# Patient Record
Sex: Male | Born: 1950 | Race: Black or African American | Hispanic: No | Marital: Married | Smoking: Current some day smoker
Health system: Southern US, Community
[De-identification: ages and names within clinical notes are randomized; demographics above are authoritative.]

## PROBLEM LIST (undated history)

## (undated) VITALS — BP 92/68 | HR 99 | Temp 97.0°F | Resp 18 | Ht 67.0 in | Wt 126.0 lb

## (undated) DIAGNOSIS — D649 Anemia, unspecified: Secondary | ICD-10-CM

## (undated) DIAGNOSIS — B1920 Unspecified viral hepatitis C without hepatic coma: Secondary | ICD-10-CM

## (undated) DIAGNOSIS — Z720 Tobacco use: Secondary | ICD-10-CM

## (undated) DIAGNOSIS — F1020 Alcohol dependence, uncomplicated: Secondary | ICD-10-CM

## (undated) DIAGNOSIS — J449 Chronic obstructive pulmonary disease, unspecified: Secondary | ICD-10-CM

## (undated) DIAGNOSIS — I739 Peripheral vascular disease, unspecified: Secondary | ICD-10-CM

## (undated) DIAGNOSIS — I2583 Coronary atherosclerosis due to lipid rich plaque: Secondary | ICD-10-CM

## (undated) DIAGNOSIS — I2510 Atherosclerotic heart disease of native coronary artery without angina pectoris: Secondary | ICD-10-CM

## (undated) DIAGNOSIS — F32A Depression, unspecified: Secondary | ICD-10-CM

## (undated) DIAGNOSIS — K7460 Unspecified cirrhosis of liver: Secondary | ICD-10-CM

## (undated) DIAGNOSIS — M1990 Unspecified osteoarthritis, unspecified site: Secondary | ICD-10-CM

## (undated) DIAGNOSIS — F329 Major depressive disorder, single episode, unspecified: Secondary | ICD-10-CM

## (undated) DIAGNOSIS — R0600 Dyspnea, unspecified: Secondary | ICD-10-CM

## (undated) DIAGNOSIS — K219 Gastro-esophageal reflux disease without esophagitis: Secondary | ICD-10-CM

## (undated) HISTORY — DX: Gastro-esophageal reflux disease without esophagitis: K21.9

## (undated) HISTORY — DX: Major depressive disorder, single episode, unspecified: F32.9

## (undated) HISTORY — PX: CARDIAC CATHETERIZATION: SHX172

## (undated) HISTORY — DX: Anemia, unspecified: D64.9

## (undated) HISTORY — DX: Tobacco use: Z72.0

## (undated) HISTORY — DX: Unspecified osteoarthritis, unspecified site: M19.90

## (undated) HISTORY — DX: Unspecified viral hepatitis C without hepatic coma: B19.20

## (undated) HISTORY — PX: COLONOSCOPY: SHX174

## (undated) HISTORY — DX: Atherosclerotic heart disease of native coronary artery without angina pectoris: I25.10

## (undated) HISTORY — DX: Depression, unspecified: F32.A

## (undated) HISTORY — DX: Chronic obstructive pulmonary disease, unspecified: J44.9

## (undated) HISTORY — DX: Coronary atherosclerosis due to lipid rich plaque: I25.83

---

## 2000-04-01 ENCOUNTER — Encounter: Payer: Self-pay | Admitting: Emergency Medicine

## 2000-04-01 ENCOUNTER — Emergency Department (HOSPITAL_COMMUNITY): Admission: EM | Admit: 2000-04-01 | Discharge: 2000-04-01 | Payer: Self-pay | Admitting: Emergency Medicine

## 2000-06-07 ENCOUNTER — Emergency Department (HOSPITAL_COMMUNITY): Admission: EM | Admit: 2000-06-07 | Discharge: 2000-06-07 | Payer: Self-pay | Admitting: Emergency Medicine

## 2000-06-07 ENCOUNTER — Encounter: Payer: Self-pay | Admitting: Emergency Medicine

## 2000-06-09 ENCOUNTER — Encounter: Admission: RE | Admit: 2000-06-09 | Discharge: 2000-06-09 | Payer: Self-pay | Admitting: Hematology and Oncology

## 2002-11-07 ENCOUNTER — Encounter: Payer: Self-pay | Admitting: Emergency Medicine

## 2002-11-07 ENCOUNTER — Emergency Department (HOSPITAL_COMMUNITY): Admission: EM | Admit: 2002-11-07 | Discharge: 2002-11-07 | Payer: Self-pay | Admitting: Emergency Medicine

## 2003-09-19 ENCOUNTER — Emergency Department (HOSPITAL_COMMUNITY): Admission: EM | Admit: 2003-09-19 | Discharge: 2003-09-19 | Payer: Self-pay | Admitting: Emergency Medicine

## 2004-02-28 ENCOUNTER — Emergency Department (HOSPITAL_COMMUNITY): Admission: EM | Admit: 2004-02-28 | Discharge: 2004-02-28 | Payer: Self-pay | Admitting: Emergency Medicine

## 2004-03-26 ENCOUNTER — Emergency Department (HOSPITAL_COMMUNITY): Admission: EM | Admit: 2004-03-26 | Discharge: 2004-03-27 | Payer: Self-pay | Admitting: Emergency Medicine

## 2006-06-13 ENCOUNTER — Emergency Department (HOSPITAL_COMMUNITY): Admission: EM | Admit: 2006-06-13 | Discharge: 2006-06-13 | Payer: Self-pay | Admitting: Emergency Medicine

## 2006-06-16 ENCOUNTER — Emergency Department (HOSPITAL_COMMUNITY): Admission: EM | Admit: 2006-06-16 | Discharge: 2006-06-16 | Payer: Self-pay | Admitting: Emergency Medicine

## 2006-10-06 ENCOUNTER — Emergency Department (HOSPITAL_COMMUNITY): Admission: EM | Admit: 2006-10-06 | Discharge: 2006-10-06 | Payer: Self-pay | Admitting: Emergency Medicine

## 2008-04-14 HISTORY — PX: CORONARY ARTERY BYPASS GRAFT: SHX141

## 2008-04-19 ENCOUNTER — Emergency Department (HOSPITAL_COMMUNITY): Admission: EM | Admit: 2008-04-19 | Discharge: 2008-04-19 | Payer: Self-pay | Admitting: Emergency Medicine

## 2008-04-28 ENCOUNTER — Ambulatory Visit: Payer: Self-pay | Admitting: Cardiothoracic Surgery

## 2008-04-28 ENCOUNTER — Inpatient Hospital Stay (HOSPITAL_COMMUNITY): Admission: EM | Admit: 2008-04-28 | Discharge: 2008-05-13 | Payer: Self-pay | Admitting: Cardiology

## 2008-05-03 ENCOUNTER — Ambulatory Visit: Payer: Self-pay | Admitting: Internal Medicine

## 2008-05-05 ENCOUNTER — Encounter: Payer: Self-pay | Admitting: Cardiothoracic Surgery

## 2008-06-02 ENCOUNTER — Ambulatory Visit: Payer: Self-pay | Admitting: Cardiothoracic Surgery

## 2008-06-02 ENCOUNTER — Encounter: Admission: RE | Admit: 2008-06-02 | Discharge: 2008-06-02 | Payer: Self-pay | Admitting: Cardiothoracic Surgery

## 2008-07-19 ENCOUNTER — Ambulatory Visit: Payer: Self-pay | Admitting: Cardiothoracic Surgery

## 2008-08-04 ENCOUNTER — Ambulatory Visit: Payer: Self-pay | Admitting: Cardiothoracic Surgery

## 2008-09-07 ENCOUNTER — Ambulatory Visit (HOSPITAL_COMMUNITY): Admission: RE | Admit: 2008-09-07 | Discharge: 2008-09-07 | Payer: Self-pay | Admitting: Cardiology

## 2009-03-28 ENCOUNTER — Emergency Department (HOSPITAL_COMMUNITY): Admission: EM | Admit: 2009-03-28 | Discharge: 2009-03-28 | Payer: Self-pay | Admitting: Emergency Medicine

## 2009-04-14 HISTORY — PX: DOPPLER ECHOCARDIOGRAPHY: SHX263

## 2009-05-07 ENCOUNTER — Inpatient Hospital Stay (HOSPITAL_COMMUNITY): Admission: EM | Admit: 2009-05-07 | Discharge: 2009-05-09 | Payer: Self-pay | Admitting: Emergency Medicine

## 2009-05-08 ENCOUNTER — Encounter (INDEPENDENT_AMBULATORY_CARE_PROVIDER_SITE_OTHER): Payer: Self-pay | Admitting: Internal Medicine

## 2009-07-08 ENCOUNTER — Emergency Department (HOSPITAL_COMMUNITY): Admission: EM | Admit: 2009-07-08 | Discharge: 2009-07-08 | Payer: Self-pay | Admitting: Emergency Medicine

## 2009-10-18 ENCOUNTER — Emergency Department (HOSPITAL_COMMUNITY): Admission: EM | Admit: 2009-10-18 | Discharge: 2009-10-18 | Payer: Self-pay | Admitting: Emergency Medicine

## 2009-11-14 ENCOUNTER — Inpatient Hospital Stay (HOSPITAL_COMMUNITY): Admission: EM | Admit: 2009-11-14 | Discharge: 2009-11-16 | Payer: Self-pay | Admitting: Emergency Medicine

## 2009-11-14 ENCOUNTER — Ambulatory Visit: Payer: Self-pay | Admitting: Internal Medicine

## 2009-11-15 ENCOUNTER — Ambulatory Visit: Payer: Self-pay | Admitting: Surgery

## 2009-11-15 ENCOUNTER — Encounter (INDEPENDENT_AMBULATORY_CARE_PROVIDER_SITE_OTHER): Payer: Self-pay | Admitting: Internal Medicine

## 2009-11-15 ENCOUNTER — Ambulatory Visit: Payer: Self-pay | Admitting: Cardiology

## 2009-11-19 LAB — CBC WITH DIFFERENTIAL/PLATELET
BASO%: 0.7 % (ref 0.0–2.0)
Basophils Absolute: 0 10*3/uL (ref 0.0–0.1)
EOS%: 4 % (ref 0.0–7.0)
Eosinophils Absolute: 0.2 10*3/uL (ref 0.0–0.5)
HCT: 36.6 % — ABNORMAL LOW (ref 38.4–49.9)
HGB: 12.9 g/dL — ABNORMAL LOW (ref 13.0–17.1)
LYMPH%: 36.7 % (ref 14.0–49.0)
MCH: 39.2 pg — ABNORMAL HIGH (ref 27.2–33.4)
MCHC: 35.1 g/dL (ref 32.0–36.0)
MCV: 111.5 fL — ABNORMAL HIGH (ref 79.3–98.0)
MONO#: 0.6 10*3/uL (ref 0.1–0.9)
MONO%: 13.9 % (ref 0.0–14.0)
NEUT#: 1.9 10*3/uL (ref 1.5–6.5)
NEUT%: 44.7 % (ref 39.0–75.0)
Platelets: 52 10*3/uL — ABNORMAL LOW (ref 140–400)
RBC: 3.28 10*6/uL — ABNORMAL LOW (ref 4.20–5.82)
RDW: 13.1 % (ref 11.0–14.6)
WBC: 4.3 10*3/uL (ref 4.0–10.3)
lymph#: 1.6 10*3/uL (ref 0.9–3.3)

## 2009-11-21 LAB — COMPREHENSIVE METABOLIC PANEL
ALT: 54 U/L — ABNORMAL HIGH (ref 0–53)
AST: 84 U/L — ABNORMAL HIGH (ref 0–37)
Albumin: 3.9 g/dL (ref 3.5–5.2)
Alkaline Phosphatase: 139 U/L — ABNORMAL HIGH (ref 39–117)
BUN: 6 mg/dL (ref 6–23)
CO2: 26 mEq/L (ref 19–32)
Calcium: 9.5 mg/dL (ref 8.4–10.5)
Chloride: 103 mEq/L (ref 96–112)
Creatinine, Ser: 0.7 mg/dL (ref 0.40–1.50)
Glucose, Bld: 92 mg/dL (ref 70–99)
Potassium: 4.3 mEq/L (ref 3.5–5.3)
Sodium: 138 mEq/L (ref 135–145)
Total Bilirubin: 1.1 mg/dL (ref 0.3–1.2)
Total Protein: 7.6 g/dL (ref 6.0–8.3)

## 2009-11-21 LAB — FOLATE: Folate: >20 ng/mL

## 2009-11-21 LAB — PROTEIN ELECTROPHORESIS, SERUM
Albumin ELP: 48.4 % — ABNORMAL LOW (ref 55.8–66.1)
Alpha-1-Globulin: 4 % (ref 2.9–4.9)
Alpha-2-Globulin: 11.6 % (ref 7.1–11.8)
Beta 2: 5.7 % (ref 3.2–6.5)
Beta Globulin: 5.1 % (ref 4.7–7.2)
Gamma Globulin: 25.2 % — ABNORMAL HIGH (ref 11.1–18.8)
Total Protein, Serum Electrophoresis: 7.6 g/dL (ref 6.0–8.3)

## 2009-11-21 LAB — IRON AND TIBC
%SAT: 47 % (ref 20–55)
Iron: 140 ug/dL (ref 42–165)
TIBC: 295 ug/dL (ref 215–435)
UIBC: 155 ug/dL

## 2009-11-21 LAB — LACTATE DEHYDROGENASE: LDH: 195 U/L (ref 94–250)

## 2009-11-21 LAB — FERRITIN: Ferritin: 1084 ng/mL — ABNORMAL HIGH (ref 22–322)

## 2009-11-21 LAB — VITAMIN B12: Vitamin B-12: 566 pg/mL (ref 211–911)

## 2010-03-14 ENCOUNTER — Ambulatory Visit: Payer: Self-pay | Admitting: Gastroenterology

## 2010-04-14 HISTORY — PX: NM MYOVIEW LTD: HXRAD82

## 2010-04-18 ENCOUNTER — Ambulatory Visit
Admission: RE | Admit: 2010-04-18 | Discharge: 2010-04-18 | Payer: Self-pay | Source: Home / Self Care | Attending: Gastroenterology | Admitting: Gastroenterology

## 2010-04-18 DIAGNOSIS — B182 Chronic viral hepatitis C: Secondary | ICD-10-CM

## 2010-04-18 DIAGNOSIS — Z23 Encounter for immunization: Secondary | ICD-10-CM

## 2010-04-18 DIAGNOSIS — K7030 Alcoholic cirrhosis of liver without ascites: Secondary | ICD-10-CM

## 2010-05-20 ENCOUNTER — Ambulatory Visit
Admission: RE | Admit: 2010-05-20 | Discharge: 2010-05-20 | Disposition: A | Discharge status: Home / Self Care | Payer: Self-pay | Source: Ambulatory Visit | Attending: Gastroenterology | Admitting: Gastroenterology

## 2010-05-20 ENCOUNTER — Other Ambulatory Visit: Payer: Self-pay | Admitting: Gastroenterology

## 2010-05-20 DIAGNOSIS — K7460 Unspecified cirrhosis of liver: Secondary | ICD-10-CM

## 2010-05-20 DIAGNOSIS — B1920 Unspecified viral hepatitis C without hepatic coma: Secondary | ICD-10-CM

## 2010-05-20 MED ORDER — GADOBENATE DIMEGLUMINE 529 MG/ML IV SOLN
14.0000 mL | Freq: Once | INTRAVENOUS | Status: AC | PRN
  Administered 2010-05-20: 14 mL via INTRAVENOUS

## 2010-06-28 LAB — GAMMA GT: GGT: 277 U/L — ABNORMAL HIGH (ref 7–51)

## 2010-06-28 LAB — DIFFERENTIAL
Basophils Absolute: 0 10*3/uL (ref 0.0–0.1)
Basophils Absolute: 0 10*3/uL (ref 0.0–0.1)
Basophils Relative: 0 % (ref 0–1)
Basophils Relative: 1 % (ref 0–1)
Eosinophils Absolute: 0.1 10*3/uL (ref 0.0–0.7)
Eosinophils Absolute: 0.1 10*3/uL (ref 0.0–0.7)
Eosinophils Relative: 3 % (ref 0–5)
Eosinophils Relative: 4 % (ref 0–5)
Lymphocytes Relative: 40 % (ref 12–46)
Lymphocytes Relative: 44 % (ref 12–46)
Lymphs Abs: 1.4 10*3/uL (ref 0.7–4.0)
Lymphs Abs: 1.6 10*3/uL (ref 0.7–4.0)
Monocytes Absolute: 0.4 10*3/uL (ref 0.1–1.0)
Monocytes Absolute: 0.4 10*3/uL (ref 0.1–1.0)
Monocytes Relative: 12 % (ref 3–12)
Monocytes Relative: 12 % (ref 3–12)
Neutro Abs: 1.4 10*3/uL — ABNORMAL LOW (ref 1.7–7.7)
Neutro Abs: 1.5 10*3/uL — ABNORMAL LOW (ref 1.7–7.7)
Neutrophils Relative %: 40 % — ABNORMAL LOW (ref 43–77)
Neutrophils Relative %: 44 % (ref 43–77)

## 2010-06-28 LAB — COMPREHENSIVE METABOLIC PANEL
ALT: 50 U/L (ref 0–53)
ALT: 53 U/L (ref 0–53)
AST: 89 U/L — ABNORMAL HIGH (ref 0–37)
AST: 99 U/L — ABNORMAL HIGH (ref 0–37)
Albumin: 2.8 g/dL — ABNORMAL LOW (ref 3.5–5.2)
Albumin: 2.9 g/dL — ABNORMAL LOW (ref 3.5–5.2)
Alkaline Phosphatase: 107 U/L (ref 39–117)
Alkaline Phosphatase: 122 U/L — ABNORMAL HIGH (ref 39–117)
BUN: 2 mg/dL — ABNORMAL LOW (ref 6–23)
BUN: 3 mg/dL — ABNORMAL LOW (ref 6–23)
CO2: 27 mEq/L (ref 19–32)
CO2: 28 mEq/L (ref 19–32)
Calcium: 8.4 mg/dL (ref 8.4–10.5)
Calcium: 8.4 mg/dL (ref 8.4–10.5)
Chloride: 100 mEq/L (ref 96–112)
Chloride: 105 mEq/L (ref 96–112)
Creatinine, Ser: 0.63 mg/dL (ref 0.4–1.5)
Creatinine, Ser: 0.63 mg/dL (ref 0.4–1.5)
GFR calc Af Amer: >60 mL/min (ref >60)
GFR calc Af Amer: >60 mL/min (ref >60)
GFR calc non Af Amer: >60 mL/min (ref >60)
GFR calc non Af Amer: >60 mL/min (ref >60)
Glucose, Bld: 109 mg/dL — ABNORMAL HIGH (ref 70–99)
Glucose, Bld: 111 mg/dL — ABNORMAL HIGH (ref 70–99)
Potassium: 4 mEq/L (ref 3.5–5.1)
Potassium: 4.1 mEq/L (ref 3.5–5.1)
Sodium: 134 mEq/L — ABNORMAL LOW (ref 135–145)
Sodium: 138 mEq/L (ref 135–145)
Total Bilirubin: 1.2 mg/dL (ref 0.3–1.2)
Total Bilirubin: 1.3 mg/dL — ABNORMAL HIGH (ref 0.3–1.2)
Total Protein: 6.5 g/dL (ref 6.0–8.3)
Total Protein: 6.7 g/dL (ref 6.0–8.3)

## 2010-06-28 LAB — MAGNESIUM
Magnesium: 1.1 mg/dL — ABNORMAL LOW (ref 1.5–2.5)
Magnesium: 1.6 mg/dL (ref 1.5–2.5)

## 2010-06-28 LAB — CARDIAC PANEL(CRET KIN+CKTOT+MB+TROPI)
CK, MB: 1.2 ng/mL (ref 0.3–4.0)
CK, MB: 1.2 ng/mL (ref 0.3–4.0)
Relative Index: 1 (ref 0.0–2.5)
Relative Index: 1 (ref 0.0–2.5)
Total CK: 116 U/L (ref 7–232)
Total CK: 125 U/L (ref 7–232)
Troponin I: 0.01 ng/mL (ref 0.00–0.06)
Troponin I: 0.01 ng/mL (ref 0.00–0.06)

## 2010-06-28 LAB — BASIC METABOLIC PANEL
BUN: 2 mg/dL — ABNORMAL LOW (ref 6–23)
CO2: 23 mEq/L (ref 19–32)
Calcium: 8.7 mg/dL (ref 8.4–10.5)
Chloride: 103 mEq/L (ref 96–112)
Creatinine, Ser: 0.62 mg/dL (ref 0.4–1.5)
GFR calc Af Amer: >60 mL/min (ref >60)
GFR calc non Af Amer: >60 mL/min (ref >60)
Glucose, Bld: 105 mg/dL — ABNORMAL HIGH (ref 70–99)
Potassium: 3.4 mEq/L — ABNORMAL LOW (ref 3.5–5.1)
Sodium: 136 mEq/L (ref 135–145)

## 2010-06-28 LAB — CBC
HCT: 34.4 % — ABNORMAL LOW (ref 39.0–52.0)
HCT: 36.2 % — ABNORMAL LOW (ref 39.0–52.0)
HCT: 39.2 % (ref 39.0–52.0)
Hemoglobin: 12.4 g/dL — ABNORMAL LOW (ref 13.0–17.0)
Hemoglobin: 12.8 g/dL — ABNORMAL LOW (ref 13.0–17.0)
Hemoglobin: 14 g/dL (ref 13.0–17.0)
MCH: 37.6 pg — ABNORMAL HIGH (ref 26.0–34.0)
MCH: 37.7 pg — ABNORMAL HIGH (ref 26.0–34.0)
MCH: 38 pg — ABNORMAL HIGH (ref 26.0–34.0)
MCHC: 35.4 g/dL (ref 30.0–36.0)
MCHC: 35.7 g/dL (ref 30.0–36.0)
MCHC: 36 g/dL (ref 30.0–36.0)
MCV: 104.6 fL — ABNORMAL HIGH (ref 78.0–100.0)
MCV: 105.4 fL — ABNORMAL HIGH (ref 78.0–100.0)
MCV: 107.4 fL — ABNORMAL HIGH (ref 78.0–100.0)
Platelets: 45 10*3/uL — ABNORMAL LOW (ref 150–400)
Platelets: 47 10*3/uL — ABNORMAL LOW (ref 150–400)
Platelets: 59 10*3/uL — ABNORMAL LOW (ref 150–400)
RBC: 3.29 MIL/uL — ABNORMAL LOW (ref 4.22–5.81)
RBC: 3.37 MIL/uL — ABNORMAL LOW (ref 4.22–5.81)
RBC: 3.72 MIL/uL — ABNORMAL LOW (ref 4.22–5.81)
RDW: 12.2 % (ref 11.5–15.5)
RDW: 12.7 % (ref 11.5–15.5)
RDW: 12.7 % (ref 11.5–15.5)
WBC: 3 10*3/uL — ABNORMAL LOW (ref 4.0–10.5)
WBC: 3.4 10*3/uL — ABNORMAL LOW (ref 4.0–10.5)
WBC: 3.5 10*3/uL — ABNORMAL LOW (ref 4.0–10.5)

## 2010-06-28 LAB — HEPATIC FUNCTION PANEL
ALT: 62 U/L — ABNORMAL HIGH (ref 0–53)
AST: 125 U/L — ABNORMAL HIGH (ref 0–37)
Albumin: 3.1 g/dL — ABNORMAL LOW (ref 3.5–5.2)
Alkaline Phosphatase: 111 U/L (ref 39–117)
Bilirubin, Direct: 0.5 mg/dL — ABNORMAL HIGH (ref 0.0–0.3)
Indirect Bilirubin: 1.1 mg/dL — ABNORMAL HIGH (ref 0.3–0.9)
Total Bilirubin: 1.6 mg/dL — ABNORMAL HIGH (ref 0.3–1.2)
Total Protein: 7.1 g/dL (ref 6.0–8.3)

## 2010-06-28 LAB — CK TOTAL AND CKMB (NOT AT ARMC)
CK, MB: 1.2 ng/mL (ref 0.3–4.0)
Relative Index: 0.8 (ref 0.0–2.5)
Total CK: 155 U/L (ref 7–232)

## 2010-06-28 LAB — VITAMIN B12: Vitamin B-12: 541 pg/mL (ref 211–911)

## 2010-06-28 LAB — TSH: TSH: 1.386 u[IU]/mL (ref 0.350–4.500)

## 2010-06-28 LAB — ETHANOL: Alcohol, Ethyl (B): 175 mg/dL — ABNORMAL HIGH (ref 0–10)

## 2010-06-28 LAB — TROPONIN I: Troponin I: 0.01 ng/mL (ref 0.00–0.06)

## 2010-06-28 LAB — T4, FREE: Free T4: 0.83 ng/dL (ref 0.80–1.80)

## 2010-06-28 LAB — FOLATE RBC: RBC Folate: 636 ng/mL — ABNORMAL HIGH (ref 180–600)

## 2010-06-28 LAB — T3, FREE: T3, Free: 2.8 pg/mL (ref 2.3–4.2)

## 2010-06-30 LAB — CBC
HCT: 37.7 % — ABNORMAL LOW (ref 39.0–52.0)
HCT: 38.1 % — ABNORMAL LOW (ref 39.0–52.0)
HCT: 38.6 % — ABNORMAL LOW (ref 39.0–52.0)
HCT: 40 % (ref 39.0–52.0)
Hemoglobin: 13.2 g/dL (ref 13.0–17.0)
Hemoglobin: 13.4 g/dL (ref 13.0–17.0)
Hemoglobin: 13.6 g/dL (ref 13.0–17.0)
Hemoglobin: 14.4 g/dL (ref 13.0–17.0)
MCH: 38.6 pg — ABNORMAL HIGH (ref 26.0–34.0)
MCHC: 35 g/dL (ref 30.0–36.0)
MCHC: 34.8 g/dL (ref 30.0–36.0)
MCHC: 35.7 g/dL (ref 30.0–36.0)
MCHC: 36 g/dL (ref 30.0–36.0)
MCV: 110.4 fL — ABNORMAL HIGH (ref 78.0–100.0)
MCV: 105.3 fL — ABNORMAL HIGH (ref 78.0–100.0)
MCV: 105.9 fL — ABNORMAL HIGH (ref 78.0–100.0)
MCV: 108.6 fL — ABNORMAL HIGH (ref 78.0–100.0)
Platelets: 42 10*3/uL — ABNORMAL LOW (ref 150–400)
Platelets: 36 10*3/uL — ABNORMAL LOW (ref 150–400)
Platelets: 37 10*3/uL — ABNORMAL LOW (ref 150–400)
Platelets: 39 10*3/uL — ABNORMAL LOW (ref 150–400)
RBC: 3.41 MIL/uL — ABNORMAL LOW (ref 4.22–5.81)
RBC: 3.56 MIL/uL — ABNORMAL LOW (ref 4.22–5.81)
RBC: 3.6 MIL/uL — ABNORMAL LOW (ref 4.22–5.81)
RBC: 3.79 MIL/uL — ABNORMAL LOW (ref 4.22–5.81)
RDW: 13.8 % (ref 11.5–15.5)
RDW: 12.9 % (ref 11.5–15.5)
RDW: 13 % (ref 11.5–15.5)
RDW: 13 % (ref 11.5–15.5)
WBC: 3.4 10*3/uL — ABNORMAL LOW (ref 4.0–10.5)
WBC: 3.6 10*3/uL — ABNORMAL LOW (ref 4.0–10.5)
WBC: 3.6 10*3/uL — ABNORMAL LOW (ref 4.0–10.5)
WBC: 3.7 10*3/uL — ABNORMAL LOW (ref 4.0–10.5)

## 2010-06-30 LAB — BASIC METABOLIC PANEL
BUN: 1 mg/dL — ABNORMAL LOW (ref 6–23)
BUN: 1 mg/dL — ABNORMAL LOW (ref 6–23)
BUN: 3 mg/dL — ABNORMAL LOW (ref 6–23)
CO2: 22 mEq/L (ref 19–32)
CO2: 24 mEq/L (ref 19–32)
CO2: 28 mEq/L (ref 19–32)
Calcium: 8.4 mg/dL (ref 8.4–10.5)
Calcium: 7.9 mg/dL — ABNORMAL LOW (ref 8.4–10.5)
Calcium: 8.1 mg/dL — ABNORMAL LOW (ref 8.4–10.5)
Chloride: 103 mEq/L (ref 96–112)
Chloride: 101 mEq/L (ref 96–112)
Chloride: 103 mEq/L (ref 96–112)
Creatinine, Ser: 0.67 mg/dL (ref 0.4–1.5)
Creatinine, Ser: 0.68 mg/dL (ref 0.4–1.5)
Creatinine, Ser: 0.74 mg/dL (ref 0.4–1.5)
GFR calc Af Amer: >60 mL/min (ref >60)
GFR calc Af Amer: >60 mL/min (ref >60)
GFR calc Af Amer: >60 mL/min (ref >60)
GFR calc non Af Amer: >60 mL/min (ref >60)
GFR calc non Af Amer: >60 mL/min (ref >60)
GFR calc non Af Amer: >60 mL/min (ref >60)
Glucose, Bld: 109 mg/dL — ABNORMAL HIGH (ref 70–99)
Glucose, Bld: 107 mg/dL — ABNORMAL HIGH (ref 70–99)
Glucose, Bld: 119 mg/dL — ABNORMAL HIGH (ref 70–99)
Potassium: 3.4 mEq/L — ABNORMAL LOW (ref 3.5–5.1)
Potassium: 3.1 mEq/L — ABNORMAL LOW (ref 3.5–5.1)
Potassium: 3.7 mEq/L (ref 3.5–5.1)
Sodium: 136 mEq/L (ref 135–145)
Sodium: 134 mEq/L — ABNORMAL LOW (ref 135–145)
Sodium: 137 mEq/L (ref 135–145)

## 2010-06-30 LAB — DIFFERENTIAL
Basophils Absolute: 0 10*3/uL (ref 0.0–0.1)
Basophils Absolute: 0 10*3/uL (ref 0.0–0.1)
Basophils Absolute: 0 10*3/uL (ref 0.0–0.1)
Basophils Relative: 1 % (ref 0–1)
Basophils Relative: 0 % (ref 0–1)
Basophils Relative: 0 % (ref 0–1)
Eosinophils Absolute: 0.1 10*3/uL (ref 0.0–0.7)
Eosinophils Absolute: 0 10*3/uL (ref 0.0–0.7)
Eosinophils Absolute: 0.1 10*3/uL (ref 0.0–0.7)
Eosinophils Relative: 4 % (ref 0–5)
Eosinophils Relative: 1 % (ref 0–5)
Eosinophils Relative: 3 % (ref 0–5)
Lymphocytes Relative: 37 % (ref 12–46)
Lymphocytes Relative: 35 % (ref 12–46)
Lymphocytes Relative: 40 % (ref 12–46)
Lymphs Abs: 1.3 10*3/uL (ref 0.7–4.0)
Lymphs Abs: 1.3 10*3/uL (ref 0.7–4.0)
Lymphs Abs: 1.4 10*3/uL (ref 0.7–4.0)
Monocytes Absolute: 0.3 10*3/uL (ref 0.1–1.0)
Monocytes Absolute: 0.4 10*3/uL (ref 0.1–1.0)
Monocytes Absolute: 0.5 10*3/uL (ref 0.1–1.0)
Monocytes Relative: 10 % (ref 3–12)
Monocytes Relative: 12 % (ref 3–12)
Monocytes Relative: 13 % — ABNORMAL HIGH (ref 3–12)
Neutro Abs: 1.7 10*3/uL (ref 1.7–7.7)
Neutro Abs: 1.6 10*3/uL — ABNORMAL LOW (ref 1.7–7.7)
Neutro Abs: 1.9 10*3/uL (ref 1.7–7.7)
Neutrophils Relative %: 48 % (ref 43–77)
Neutrophils Relative %: 44 % (ref 43–77)
Neutrophils Relative %: 51 % (ref 43–77)

## 2010-06-30 LAB — CARDIAC PANEL(CRET KIN+CKTOT+MB+TROPI)
CK, MB: 1.1 ng/mL (ref 0.3–4.0)
CK, MB: 1.3 ng/mL (ref 0.3–4.0)
CK, MB: 1.4 ng/mL (ref 0.3–4.0)
Relative Index: 0.1 (ref 0.0–2.5)
Relative Index: 0.1 (ref 0.0–2.5)
Relative Index: 0.1 (ref 0.0–2.5)
Total CK: 1530 U/L — ABNORMAL HIGH (ref 7–232)
Total CK: 2016 U/L — ABNORMAL HIGH (ref 7–232)
Total CK: 2182 U/L — ABNORMAL HIGH (ref 7–232)
Troponin I: 0.01 ng/mL (ref 0.00–0.06)
Troponin I: 0.02 ng/mL (ref 0.00–0.06)
Troponin I: 0.02 ng/mL (ref 0.00–0.06)

## 2010-06-30 LAB — LIPID PANEL
Cholesterol: 124 mg/dL (ref 0–200)
HDL: 23 mg/dL — ABNORMAL LOW (ref >39)
LDL Cholesterol: 86 mg/dL (ref 0–99)
Total CHOL/HDL Ratio: 5.4 RATIO
Triglycerides: 73 mg/dL (ref <150)
VLDL: 15 mg/dL (ref 0–40)

## 2010-06-30 LAB — COMPREHENSIVE METABOLIC PANEL
ALT: 73 U/L — ABNORMAL HIGH (ref 0–53)
AST: 143 U/L — ABNORMAL HIGH (ref 0–37)
Albumin: 3 g/dL — ABNORMAL LOW (ref 3.5–5.2)
Alkaline Phosphatase: 84 U/L (ref 39–117)
BUN: 3 mg/dL — ABNORMAL LOW (ref 6–23)
CO2: 26 mEq/L (ref 19–32)
Calcium: 8.5 mg/dL (ref 8.4–10.5)
Chloride: 108 mEq/L (ref 96–112)
Creatinine, Ser: 0.89 mg/dL (ref 0.4–1.5)
GFR calc Af Amer: >60 mL/min (ref >60)
GFR calc non Af Amer: >60 mL/min (ref >60)
Glucose, Bld: 110 mg/dL — ABNORMAL HIGH (ref 70–99)
Potassium: 4.6 mEq/L (ref 3.5–5.1)
Sodium: 139 mEq/L (ref 135–145)
Total Bilirubin: 1.1 mg/dL (ref 0.3–1.2)
Total Protein: 6.7 g/dL (ref 6.0–8.3)

## 2010-06-30 LAB — CK TOTAL AND CKMB (NOT AT ARMC)
CK, MB: 1.3 ng/mL (ref 0.3–4.0)
Relative Index: 0.1 (ref 0.0–2.5)
Total CK: 2242 U/L — ABNORMAL HIGH (ref 7–232)

## 2010-06-30 LAB — URINALYSIS, ROUTINE W REFLEX MICROSCOPIC
Glucose, UA: 100 mg/dL — AB
Ketones, ur: 15 mg/dL — AB
Nitrite: NEGATIVE
Protein, ur: NEGATIVE mg/dL
Specific Gravity, Urine: 1.023 (ref 1.005–1.030)
Urobilinogen, UA: 4 mg/dL — ABNORMAL HIGH (ref 0.0–1.0)
pH: 6 (ref 5.0–8.0)

## 2010-06-30 LAB — POCT CARDIAC MARKERS
CKMB, poc: <1 ng/mL — ABNORMAL LOW (ref 1.0–8.0)
CKMB, poc: 1.2 ng/mL (ref 1.0–8.0)
Myoglobin, poc: 75.5 ng/mL (ref 12–200)
Myoglobin, poc: 269 ng/mL (ref 12–200)
Troponin i, poc: <0.05 ng/mL (ref 0.00–0.09)
Troponin i, poc: <0.05 ng/mL (ref 0.00–0.09)

## 2010-06-30 LAB — TYPE AND SCREEN
ABO/RH(D): AB POS
Antibody Screen: NEGATIVE

## 2010-06-30 LAB — PROTIME-INR
INR: 1.22 (ref 0.00–1.49)
Prothrombin Time: 15.3 seconds — ABNORMAL HIGH (ref 11.6–15.2)

## 2010-06-30 LAB — TROPONIN I: Troponin I: 0.03 ng/mL (ref 0.00–0.06)

## 2010-06-30 LAB — RAPID URINE DRUG SCREEN, HOSP PERFORMED
Amphetamines: NOT DETECTED
Barbiturates: NOT DETECTED
Benzodiazepines: NOT DETECTED
Cocaine: NOT DETECTED
Opiates: NOT DETECTED
Tetrahydrocannabinol: POSITIVE — AB

## 2010-06-30 LAB — URINE MICROSCOPIC-ADD ON

## 2010-06-30 LAB — ANTISTREPTOLYSIN O TITER: ASO: 69 IU/mL (ref 0–116)

## 2010-06-30 LAB — HEPATIC FUNCTION PANEL
ALT: 78 U/L — ABNORMAL HIGH (ref 0–53)
AST: 169 U/L — ABNORMAL HIGH (ref 0–37)
Albumin: 3 g/dL — ABNORMAL LOW (ref 3.5–5.2)
Alkaline Phosphatase: 75 U/L (ref 39–117)
Bilirubin, Direct: 0.4 mg/dL — ABNORMAL HIGH (ref 0.0–0.3)
Indirect Bilirubin: 0.7 mg/dL (ref 0.3–0.9)
Total Bilirubin: 1.1 mg/dL (ref 0.3–1.2)
Total Protein: 6.8 g/dL (ref 6.0–8.3)

## 2010-06-30 LAB — MAGNESIUM: Magnesium: 1.6 mg/dL (ref 1.5–2.5)

## 2010-06-30 LAB — FOLATE RBC: RBC Folate: 578 ng/mL (ref 180–600)

## 2010-06-30 LAB — HCV RNA QUANT
HCV Quantitative Log: 5.05 {Log} — ABNORMAL HIGH (ref <1.63)
HCV Quantitative: 111000 IU/mL — ABNORMAL HIGH (ref <43)

## 2010-06-30 LAB — ETHANOL: Alcohol, Ethyl (B): <5 mg/dL (ref 0–10)

## 2010-06-30 LAB — TSH: TSH: 3.283 u[IU]/mL (ref 0.350–4.500)

## 2010-06-30 LAB — PHOSPHORUS: Phosphorus: 3.3 mg/dL (ref 2.3–4.6)

## 2010-06-30 LAB — VITAMIN B12: Vitamin B-12: 386 pg/mL (ref 211–911)

## 2010-07-05 LAB — URINALYSIS, ROUTINE W REFLEX MICROSCOPIC
Bilirubin Urine: NEGATIVE
Glucose, UA: NEGATIVE mg/dL
Hgb urine dipstick: NEGATIVE
Ketones, ur: NEGATIVE mg/dL
Nitrite: NEGATIVE
Protein, ur: NEGATIVE mg/dL
Specific Gravity, Urine: 1.004 — ABNORMAL LOW (ref 1.005–1.030)
Urobilinogen, UA: 1 mg/dL (ref 0.0–1.0)
pH: 6 (ref 5.0–8.0)

## 2010-07-05 LAB — COMPREHENSIVE METABOLIC PANEL
ALT: 58 U/L — ABNORMAL HIGH (ref 0–53)
AST: 80 U/L — ABNORMAL HIGH (ref 0–37)
Albumin: 2.9 g/dL — ABNORMAL LOW (ref 3.5–5.2)
Alkaline Phosphatase: 72 U/L (ref 39–117)
BUN: 4 mg/dL — ABNORMAL LOW (ref 6–23)
CO2: 24 mEq/L (ref 19–32)
Calcium: 8.1 mg/dL — ABNORMAL LOW (ref 8.4–10.5)
Chloride: 104 mEq/L (ref 96–112)
Creatinine, Ser: 0.69 mg/dL (ref 0.4–1.5)
GFR calc Af Amer: >60 mL/min (ref >60)
GFR calc non Af Amer: >60 mL/min (ref >60)
Glucose, Bld: 109 mg/dL — ABNORMAL HIGH (ref 70–99)
Potassium: 3.9 mEq/L (ref 3.5–5.1)
Sodium: 135 mEq/L (ref 135–145)
Total Bilirubin: 1.4 mg/dL — ABNORMAL HIGH (ref 0.3–1.2)
Total Protein: 7.2 g/dL (ref 6.0–8.3)

## 2010-07-05 LAB — DIFFERENTIAL
Basophils Absolute: 0 10*3/uL (ref 0.0–0.1)
Basophils Relative: 0 % (ref 0–1)
Eosinophils Absolute: 0.1 10*3/uL (ref 0.0–0.7)
Eosinophils Relative: 1 % (ref 0–5)
Lymphocytes Relative: 23 % (ref 12–46)
Lymphs Abs: 1.3 10*3/uL (ref 0.7–4.0)
Monocytes Absolute: 1.1 10*3/uL — ABNORMAL HIGH (ref 0.1–1.0)
Monocytes Relative: 20 % — ABNORMAL HIGH (ref 3–12)
Neutro Abs: 3.1 10*3/uL (ref 1.7–7.7)
Neutrophils Relative %: 56 % (ref 43–77)

## 2010-07-05 LAB — CBC
HCT: 38.7 % — ABNORMAL LOW (ref 39.0–52.0)
Hemoglobin: 13.5 g/dL (ref 13.0–17.0)
MCHC: 34.9 g/dL (ref 30.0–36.0)
MCV: 110.4 fL — ABNORMAL HIGH (ref 78.0–100.0)
Platelets: 50 10*3/uL — ABNORMAL LOW (ref 150–400)
RBC: 3.51 MIL/uL — ABNORMAL LOW (ref 4.22–5.81)
RDW: 13.3 % (ref 11.5–15.5)
WBC: 5.6 10*3/uL (ref 4.0–10.5)

## 2010-07-05 LAB — LIPASE, BLOOD: Lipase: 62 U/L — ABNORMAL HIGH (ref 11–59)

## 2010-07-29 LAB — CBC
HCT: 37.8 % — ABNORMAL LOW (ref 39.0–52.0)
HCT: 39.7 % (ref 39.0–52.0)
HCT: 20 % — ABNORMAL LOW (ref 39.0–52.0)
HCT: 24.1 % — ABNORMAL LOW (ref 39.0–52.0)
HCT: 26.9 % — ABNORMAL LOW (ref 39.0–52.0)
HCT: 27.3 % — ABNORMAL LOW (ref 39.0–52.0)
HCT: 29 % — ABNORMAL LOW (ref 39.0–52.0)
HCT: 29.4 % — ABNORMAL LOW (ref 39.0–52.0)
HCT: 29.5 % — ABNORMAL LOW (ref 39.0–52.0)
HCT: 31.2 % — ABNORMAL LOW (ref 39.0–52.0)
HCT: 39 % (ref 39.0–52.0)
HCT: 39.4 % (ref 39.0–52.0)
HCT: 39.5 % (ref 39.0–52.0)
HCT: 40.1 % (ref 39.0–52.0)
HCT: 40.5 % (ref 39.0–52.0)
HCT: 41.4 % (ref 39.0–52.0)
HCT: 42.9 % (ref 39.0–52.0)
HCT: 43.1 % (ref 39.0–52.0)
HCT: 44.9 % (ref 39.0–52.0)
Hemoglobin: 13 g/dL (ref 13.0–17.0)
Hemoglobin: 14 g/dL (ref 13.0–17.0)
Hemoglobin: 10.1 g/dL — ABNORMAL LOW (ref 13.0–17.0)
Hemoglobin: 10.1 g/dL — ABNORMAL LOW (ref 13.0–17.0)
Hemoglobin: 10.1 g/dL — ABNORMAL LOW (ref 13.0–17.0)
Hemoglobin: 10.6 g/dL — ABNORMAL LOW (ref 13.0–17.0)
Hemoglobin: 13.6 g/dL (ref 13.0–17.0)
Hemoglobin: 13.8 g/dL (ref 13.0–17.0)
Hemoglobin: 13.8 g/dL (ref 13.0–17.0)
Hemoglobin: 13.9 g/dL (ref 13.0–17.0)
Hemoglobin: 13.9 g/dL (ref 13.0–17.0)
Hemoglobin: 14.8 g/dL (ref 13.0–17.0)
Hemoglobin: 14.8 g/dL (ref 13.0–17.0)
Hemoglobin: 14.9 g/dL (ref 13.0–17.0)
Hemoglobin: 15.4 g/dL (ref 13.0–17.0)
Hemoglobin: 7.1 g/dL — CL (ref 13.0–17.0)
Hemoglobin: 8.5 g/dL — ABNORMAL LOW (ref 13.0–17.0)
Hemoglobin: 9.4 g/dL — ABNORMAL LOW (ref 13.0–17.0)
Hemoglobin: 9.5 g/dL — ABNORMAL LOW (ref 13.0–17.0)
MCHC: 34.5 g/dL (ref 30.0–36.0)
MCHC: 35.2 g/dL (ref 30.0–36.0)
MCHC: 33.9 g/dL (ref 30.0–36.0)
MCHC: 34.1 g/dL (ref 30.0–36.0)
MCHC: 34.2 g/dL (ref 30.0–36.0)
MCHC: 34.3 g/dL (ref 30.0–36.0)
MCHC: 34.4 g/dL (ref 30.0–36.0)
MCHC: 34.4 g/dL (ref 30.0–36.0)
MCHC: 34.5 g/dL (ref 30.0–36.0)
MCHC: 34.7 g/dL (ref 30.0–36.0)
MCHC: 34.8 g/dL (ref 30.0–36.0)
MCHC: 34.8 g/dL (ref 30.0–36.0)
MCHC: 34.9 g/dL (ref 30.0–36.0)
MCHC: 34.9 g/dL (ref 30.0–36.0)
MCHC: 35.1 g/dL (ref 30.0–36.0)
MCHC: 35.4 g/dL (ref 30.0–36.0)
MCHC: 35.5 g/dL (ref 30.0–36.0)
MCHC: 35.7 g/dL (ref 30.0–36.0)
MCHC: 35.7 g/dL (ref 30.0–36.0)
MCV: 105.7 fL — ABNORMAL HIGH (ref 78.0–100.0)
MCV: 105.4 fL — ABNORMAL HIGH (ref 78.0–100.0)
MCV: 101.4 fL — ABNORMAL HIGH (ref 78.0–100.0)
MCV: 103.3 fL — ABNORMAL HIGH (ref 78.0–100.0)
MCV: 105.1 fL — ABNORMAL HIGH (ref 78.0–100.0)
MCV: 106.2 fL — ABNORMAL HIGH (ref 78.0–100.0)
MCV: 106.5 fL — ABNORMAL HIGH (ref 78.0–100.0)
MCV: 106.5 fL — ABNORMAL HIGH (ref 78.0–100.0)
MCV: 106.9 fL — ABNORMAL HIGH (ref 78.0–100.0)
MCV: 107 fL — ABNORMAL HIGH (ref 78.0–100.0)
MCV: 107.1 fL — ABNORMAL HIGH (ref 78.0–100.0)
MCV: 107.2 fL — ABNORMAL HIGH (ref 78.0–100.0)
MCV: 107.7 fL — ABNORMAL HIGH (ref 78.0–100.0)
MCV: 109.2 fL — ABNORMAL HIGH (ref 78.0–100.0)
MCV: 98.2 fL (ref 78.0–100.0)
MCV: 98.5 fL (ref 78.0–100.0)
MCV: 99.2 fL (ref 78.0–100.0)
MCV: 99.5 fL (ref 78.0–100.0)
MCV: 99.6 fL (ref 78.0–100.0)
Platelets: 60 10*3/uL — ABNORMAL LOW (ref 150–400)
Platelets: 50 10*3/uL — ABNORMAL LOW (ref 150–400)
Platelets: 104 10*3/uL — ABNORMAL LOW (ref 150–400)
Platelets: 119 10*3/uL — ABNORMAL LOW (ref 150–400)
Platelets: 50 10*3/uL — ABNORMAL LOW (ref 150–400)
Platelets: 50 10*3/uL — ABNORMAL LOW (ref 150–400)
Platelets: 53 10*3/uL — ABNORMAL LOW (ref 150–400)
Platelets: 57 10*3/uL — ABNORMAL LOW (ref 150–400)
Platelets: 57 10*3/uL — ABNORMAL LOW (ref 150–400)
Platelets: 63 10*3/uL — ABNORMAL LOW (ref 150–400)
Platelets: 64 10*3/uL — ABNORMAL LOW (ref 150–400)
Platelets: 73 10*3/uL — ABNORMAL LOW (ref 150–400)
Platelets: 74 10*3/uL — ABNORMAL LOW (ref 150–400)
Platelets: 80 10*3/uL — ABNORMAL LOW (ref 150–400)
Platelets: 82 10*3/uL — ABNORMAL LOW (ref 150–400)
Platelets: 83 10*3/uL — ABNORMAL LOW (ref 150–400)
Platelets: 86 10*3/uL — ABNORMAL LOW (ref 150–400)
Platelets: 95 10*3/uL — ABNORMAL LOW (ref 150–400)
Platelets: 96 10*3/uL — ABNORMAL LOW (ref 150–400)
RBC: 3.58 MIL/uL — ABNORMAL LOW (ref 4.22–5.81)
RBC: 3.76 MIL/uL — ABNORMAL LOW (ref 4.22–5.81)
RBC: 1.94 MIL/uL — ABNORMAL LOW (ref 4.22–5.81)
RBC: 2.38 MIL/uL — ABNORMAL LOW (ref 4.22–5.81)
RBC: 2.55 MIL/uL — ABNORMAL LOW (ref 4.22–5.81)
RBC: 2.73 MIL/uL — ABNORMAL LOW (ref 4.22–5.81)
RBC: 2.91 MIL/uL — ABNORMAL LOW (ref 4.22–5.81)
RBC: 2.96 MIL/uL — ABNORMAL LOW (ref 4.22–5.81)
RBC: 2.97 MIL/uL — ABNORMAL LOW (ref 4.22–5.81)
RBC: 3.18 MIL/uL — ABNORMAL LOW (ref 4.22–5.81)
RBC: 3.57 MIL/uL — ABNORMAL LOW (ref 4.22–5.81)
RBC: 3.67 MIL/uL — ABNORMAL LOW (ref 4.22–5.81)
RBC: 3.68 MIL/uL — ABNORMAL LOW (ref 4.22–5.81)
RBC: 3.77 MIL/uL — ABNORMAL LOW (ref 4.22–5.81)
RBC: 3.81 MIL/uL — ABNORMAL LOW (ref 4.22–5.81)
RBC: 3.89 MIL/uL — ABNORMAL LOW (ref 4.22–5.81)
RBC: 4.03 MIL/uL — ABNORMAL LOW (ref 4.22–5.81)
RBC: 4.04 MIL/uL — ABNORMAL LOW (ref 4.22–5.81)
RBC: 4.22 MIL/uL (ref 4.22–5.81)
RDW: 12.5 % (ref 11.5–15.5)
RDW: 12.7 % (ref 11.5–15.5)
RDW: 12.4 % (ref 11.5–15.5)
RDW: 12.6 % (ref 11.5–15.5)
RDW: 12.6 % (ref 11.5–15.5)
RDW: 12.6 % (ref 11.5–15.5)
RDW: 12.6 % (ref 11.5–15.5)
RDW: 12.6 % (ref 11.5–15.5)
RDW: 12.6 % (ref 11.5–15.5)
RDW: 12.7 % (ref 11.5–15.5)
RDW: 12.9 % (ref 11.5–15.5)
RDW: 12.9 % (ref 11.5–15.5)
RDW: 14.4 % (ref 11.5–15.5)
RDW: 14.6 % (ref 11.5–15.5)
RDW: 16.3 % — ABNORMAL HIGH (ref 11.5–15.5)
RDW: 16.8 % — ABNORMAL HIGH (ref 11.5–15.5)
RDW: 17.5 % — ABNORMAL HIGH (ref 11.5–15.5)
RDW: 17.9 % — ABNORMAL HIGH (ref 11.5–15.5)
RDW: 17.9 % — ABNORMAL HIGH (ref 11.5–15.5)
WBC: 4.1 10*3/uL (ref 4.0–10.5)
WBC: 2.9 10*3/uL — ABNORMAL LOW (ref 4.0–10.5)
WBC: 3.3 10*3/uL — ABNORMAL LOW (ref 4.0–10.5)
WBC: 3.3 10*3/uL — ABNORMAL LOW (ref 4.0–10.5)
WBC: 3.3 10*3/uL — ABNORMAL LOW (ref 4.0–10.5)
WBC: 3.6 10*3/uL — ABNORMAL LOW (ref 4.0–10.5)
WBC: 3.7 10*3/uL — ABNORMAL LOW (ref 4.0–10.5)
WBC: 4.2 10*3/uL (ref 4.0–10.5)
WBC: 4.4 10*3/uL (ref 4.0–10.5)
WBC: 4.4 10*3/uL (ref 4.0–10.5)
WBC: 4.5 10*3/uL (ref 4.0–10.5)
WBC: 4.6 10*3/uL (ref 4.0–10.5)
WBC: 4.7 10*3/uL (ref 4.0–10.5)
WBC: 4.8 10*3/uL (ref 4.0–10.5)
WBC: 4.8 10*3/uL (ref 4.0–10.5)
WBC: 5.6 10*3/uL (ref 4.0–10.5)
WBC: 6 10*3/uL (ref 4.0–10.5)
WBC: 6.5 10*3/uL (ref 4.0–10.5)
WBC: 8.6 10*3/uL (ref 4.0–10.5)

## 2010-07-29 LAB — HEPATIC FUNCTION PANEL
ALT: 110 U/L — ABNORMAL HIGH (ref 0–53)
ALT: 139 U/L — ABNORMAL HIGH (ref 0–53)
AST: 151 U/L — ABNORMAL HIGH (ref 0–37)
AST: 181 U/L — ABNORMAL HIGH (ref 0–37)
Albumin: 2.8 g/dL — ABNORMAL LOW (ref 3.5–5.2)
Albumin: 3.3 g/dL — ABNORMAL LOW (ref 3.5–5.2)
Alkaline Phosphatase: 96 U/L (ref 39–117)
Alkaline Phosphatase: 99 U/L (ref 39–117)
Bilirubin, Direct: 0.3 mg/dL (ref 0.0–0.3)
Bilirubin, Direct: 0.3 mg/dL (ref 0.0–0.3)
Indirect Bilirubin: 0.5 mg/dL (ref 0.3–0.9)
Indirect Bilirubin: 1.1 mg/dL — ABNORMAL HIGH (ref 0.3–0.9)
Total Bilirubin: 0.8 mg/dL (ref 0.3–1.2)
Total Bilirubin: 1.4 mg/dL — ABNORMAL HIGH (ref 0.3–1.2)
Total Protein: 6.9 g/dL (ref 6.0–8.3)
Total Protein: 7.3 g/dL (ref 6.0–8.3)

## 2010-07-29 LAB — BLOOD GAS, ARTERIAL
Acid-Base Excess: 1.5 mmol/L (ref 0.0–2.0)
Bicarbonate: 25.6 mEq/L — ABNORMAL HIGH (ref 20.0–24.0)
Drawn by: 275531
FIO2: 0.21 %
O2 Saturation: 96.6 %
Patient temperature: 98.6
TCO2: 26.9 mmol/L (ref 0–100)
pCO2 arterial: 41 mmHg (ref 35.0–45.0)
pH, Arterial: 7.412 (ref 7.350–7.450)
pO2, Arterial: 83.9 mmHg (ref 80.0–100.0)

## 2010-07-29 LAB — CROSSMATCH
ABO/RH(D): AB POS
Antibody Screen: NEGATIVE

## 2010-07-29 LAB — COMPREHENSIVE METABOLIC PANEL
ALT: 83 U/L — ABNORMAL HIGH (ref 0–53)
ALT: 106 U/L — ABNORMAL HIGH (ref 0–53)
ALT: 122 U/L — ABNORMAL HIGH (ref 0–53)
AST: 97 U/L — ABNORMAL HIGH (ref 0–37)
AST: 80 U/L — ABNORMAL HIGH (ref 0–37)
AST: 95 U/L — ABNORMAL HIGH (ref 0–37)
Albumin: 3.5 g/dL (ref 3.5–5.2)
Albumin: 3.1 g/dL — ABNORMAL LOW (ref 3.5–5.2)
Albumin: 3.5 g/dL (ref 3.5–5.2)
Alkaline Phosphatase: 116 U/L (ref 39–117)
Alkaline Phosphatase: 119 U/L — ABNORMAL HIGH (ref 39–117)
Alkaline Phosphatase: 135 U/L — ABNORMAL HIGH (ref 39–117)
BUN: 7 mg/dL (ref 6–23)
BUN: 6 mg/dL (ref 6–23)
BUN: 6 mg/dL (ref 6–23)
CO2: 28 mEq/L (ref 19–32)
CO2: 26 mEq/L (ref 19–32)
CO2: 29 mEq/L (ref 19–32)
Calcium: 9.4 mg/dL (ref 8.4–10.5)
Calcium: 9.3 mg/dL (ref 8.4–10.5)
Calcium: 9.5 mg/dL (ref 8.4–10.5)
Chloride: 103 mEq/L (ref 96–112)
Chloride: 103 mEq/L (ref 96–112)
Chloride: 104 mEq/L (ref 96–112)
Creatinine, Ser: 0.86 mg/dL (ref 0.4–1.5)
Creatinine, Ser: 0.71 mg/dL (ref 0.4–1.5)
Creatinine, Ser: 0.78 mg/dL (ref 0.4–1.5)
GFR calc Af Amer: >60 mL/min (ref >60)
GFR calc Af Amer: >60 mL/min (ref >60)
GFR calc Af Amer: >60 mL/min (ref >60)
GFR calc non Af Amer: >60 mL/min (ref >60)
GFR calc non Af Amer: >60 mL/min (ref >60)
GFR calc non Af Amer: >60 mL/min (ref >60)
Glucose, Bld: 116 mg/dL — ABNORMAL HIGH (ref 70–99)
Glucose, Bld: 116 mg/dL — ABNORMAL HIGH (ref 70–99)
Glucose, Bld: 116 mg/dL — ABNORMAL HIGH (ref 70–99)
Potassium: 4.2 mEq/L (ref 3.5–5.1)
Potassium: 4.4 mEq/L (ref 3.5–5.1)
Potassium: 4.4 mEq/L (ref 3.5–5.1)
Sodium: 140 mEq/L (ref 135–145)
Sodium: 137 mEq/L (ref 135–145)
Sodium: 140 mEq/L (ref 135–145)
Total Bilirubin: 1 mg/dL (ref 0.3–1.2)
Total Bilirubin: 0.6 mg/dL (ref 0.3–1.2)
Total Bilirubin: 1 mg/dL (ref 0.3–1.2)
Total Protein: 7 g/dL (ref 6.0–8.3)
Total Protein: 6.8 g/dL (ref 6.0–8.3)
Total Protein: 7.7 g/dL (ref 6.0–8.3)

## 2010-07-29 LAB — POCT I-STAT 3, ART BLOOD GAS (G3+)
Acid-base deficit: 1 mmol/L (ref 0.0–2.0)
Acid-base deficit: 1 mmol/L (ref 0.0–2.0)
Acid-base deficit: 3 mmol/L — ABNORMAL HIGH (ref 0.0–2.0)
Acid-base deficit: 3 mmol/L — ABNORMAL HIGH (ref 0.0–2.0)
Bicarbonate: 20.4 mEq/L (ref 20.0–24.0)
Bicarbonate: 21.6 mEq/L (ref 20.0–24.0)
Bicarbonate: 23.3 mEq/L (ref 20.0–24.0)
Bicarbonate: 23.4 mEq/L (ref 20.0–24.0)
Bicarbonate: 23.6 mEq/L (ref 20.0–24.0)
O2 Saturation: 100 %
O2 Saturation: 100 %
O2 Saturation: 100 %
O2 Saturation: 96 %
O2 Saturation: 98 %
Patient temperature: 101.5
Patient temperature: 30
Patient temperature: 34.1
Patient temperature: 37
Patient temperature: 38.7
TCO2: 21 mmol/L (ref 0–100)
TCO2: 23 mmol/L (ref 0–100)
TCO2: 24 mmol/L (ref 0–100)
TCO2: 24 mmol/L (ref 0–100)
TCO2: 25 mmol/L (ref 0–100)
pCO2 arterial: 27.6 mmHg — ABNORMAL LOW (ref 35.0–45.0)
pCO2 arterial: 29.7 mmHg — ABNORMAL LOW (ref 35.0–45.0)
pCO2 arterial: 32.4 mmHg — ABNORMAL LOW (ref 35.0–45.0)
pCO2 arterial: 37.6 mmHg (ref 35.0–45.0)
pCO2 arterial: 39 mmHg (ref 35.0–45.0)
pH, Arterial: 7.373 (ref 7.350–7.450)
pH, Arterial: 7.389 (ref 7.350–7.450)
pH, Arterial: 7.414 (ref 7.350–7.450)
pH, Arterial: 7.492 — ABNORMAL HIGH (ref 7.350–7.450)
pH, Arterial: 7.505 — ABNORMAL HIGH (ref 7.350–7.450)
pO2, Arterial: 263 mmHg — ABNORMAL HIGH (ref 80.0–100.0)
pO2, Arterial: 282 mmHg — ABNORMAL HIGH (ref 80.0–100.0)
pO2, Arterial: 407 mmHg — ABNORMAL HIGH (ref 80.0–100.0)
pO2, Arterial: 88 mmHg (ref 80.0–100.0)
pO2, Arterial: 94 mmHg (ref 80.0–100.0)

## 2010-07-29 LAB — GLUCOSE, CAPILLARY
Glucose-Capillary: 100 mg/dL — ABNORMAL HIGH (ref 70–99)
Glucose-Capillary: 108 mg/dL — ABNORMAL HIGH (ref 70–99)
Glucose-Capillary: 112 mg/dL — ABNORMAL HIGH (ref 70–99)
Glucose-Capillary: 115 mg/dL — ABNORMAL HIGH (ref 70–99)
Glucose-Capillary: 118 mg/dL — ABNORMAL HIGH (ref 70–99)
Glucose-Capillary: 118 mg/dL — ABNORMAL HIGH (ref 70–99)
Glucose-Capillary: 119 mg/dL — ABNORMAL HIGH (ref 70–99)
Glucose-Capillary: 126 mg/dL — ABNORMAL HIGH (ref 70–99)
Glucose-Capillary: 131 mg/dL — ABNORMAL HIGH (ref 70–99)
Glucose-Capillary: 133 mg/dL — ABNORMAL HIGH (ref 70–99)
Glucose-Capillary: 134 mg/dL — ABNORMAL HIGH (ref 70–99)
Glucose-Capillary: 136 mg/dL — ABNORMAL HIGH (ref 70–99)
Glucose-Capillary: 139 mg/dL — ABNORMAL HIGH (ref 70–99)
Glucose-Capillary: 143 mg/dL — ABNORMAL HIGH (ref 70–99)
Glucose-Capillary: 153 mg/dL — ABNORMAL HIGH (ref 70–99)
Glucose-Capillary: 163 mg/dL — ABNORMAL HIGH (ref 70–99)
Glucose-Capillary: 191 mg/dL — ABNORMAL HIGH (ref 70–99)
Glucose-Capillary: 211 mg/dL — ABNORMAL HIGH (ref 70–99)
Glucose-Capillary: 221 mg/dL — ABNORMAL HIGH (ref 70–99)
Glucose-Capillary: 39 mg/dL — CL (ref 70–99)

## 2010-07-29 LAB — BASIC METABOLIC PANEL
BUN: 6 mg/dL (ref 6–23)
BUN: 4 mg/dL — ABNORMAL LOW (ref 6–23)
BUN: 4 mg/dL — ABNORMAL LOW (ref 6–23)
BUN: 4 mg/dL — ABNORMAL LOW (ref 6–23)
BUN: 6 mg/dL (ref 6–23)
BUN: 8 mg/dL (ref 6–23)
BUN: 9 mg/dL (ref 6–23)
CO2: 23 mEq/L (ref 19–32)
CO2: 22 mEq/L (ref 19–32)
CO2: 24 mEq/L (ref 19–32)
CO2: 24 mEq/L (ref 19–32)
CO2: 26 mEq/L (ref 19–32)
CO2: 27 mEq/L (ref 19–32)
CO2: 27 mEq/L (ref 19–32)
Calcium: 9.1 mg/dL (ref 8.4–10.5)
Calcium: 7.5 mg/dL — ABNORMAL LOW (ref 8.4–10.5)
Calcium: 8.1 mg/dL — ABNORMAL LOW (ref 8.4–10.5)
Calcium: 8.1 mg/dL — ABNORMAL LOW (ref 8.4–10.5)
Calcium: 8.8 mg/dL (ref 8.4–10.5)
Calcium: 8.9 mg/dL (ref 8.4–10.5)
Calcium: 9.5 mg/dL (ref 8.4–10.5)
Chloride: 108 mEq/L (ref 96–112)
Chloride: 100 mEq/L (ref 96–112)
Chloride: 101 mEq/L (ref 96–112)
Chloride: 102 mEq/L (ref 96–112)
Chloride: 103 mEq/L (ref 96–112)
Chloride: 106 mEq/L (ref 96–112)
Chloride: 107 mEq/L (ref 96–112)
Creatinine, Ser: 0.65 mg/dL (ref 0.4–1.5)
Creatinine, Ser: 0.65 mg/dL (ref 0.4–1.5)
Creatinine, Ser: 0.68 mg/dL (ref 0.4–1.5)
Creatinine, Ser: 0.68 mg/dL (ref 0.4–1.5)
Creatinine, Ser: 0.76 mg/dL (ref 0.4–1.5)
Creatinine, Ser: 0.9 mg/dL (ref 0.4–1.5)
Creatinine, Ser: 0.92 mg/dL (ref 0.4–1.5)
GFR calc Af Amer: >60 mL/min (ref >60)
GFR calc Af Amer: >60 mL/min (ref >60)
GFR calc Af Amer: >60 mL/min (ref >60)
GFR calc Af Amer: >60 mL/min (ref >60)
GFR calc Af Amer: >60 mL/min (ref >60)
GFR calc Af Amer: >60 mL/min (ref >60)
GFR calc Af Amer: >60 mL/min (ref >60)
GFR calc non Af Amer: >60 mL/min (ref >60)
GFR calc non Af Amer: >60 mL/min (ref >60)
GFR calc non Af Amer: >60 mL/min (ref >60)
GFR calc non Af Amer: >60 mL/min (ref >60)
GFR calc non Af Amer: >60 mL/min (ref >60)
GFR calc non Af Amer: >60 mL/min (ref >60)
GFR calc non Af Amer: >60 mL/min (ref >60)
Glucose, Bld: 142 mg/dL — ABNORMAL HIGH (ref 70–99)
Glucose, Bld: 116 mg/dL — ABNORMAL HIGH (ref 70–99)
Glucose, Bld: 126 mg/dL — ABNORMAL HIGH (ref 70–99)
Glucose, Bld: 143 mg/dL — ABNORMAL HIGH (ref 70–99)
Glucose, Bld: 145 mg/dL — ABNORMAL HIGH (ref 70–99)
Glucose, Bld: 155 mg/dL — ABNORMAL HIGH (ref 70–99)
Glucose, Bld: 83 mg/dL (ref 70–99)
Potassium: 3.6 mEq/L (ref 3.5–5.1)
Potassium: 3.8 mEq/L (ref 3.5–5.1)
Potassium: 3.9 mEq/L (ref 3.5–5.1)
Potassium: 3.9 mEq/L (ref 3.5–5.1)
Potassium: 4 mEq/L (ref 3.5–5.1)
Potassium: 4.2 mEq/L (ref 3.5–5.1)
Potassium: 4.5 mEq/L (ref 3.5–5.1)
Sodium: 141 mEq/L (ref 135–145)
Sodium: 133 mEq/L — ABNORMAL LOW (ref 135–145)
Sodium: 133 mEq/L — ABNORMAL LOW (ref 135–145)
Sodium: 134 mEq/L — ABNORMAL LOW (ref 135–145)
Sodium: 136 mEq/L (ref 135–145)
Sodium: 139 mEq/L (ref 135–145)
Sodium: 140 mEq/L (ref 135–145)

## 2010-07-29 LAB — CREATININE, SERUM
Creatinine, Ser: 0.79 mg/dL (ref 0.4–1.5)
Creatinine, Ser: 0.93 mg/dL (ref 0.4–1.5)
GFR calc Af Amer: >60 mL/min (ref >60)
GFR calc Af Amer: >60 mL/min (ref >60)
GFR calc non Af Amer: >60 mL/min (ref >60)
GFR calc non Af Amer: >60 mL/min (ref >60)

## 2010-07-29 LAB — POCT I-STAT 4, (NA,K, GLUC, HGB,HCT)
Glucose, Bld: 100 mg/dL — ABNORMAL HIGH (ref 70–99)
Glucose, Bld: 124 mg/dL — ABNORMAL HIGH (ref 70–99)
Glucose, Bld: 133 mg/dL — ABNORMAL HIGH (ref 70–99)
Glucose, Bld: 88 mg/dL (ref 70–99)
Glucose, Bld: 92 mg/dL (ref 70–99)
Glucose, Bld: 92 mg/dL (ref 70–99)
HCT: 25 % — ABNORMAL LOW (ref 39.0–52.0)
HCT: 26 % — ABNORMAL LOW (ref 39.0–52.0)
HCT: 29 % — ABNORMAL LOW (ref 39.0–52.0)
HCT: 30 % — ABNORMAL LOW (ref 39.0–52.0)
HCT: 36 % — ABNORMAL LOW (ref 39.0–52.0)
HCT: 38 % — ABNORMAL LOW (ref 39.0–52.0)
Hemoglobin: 10.2 g/dL — ABNORMAL LOW (ref 13.0–17.0)
Hemoglobin: 12.2 g/dL — ABNORMAL LOW (ref 13.0–17.0)
Hemoglobin: 12.9 g/dL — ABNORMAL LOW (ref 13.0–17.0)
Hemoglobin: 8.5 g/dL — ABNORMAL LOW (ref 13.0–17.0)
Hemoglobin: 8.8 g/dL — ABNORMAL LOW (ref 13.0–17.0)
Hemoglobin: 9.9 g/dL — ABNORMAL LOW (ref 13.0–17.0)
Potassium: 3.8 mEq/L (ref 3.5–5.1)
Potassium: 4.1 mEq/L (ref 3.5–5.1)
Potassium: 5 mEq/L (ref 3.5–5.1)
Potassium: 6 mEq/L — ABNORMAL HIGH (ref 3.5–5.1)
Potassium: 7.3 mEq/L (ref 3.5–5.1)
Potassium: 7.3 mEq/L (ref 3.5–5.1)
Sodium: 134 mEq/L — ABNORMAL LOW (ref 135–145)
Sodium: 135 mEq/L (ref 135–145)
Sodium: 141 mEq/L (ref 135–145)
Sodium: 141 mEq/L (ref 135–145)
Sodium: 141 mEq/L (ref 135–145)
Sodium: 144 mEq/L (ref 135–145)

## 2010-07-29 LAB — PROTIME-INR
INR: 1.2 (ref 0.00–1.49)
INR: 1.1 (ref 0.00–1.49)
INR: 1.1 (ref 0.00–1.49)
INR: 1.6 — ABNORMAL HIGH (ref 0.00–1.49)
Prothrombin Time: 15.7 seconds — ABNORMAL HIGH (ref 11.6–15.2)
Prothrombin Time: 14.5 seconds (ref 11.6–15.2)
Prothrombin Time: 15 seconds (ref 11.6–15.2)
Prothrombin Time: 19.4 seconds — ABNORMAL HIGH (ref 11.6–15.2)

## 2010-07-29 LAB — POCT I-STAT, CHEM 8
BUN: 4 mg/dL — ABNORMAL LOW (ref 6–23)
BUN: 5 mg/dL — ABNORMAL LOW (ref 6–23)
BUN: 5 mg/dL — ABNORMAL LOW (ref 6–23)
Calcium, Ion: 1 mmol/L — ABNORMAL LOW (ref 1.12–1.32)
Calcium, Ion: 1.04 mmol/L — ABNORMAL LOW (ref 1.12–1.32)
Calcium, Ion: 1.13 mmol/L (ref 1.12–1.32)
Chloride: 101 mEq/L (ref 96–112)
Chloride: 107 mEq/L (ref 96–112)
Chloride: 108 mEq/L (ref 96–112)
Creatinine, Ser: 0.6 mg/dL (ref 0.4–1.5)
Creatinine, Ser: 0.6 mg/dL (ref 0.4–1.5)
Creatinine, Ser: 0.8 mg/dL (ref 0.4–1.5)
Glucose, Bld: 125 mg/dL — ABNORMAL HIGH (ref 70–99)
Glucose, Bld: 154 mg/dL — ABNORMAL HIGH (ref 70–99)
Glucose, Bld: 189 mg/dL — ABNORMAL HIGH (ref 70–99)
HCT: 24 % — ABNORMAL LOW (ref 39.0–52.0)
HCT: 25 % — ABNORMAL LOW (ref 39.0–52.0)
HCT: 32 % — ABNORMAL LOW (ref 39.0–52.0)
Hemoglobin: 10.9 g/dL — ABNORMAL LOW (ref 13.0–17.0)
Hemoglobin: 8.2 g/dL — ABNORMAL LOW (ref 13.0–17.0)
Hemoglobin: 8.5 g/dL — ABNORMAL LOW (ref 13.0–17.0)
Potassium: 3.7 mEq/L (ref 3.5–5.1)
Potassium: 3.8 mEq/L (ref 3.5–5.1)
Potassium: 4 mEq/L (ref 3.5–5.1)
Sodium: 140 mEq/L (ref 135–145)
Sodium: 144 mEq/L (ref 135–145)
Sodium: 146 mEq/L — ABNORMAL HIGH (ref 135–145)
TCO2: 23 mmol/L (ref 0–100)
TCO2: 23 mmol/L (ref 0–100)
TCO2: 26 mmol/L (ref 0–100)

## 2010-07-29 LAB — PREPARE RBC (CROSSMATCH)

## 2010-07-29 LAB — DIFFERENTIAL
Basophils Absolute: 0 10*3/uL (ref 0.0–0.1)
Basophils Absolute: 0 10*3/uL (ref 0.0–0.1)
Basophils Absolute: 0 10*3/uL (ref 0.0–0.1)
Basophils Absolute: 0 10*3/uL (ref 0.0–0.1)
Basophils Relative: 1 % (ref 0–1)
Basophils Relative: 1 % (ref 0–1)
Basophils Relative: 1 % (ref 0–1)
Basophils Relative: 1 % (ref 0–1)
Eosinophils Absolute: 0.2 10*3/uL (ref 0.0–0.7)
Eosinophils Absolute: 0.1 10*3/uL (ref 0.0–0.7)
Eosinophils Absolute: 0.2 10*3/uL (ref 0.0–0.7)
Eosinophils Absolute: 0.2 10*3/uL (ref 0.0–0.7)
Eosinophils Relative: 5 % (ref 0–5)
Eosinophils Relative: 3 % (ref 0–5)
Eosinophils Relative: 5 % (ref 0–5)
Eosinophils Relative: 6 % — ABNORMAL HIGH (ref 0–5)
Lymphocytes Relative: 39 % (ref 12–46)
Lymphocytes Relative: 47 % — ABNORMAL HIGH (ref 12–46)
Lymphocytes Relative: 36 % (ref 12–46)
Lymphocytes Relative: 40 % (ref 12–46)
Lymphs Abs: 1.6 10*3/uL (ref 0.7–4.0)
Lymphs Abs: 1.4 10*3/uL (ref 0.7–4.0)
Lymphs Abs: 1.2 10*3/uL (ref 0.7–4.0)
Lymphs Abs: 1.5 10*3/uL (ref 0.7–4.0)
Monocytes Absolute: 0.5 10*3/uL (ref 0.1–1.0)
Monocytes Absolute: 0.3 10*3/uL (ref 0.1–1.0)
Monocytes Absolute: 0.4 10*3/uL (ref 0.1–1.0)
Monocytes Absolute: 0.5 10*3/uL (ref 0.1–1.0)
Monocytes Relative: 13 % — ABNORMAL HIGH (ref 3–12)
Monocytes Relative: 10 % (ref 3–12)
Monocytes Relative: 13 % — ABNORMAL HIGH (ref 3–12)
Monocytes Relative: 13 % — ABNORMAL HIGH (ref 3–12)
Neutro Abs: 1.7 10*3/uL (ref 1.7–7.7)
Neutro Abs: 1.1 10*3/uL — ABNORMAL LOW (ref 1.7–7.7)
Neutro Abs: 1.4 10*3/uL — ABNORMAL LOW (ref 1.7–7.7)
Neutro Abs: 1.5 10*3/uL — ABNORMAL LOW (ref 1.7–7.7)
Neutrophils Relative %: 42 % — ABNORMAL LOW (ref 43–77)
Neutrophils Relative %: 39 % — ABNORMAL LOW (ref 43–77)
Neutrophils Relative %: 41 % — ABNORMAL LOW (ref 43–77)
Neutrophils Relative %: 44 % (ref 43–77)

## 2010-07-29 LAB — HEPATITIS PANEL, ACUTE
HCV Ab: REACTIVE — AB
Hep A IgM: NEGATIVE
Hep B C IgM: NEGATIVE
Hepatitis B Surface Ag: NEGATIVE

## 2010-07-29 LAB — TSH
TSH: 3.232 u[IU]/mL (ref 0.350–4.500)
TSH: 3.016 u[IU]/mL (ref 0.350–4.500)

## 2010-07-29 LAB — POCT I-STAT 3, VENOUS BLOOD GAS (G3P V)
Acid-base deficit: 4 mmol/L — ABNORMAL HIGH (ref 0.0–2.0)
Bicarbonate: 21.5 mEq/L (ref 20.0–24.0)
O2 Saturation: 80 %
Patient temperature: 30
TCO2: 23 mmol/L (ref 0–100)
pCO2, Ven: 29.2 mmHg — ABNORMAL LOW (ref 45.0–50.0)
pH, Ven: 7.443 — ABNORMAL HIGH (ref 7.250–7.300)
pO2, Ven: 29 mmHg — CL (ref 30.0–45.0)

## 2010-07-29 LAB — LIPID PANEL
Cholesterol: 136 mg/dL (ref 0–200)
HDL: 26 mg/dL — ABNORMAL LOW (ref >39)
LDL Cholesterol: 94 mg/dL (ref 0–99)
Total CHOL/HDL Ratio: 5.2 RATIO
Triglycerides: 80 mg/dL (ref <150)
VLDL: 16 mg/dL (ref 0–40)

## 2010-07-29 LAB — PREPARE FRESH FROZEN PLASMA

## 2010-07-29 LAB — LACTATE DEHYDROGENASE: LDH: 212 U/L (ref 94–250)

## 2010-07-29 LAB — DIC (DISSEMINATED INTRAVASCULAR COAGULATION) PANEL
D-Dimer, Quant: 0.29 ug/mL-FEU (ref 0.00–0.48)
Fibrinogen: 225 mg/dL (ref 204–475)
Prothrombin Time: 15.6 seconds — ABNORMAL HIGH (ref 11.6–15.2)

## 2010-07-29 LAB — CARDIAC PANEL(CRET KIN+CKTOT+MB+TROPI)
CK, MB: 1.2 ng/mL (ref 0.3–4.0)
Relative Index: INVALID (ref 0.0–2.5)
Total CK: 50 U/L (ref 7–232)
Troponin I: 0.01 ng/mL (ref 0.00–0.06)

## 2010-07-29 LAB — CK TOTAL AND CKMB (NOT AT ARMC)
CK, MB: 1.3 ng/mL (ref 0.3–4.0)
CK, MB: 1.3 ng/mL (ref 0.3–4.0)
Relative Index: 1.1 (ref 0.0–2.5)
Relative Index: 1.3 (ref 0.0–2.5)
Total CK: 120 U/L (ref 7–232)
Total CK: 103 U/L (ref 7–232)

## 2010-07-29 LAB — HEPARIN LEVEL (UNFRACTIONATED)
Heparin Unfractionated: <0.1 IU/mL — ABNORMAL LOW (ref 0.30–0.70)
Heparin Unfractionated: <0.1 IU/mL — ABNORMAL LOW (ref 0.30–0.70)
Heparin Unfractionated: <0.1 IU/mL — ABNORMAL LOW (ref 0.30–0.70)

## 2010-07-29 LAB — RAPID URINE DRUG SCREEN, HOSP PERFORMED
Amphetamines: NOT DETECTED
Barbiturates: NOT DETECTED
Benzodiazepines: NOT DETECTED
Cocaine: NOT DETECTED
Opiates: NOT DETECTED
Tetrahydrocannabinol: POSITIVE — AB

## 2010-07-29 LAB — ETHANOL: Alcohol, Ethyl (B): 166 mg/dL — ABNORMAL HIGH (ref 0–10)

## 2010-07-29 LAB — DIC (DISSEMINATED INTRAVASCULAR COAGULATION)PANEL
INR: 1.2 (ref 0.00–1.49)
Platelets: 56 10*3/uL — ABNORMAL LOW (ref 150–400)
Smear Review: NONE SEEN
aPTT: 30 seconds (ref 24–37)

## 2010-07-29 LAB — PREPARE PLATELETS

## 2010-07-29 LAB — URINALYSIS, ROUTINE W REFLEX MICROSCOPIC
Bilirubin Urine: NEGATIVE
Glucose, UA: NEGATIVE mg/dL
Hgb urine dipstick: NEGATIVE
Ketones, ur: NEGATIVE mg/dL
Nitrite: NEGATIVE
Protein, ur: NEGATIVE mg/dL
Specific Gravity, Urine: 1.006 (ref 1.005–1.030)
Urobilinogen, UA: 1 mg/dL (ref 0.0–1.0)
pH: 6.5 (ref 5.0–8.0)

## 2010-07-29 LAB — APTT
aPTT: 30 seconds (ref 24–37)
aPTT: 30 seconds (ref 24–37)
aPTT: 32 seconds (ref 24–37)
aPTT: 40 seconds — ABNORMAL HIGH (ref 24–37)

## 2010-07-29 LAB — POCT CARDIAC MARKERS
CKMB, poc: 2.3 ng/mL (ref 1.0–8.0)
Myoglobin, poc: 91.2 ng/mL (ref 12–200)
Troponin i, poc: <0.05 ng/mL (ref 0.00–0.09)

## 2010-07-29 LAB — BRAIN NATRIURETIC PEPTIDE: Pro B Natriuretic peptide (BNP): 78 pg/mL (ref 0.0–100.0)

## 2010-07-29 LAB — MAGNESIUM
Magnesium: 1.3 mg/dL — ABNORMAL LOW (ref 1.5–2.5)
Magnesium: 2 mg/dL (ref 1.5–2.5)
Magnesium: 2.1 mg/dL (ref 1.5–2.5)
Magnesium: 2.4 mg/dL (ref 1.5–2.5)

## 2010-07-29 LAB — TYPE AND SCREEN
ABO/RH(D): AB POS
Antibody Screen: NEGATIVE

## 2010-07-29 LAB — SAVE SMEAR

## 2010-07-29 LAB — HEMOGLOBIN AND HEMATOCRIT, BLOOD
HCT: 24 % — ABNORMAL LOW (ref 39.0–52.0)
Hemoglobin: 8.3 g/dL — ABNORMAL LOW (ref 13.0–17.0)

## 2010-07-29 LAB — ABO/RH: ABO/RH(D): AB POS

## 2010-07-29 LAB — FOLATE: Folate: 17.5 ng/mL

## 2010-07-29 LAB — PLATELET COUNT: Platelets: 43 10*3/uL — CL (ref 150–400)

## 2010-07-29 LAB — POCT I-STAT GLUCOSE
Glucose, Bld: 89 mg/dL (ref 70–99)
Operator id: 153981

## 2010-07-29 LAB — CALCIUM, IONIZED: Calcium, Ion: 0.97 mmol/L — ABNORMAL LOW (ref 1.12–1.32)

## 2010-07-29 LAB — VITAMIN B12: Vitamin B-12: 385 pg/mL (ref 211–911)

## 2010-07-29 LAB — TROPONIN I: Troponin I: <0.01 ng/mL (ref 0.00–0.06)

## 2010-08-27 NOTE — Assessment & Plan Note (Signed)
OFFICE VISIT   Zachary Steele, Zachary Steele  DOB:  1950-09-04                                        July 19, 2008  CHART #:  [id number]   HISTORY OF PRESENT ILLNESS:  The patient is status post coronary artery  bypass grafting x3 done by Dr. Donata Clay on May 09, 2008.  The  patient was seen in the office June 02, 2008, pursue followup visit  and was discharged from the practice and told to follow up as needed.  Dr. Mayford Knife contacted our office yesterday stating that the patient  needed followup for his endovein harvest sites.  The patient presents  today with complaints of pain in his sternum with lifting his  granddaughter who is 6 months old.  He also noticed pain when lifting a  dresser that he was trying to take out to the street.  He states he is  ambulating, he gets tired easily.  He also states that recently, he was  raking the leaves and complains of pain bilateral legs radiating up to  his groin, noted with palpitation.  Again, he did see Dr. Mayford Knife  yesterday.  The patient is also concerned about returning to work.  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  He is currently filing for  disability.  The patient denies any opening or drainage from any of his  incision sites.  He is tolerating diet.  He is sleeping well at night.   PHYSICAL EXAMINATION:  VITALS:  Blood pressure 147/84, pulses 84, O2  saturations 97% on room air.  RESPIRATORY:  Clear to auscultation bilaterally.  CARDIAC:  Regular rate and rhythm.  No murmurs, gallops, or rubs noted.  Sternum stable.  ABDOMEN:  Soft.  EXTREMITIES:  Warm.  No edema noted.  INCISIONS:  All incisions are healed well.   IMPRESSION AND PLAN:  The patient is doing extremely well status post  coronary artery bypass graft.  It was discussed with him that is only  about a little over 2 months out from surgery and he still needs to be  cautious on lifting of anything over 10 pounds.  He is told to slowly  increase over 10 pounds gradually.  The patient instructed to continue  ambulating 3 to 4 times per day and increasing his length of  ambulation  slowly.  He was told that we will take to completely recover from  surgery and to gain off his muscle strength back.  The patient wants to  return and discuss with Dr. Donata Clay possibility of delaying, returned  to work, which she is supposed to return around April 20.  Appointment  has been made with Dr. Donata Clay on August 04, 2008.  The patient was  told in the interim, if he has any surgical issues, he is to contact us.  I did give him prescription for Darvocet-N 100 forty tablets.   Kerin Perna, M.D.  Electronically Signed   KMD/MEDQ  D:  07/19/2008  T:  07/20/2008  Job:  [id number]   cc:   Dr. Adele Dan Donata Clay, M.D.

## 2010-08-27 NOTE — Assessment & Plan Note (Signed)
OFFICE VISIT   JAMAR, WEATHERALL  DOB:  [birth year]                                        August 08, 2008  CHART #:  [id number]   CURRENT PROBLEMS:  1. Status post coronary artery bypass graft x3 with endoscopic vein      harvest on May 09, 2008.  2. History of smoking and postoperative deconditioning.  3. History of hepatitis C.   PRESENT ILLNESS:  The patient returns for a second postop office visit  after going multivessel bypass grafting in late January for class IV  angina with severe three-vessel coronary artery disease.  At that time,  the left IMA was grafted to his LAD and vein grafts were placed to the  right coronary and vein graft to the distal circumflex marginal.  Cardiac status has been very stable.  Since surgery remaining in sinus  rhythm and without recurrent angina.  The surgical incisions are healing  well.  He still has significant difficulties with some dyspnea on  exertion.  Unfortunately he has not entered the cardiac rehab program.  He states he is not smoking.   PHYSICAL EXAMINATION:  VITAL SIGNS:  Blood pressure 120/80, pulse 60,  respirations 18, and saturation 97% on room air.  LUNGS:  Breath sounds are clear.  CARDIAC:  Rhythm is regular.  The surgical incision is well healed.  EXTREMITIES:  There is no peripheral edema.   CURRENT MEDICATIONS:  Aspirin, metoprolol 12.5 b.i.d., and Darvocet-N  100 p.r.n. pain.   IMPRESSION AND PLAN:  The patient is now 2-1/2 months postop multivessel  coronary artery bypass graft.  At that time of surgery, his ejection  fraction was noted to be 50-60%.  He did have significant disease of the  anterior descending (circumflex 80% and right coronary artery 99%).  He  has a very physical and demanding job, and I feel that another month of  recovery and rehab would be in his best interest before he returns to  work and I gave him a return to work for September 12, 2008.  I encouraged him  to take  it 20-minute walk on his own and we will have the rehab program  contact the patient for a formal phase II cardiac rehab.  He will return  here as needed.  If his exertional symptoms do not improve, then a  further assessment with a potential stress test or stress echo by Dr.  Mayford Knife maybe indicated.  He is not experiencing the chest pain that he  had preoperatively.   Kerin Perna, M.D.  Electronically Signed   PV/MEDQ  D:  08/08/2008  T:  08/09/2008  Job:  [id number]   cc:   Armanda Magic, M.D.  Elsworth Soho, M.D.
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
## 2010-08-27 NOTE — Cardiovascular Report (Signed)
NAME:  Zachary Steele, Zachary Steele NO.:  [id number]   MEDICAL RECORD NO.:  [id number]          PATIENT TYPE:  INP   LOCATION:  2036                         FACILITY:  [facility]   PHYSICIAN:  Corky Crafts, MDDATE OF BIRTH:  October 15, 1950   DATE OF PROCEDURE:  05/04/2008  DATE OF DISCHARGE:  05/13/2008                            CARDIAC CATHETERIZATION   PROCEDURES PERFORMED:  Left heart catheterization, left ventriculogram,  coronary angiogram, abdominal aortogram, attempted PCI of the right  coronary artery.   PRIMARY CARE PHYSICIAN:  L. Lupe Carney, MD   PRIMARY CARDIOLOGIST:  Armanda Magic, MD   OPERATOR:  Corky Crafts, MD   INDICATIONS:  Unstable angina.   PROCEDURE NARRATIVE:  The risks and benefits of cardiac catheterization  were explained to the patient and informed consent was obtained.  He was  brought to the Cath Lab.  He was prepped and draped in the usual sterile  fashion.  His right groin was infiltrated with 1% lidocaine.  A 6-French  sheath was placed into the right femoral artery using the modified  Seldinger technique.  Left coronary artery angiography was performed  using a JL-4.0 catheter.  The catheter was advanced to the vessel ostium  under fluoroscopic guidance.  Digital angiography was performed in  multiple projections using hand injection of contrast.  Right coronary  artery angiography was performed using a JR-4.0 catheter.  The catheter  was advanced to the vessel ostium under fluoroscopic guidance.  Digital  angiography was performed in multiple projections using hand injection  of contrast.  A pigtail catheter was advanced to the ascending aorta and  across the aortic valve under fluoroscopic guidance.  Power injection of  contrast was performed in the RAO projection to image the left  ventricle.  Catheter was pulled back under continuous hemodynamic  pressure monitoring.  The catheter was then withdrawn to the abdominal  aorta.   Power injection was performed in the AP projection.  A JR-4  guiding catheter was then used to engage the right coronary artery.  A  Prowater wire was attempted to cross the lesion but was unsuccessful.  Subsequently, this was changed to a Whisper wire.  This was also  unsuccessful.  The guide was changed out to a hockey-stick guide.  The  Whisper was tried again with no success in terms of crossing the lesion.  A [employer] 3 gram wire was also tried to cross the lesion but  without success.  After another wire was tried, we stopped our attempts  at revascularizing the right coronary artery.  The patient did not have  any significant chest pain.  At the end of the procedure, he felt fine.  This is likely because of the left-to-right collaterals that were  feeding the distal right coronary artery territory.  The sheath was  removed using manual compression.   FINDINGS:  The left main was widely patent.  The left circumflex was  occluded proximally.  The LAD provided collaterals to the distal  circumflex.   The left anterior descending was heavily calcified large vessel which  was diffusely diseased.  There was a 50% lesion in the proximal portion  of the mid vessel.  There was an 80% lesion in the more distal part of  the mid vessel.   Ramus vessel was a large vessel with mild luminal irregularities.   The right coronary artery was a medium-to-large sized vessel with mild-  to-moderate atherosclerosis proximally.  There was a 99%  subtotal  occlusion of the mid vessel with left-to-right collaterals supplying the  PDA and distal right coronary artery.   The left ventriculogram showed normal left ventricular function with  ejection fraction of 60%.   HEMODYNAMICS:  Left ventricular pressure 106/1 with an LVEDP of 4 mmHg.  Aortic pressure 103/68 with a mean aortic pressure of 84 mmHg.   Abdominal aortogram; no abdominal aortic aneurysm.  The iliacs appeared  calcified.  There was  mild right renal artery stenosis of about 25%.  The left renal artery is widely patent.  PCI as outlined above with  multiple guides including JR-4 hockey-stick in the shepherd's crook were  tried and multiple wires without success.  There was a dissection noted  at the occluded area but the patient did not experience EKG changes or  any symptoms.   IMPRESSION:  1. Severe three-vessel coronary artery disease unsuccessful attempt at      percutaneous coronary intervention of the right coronary artery.  2. Normal left ventricular function.  3. No abdominal aortic aneurysm.   RECOMMENDATIONS:  We attempted a PCI in this case because the patient  was high risk for surgery and he preferred to avoid surgery.  However,  his other vessels are not very amendable to PCI.  We will obtain CVTS  consult to see if despite his other issues they would perform bypass  surgery.      Corky Crafts, MD  Electronically Signed     JSV/MEDQ  D:  07/13/2008  T:  07/14/2008  Job:  [id number]

## 2010-08-27 NOTE — Assessment & Plan Note (Signed)
OFFICE VISIT   Zachary Steele, Zachary Steele  DOB:  28-May-1950                                        June 02, 2008  CHART #:  [id number]   CURRENT PROBLEMS:  1. Status post coronary artery bypass graft x3 for class IV unstable      angina and three-vessel disease on May 09, 2008.  2. Chronic liver disease with hepatitis C and alcoholic cirrhosis.  3. Good left ventricular function.   PRESENT ILLNESS:  The patient is a 60 year old black male, who returns  for his first office visit after urgent multivessel coronary artery  bypass grafting for unstable angina.  He presented with minimally  elevated cardiac enzymes.  He was found to have a 99% stenosis of the  right coronary, 80% stenosis of the circumflex, and 75% stenosis of the  LAD.  He underwent left IMA grafting to his LAD and vein grafts to the  right coronary artery and distal circumflex.  He had a coagulopathy  secondary to his thrombocytopenia, which improved with transfusion  therapy.  He was discharged home in sinus rhythm on Lopressor 12.5  b.i.d., Lasix for 1 week, aspirin 81 mg, and pain medication.  He is not  on a statin due to his liver disease.  He has had no recurrent angina,  surgical incisions are healing and he has no symptoms of CHF or fluid  retention.   PHYSICAL EXAMINATION:  VITAL SIGNS:  Blood pressure 102/60, pulse 60 and  regular, respirations 18, and saturation 98%.  GENERAL:  He is alert and pleasant.  LUNGS:  Breath sounds are clear and equal.  CHEST:  The sternum is healing.  CARDIAC:  Rhythm is regular without rub or gallop.  EXTREMITIES:  The leg incisions are healing well.  There is no  peripheral edema.   PA and lateral chest x-ray reveals clear lung fields and no pleural  effusion.  The sternal wires are intact.   PLAN:  The patient will start increasing activity level.  He does not  drive.  He lift up to 10-15 pounds.  I have asked the outpatient rehab  program to  contact the patient for phase II cardiac rehab.  Because of  his bradycardia and mild borderline hypotension, he will reduce the  Lopressor to once a day 12.5 mg.  I gave him a refill for his  hydrocodone 7.5 mg (40 tablets).  He will return here as needed.   Kerin Perna, M.D.  Electronically Signed   PV/MEDQ  D:  06/02/2008  T:  06/02/2008  Job:  [id number]   cc:   [facility]

## 2010-08-27 NOTE — Discharge Summary (Signed)
NAME:  Zachary Steele, Zachary Steele NO.:  [id number]   MEDICAL RECORD NO.:  [id number]          PATIENT TYPE:  INP   LOCATION:  2036                         FACILITY:  [facility]   PHYSICIAN:  Kerin Perna, M.D.  DATE OF BIRTH:  12-05-1950   DATE OF ADMISSION:  04/28/2008  DATE OF DISCHARGE:  05/13/2008                               DISCHARGE SUMMARY   ADMITTING DIAGNOSES:  1. Multivessel coronary artery disease (an ejection fraction of 60%).  2. History of thrombocytopenia.  3. Tobacco abuse.  4. Cirrhosis secondary to alcohol abuse.  5. Recently diagnosed hepatitis C.   DISCHARGE DIAGNOSES:  1. Multivessel coronary artery disease (an ejection fraction of 60%).  2. History of thrombocytopenia.  3. Tobacco abuse.  4. Cirrhosis secondary to alcohol abuse.  5. Recently diagnosed hepatitis C.   PROCEDURES:  1. Cardiac catheterization, performed on May 04, 2008, by Dr.      Eldridge Dace.  The patient was found to have an ejection fraction of      60%, 80% stenosis of the left anterior descending, 80% stenosis of      the circumflex marginal, and 99% stenosis of the right coronary      artery.  2. Echocardiogram, done on May 05, 2008, which again showed a      preserved ejection fraction of 60%, no significant valvular      disease.  3. Coronary artery bypass graft x3 (left internal mammary artery to      left anterior descending, saphenous vein graft to distal circumflex      and marginal, and saphenous vein graft to right coronary artery      with endoscopic vein harvesting of the left leg, done by Dr. Donata Clay on May 09, 2008.   HISTORY OF PRESENT ILLNESS:  This is a 60 year old African American male  with a history of alcohol and tobacco abuse (since the age of 14), who  was admitted with chest pain and shortness of breath upon exertion.  Initial EKG showed normal sinus rhythm and nonspecific T-wave  abnormality.  Cardiac enzymes revealed the CK-MB to be  2.3, troponin  0.05.  In addition, CBC that was done upon admission showed a platelet  count to be 50,000 and white blood cell count to be 2900.  According to  medical records, he has had previous thrombocytopenia since 2001, as  well as leukocytopenia since 2002.  It was felt that the  thrombocytopenia was likely secondary to chronic alcoholic liver  disease.  The patient was also found to have elevated transaminases.  Ultrasounds of the gallbladder did reveal small gallstones but negative  for acute cholecystitis or biliary dilatation.  Hepatitis panel was also  obtained, and the patient was found to be hep C positive.  DIC panel was  found to be negative.   The patient underwent a cardiac catheterization with Dr. Eldridge Dace on  May 04, 2008, and was found to have multivessel coronary artery  disease with preserved EF.  In addition, PTCA was attempted on the RCA;  however, this was unsuccessful and resulted  in a slight dissection of  the vessel.  The patient remained hemodynamically stable and had no EKG  changes or chest pain.  The patient then underwent an echocardiogram on  May 05, 2008.  Again, the patient was found to have preserved EF, no  significant valvular disease.  A cardiothoracic consultation was  obtained with Dr. Donata Clay.  The patient had carotid duplex ultrasound  performed as well, which showed no significant stenosis and then  underwent the aforementioned CABG x3 on May 09, 2008.   BRIEF HOSPITAL COURSE STAY:  The patient was extubated late in the  evening of surgery.  He remained afebrile and hemodynamically stable.  The patient was initially AAI paced.  He continued to have  thrombocytopenia.  This was closely monitored.  He was also found to  have acute blood loss anemia.  Hematocrit remained relatively stable  (29).  He was volume overloaded and diuresed accordingly.  All chest  tubes removed by May 11, 2008.  Followup chest x-ray revealed   bilateral pleural effusions with increasing bibasilar atelectasis, as  well as what appeared to be a left loculated hydropneumothorax.  The  patient was transferred from the intensive care unit to [facility] for further  convalescence.  The patient continued to progress well with cardiac  rehab and continued to improve such that currently, he is afebrile.  Heart rate in the 70s, BP 109/69, O2 sat 92% on 2 liters.   PHYSICAL EXAMINATION:  CARDIOVASCULAR:  Regular rate and rhythm.  PULMONARY:  Clear to auscultation bilaterally.  ABDOMEN:  Benign.  EXTREMITIES:  Mild edema.  Wounds are clean, dry, and continuing to  heal.   Provided the patient is weaned off oxygen and remains afebrile and  hemodynamically stable, he will be discharged on May 13, 2008.   LATEST LABORATORY STUDIES:  BMET done on May 12, 2008, showed sodium  133, potassium 3.9, BUN and creatinine were 9 and 0.92 respectively.  CBC done also on May 12, 2008, revealed H&H to be 10.1 and 29.5,  white count 6500, platelet count of 57,000.  Last chest x-ray done on  May 12, 2008, showed bilateral pleural effusion with increasing  basilar atelectasis what appeared to be a left loculated anterior  hydropneumothorax.   DISCHARGE INSTRUCTIONS:  The patient is not to drive or lift more than  10 pounds.  He is to continue his breathing exercise daily.  He is to  walk every day and increase frequency and duration as tolerated.  He is  to remain on a low-fat, low-salt diet.  He is instructed he may shower.  He is to cleanse his wounds with mild soap and water.  He is to call the  office if any wound problems arise.   FOLLOWUP APPOINTMENTS:  1. The patient has appointment to see Dr. Mayford Knife on May 26, 2008,      at 12:45 p.m.  2. The patient also has a followup appointment to see Dr. Donata Clay on      June 02, 2008, at 12:15 p.m.  Prior to this office appointment,      a chest x-ray will be obtained.    MEDICATIONS:  1. Lopressor 12.5 mg 1 p.o. 2 times daily.  2. Lasix 40 mg p.o. daily.  3. KCl 20 mEq p.o. daily.  Both Lasix and potassium for 1 week.  1. Oxycodone 5 mg 1-2 tablets p.o. q.4-6 h. as needed for pain.  2. Enteric-coated aspirin 325 p.o. daily.  Doree Fudge, [place]      Kerin Perna, M.D.  Electronically Signed    DZ/MEDQ  D:  05/12/2008  T:  05/13/2008  Job:  [id number]

## 2010-08-27 NOTE — Consult Note (Signed)
NAME:  Zachary Steele, Zachary Steele NO.:  [id number]   MEDICAL RECORD NO.:  [id number]          PATIENT TYPE:  INP   LOCATION:  3729                         FACILITY:  [facility]   PHYSICIAN:  Graylin Shiver, M.D.   DATE OF BIRTH:  1950-09-19   DATE OF CONSULTATION:  04/29/2008  DATE OF DISCHARGE:                                 CONSULTATION   REASON FOR CONSULTATION:  The patient is a 60 year old black male, who  was admitted to the hospital with complaints of chest pain which he has  had for quite some time and also exertional dyspnea.  He recently saw  Dr. Lupe Carney in the office, who subsequently referred him to Dr.  Armanda Magic.  Dr. Mayford Knife admitted him to the hospital for observation  and to proceed with a cardiac cath in a couple of days.   Today Dr. Viann Fish was rounding on the patient and asked me to see  the patient in consultation because of elevated liver enzymes and  decreased platelet count and to determine if the patient might have  significant liver disease.   The patient has no known history of liver disease.  He does, however,  have a history of heavy alcohol abuse over the years.  He used to drink  a lot of wine, but now states he only drinks about 1 pint at night.  He  has used IV drugs in the past, but denies ever sharing needles.   His hemoglobin and hematocrit are 13 and 37.8, MCV elevated at 105.7,  and platelet count is 60,000.  Prothrombin time slightly elevated at  15.7, AST 97, and ALT 83, both are slightly elevated.   MEDICATIONS PRIOR TO ADMISSION:  Ibuprofen and cyclobenzaprine.   SOCIAL HISTORY:  He smokes and he drinks alcohol.   ALLERGIES:  None known.   REVIEW OF SYSTEMS:  Negative except for above.   PHYSICAL EXAMINATION:  GENERAL:  He is in no distress.  EYES:  Nonicteric.  HEART:  Regular rhythm.  No murmurs.  LUNGS:  Clear.  ABDOMEN:  Soft, nontender, no hepatosplenomegaly.   IMPRESSION:  1. Probable alcoholic liver  disease.  2. Rule out hepatitis C.   PLAN:  We will obtain a hepatitis profile.  We will obtain an abdominal  ultrasound.  He is also going to get a B12 and a folic acid level.   It will be determined by Dr. Mayford Knife whether to proceed with his cardiac  cath given the low platelet count at this time.           ______________________________  Graylin Shiver, M.D.     SFG/MEDQ  D:  04/29/2008  T:  04/29/2008  Job:  [id number]   cc:   Armanda Magic, M.D.  Elsworth Soho, M.D.
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
## 2010-08-27 NOTE — Op Note (Signed)
NAME:  Zachary Steele, Zachary Steele NO.:  [id number]   MEDICAL RECORD NO.:  [id number]           PATIENT TYPE:   LOCATION:                                 FACILITY:   PHYSICIAN:  Kerin Perna, M.D.  DATE OF BIRTH:  1950-07-31   DATE OF PROCEDURE:  DATE OF DISCHARGE:                               OPERATIVE REPORT   OPERATION:  1. Coronary artery bypass grafting x3 (left internal mammary artery to      LAD, saphenous vein graft to right coronary artery, saphenous vein      graft to distal circumflex marginal).  2. Endoscopic harvest of the left leg greater saphenous vein.   SURGEON:  Kerin Perna, MD   ASSISTANT:  Doree Fudge, PA   PREOPERATIVE DIAGNOSIS:  Class IV unstable angina with severe 3-vessel  coronary artery disease.   POSTOPERATIVE DIAGNOSIS:  Class IV unstable angina with severe 3-vessel  coronary artery disease.   ANESTHESIA:  General.   INDICATIONS:  The patient is a 60 year old black male who presented with  symptoms of unstable angina.  Cardiac enzymes were minimally elevated.  He was placed on heparin and nitroglycerin.  On admission, he was found  to be thrombocytopenic with an elevated INR and LFT's and so his heparin  was stopped.  A 2-D echo showed good global LV function and he was  prepared for a cardiac catheterization which was performed by Dr.  Eldridge Dace.  This demonstrated a 99% stenosis of the right coronary, 75%  stenosis of the LAD, and 80% stenosis of the distal circumflex.  An  attempted percutaneous intervention to the right coronary was  unsuccessful and he was felt to be a candidate for surgical  revascularization.   Prior to surgery, I examined the patient and his hospital room and  reviewed results of the cardiac catheterization with him and his family.  I discussed the indications and expected benefits of coronary bypass  surgery for treatment of his coronary artery disease.  I reviewed the  alternatives to surgical  therapy.  I discussed the major issues  including the location of the surgical incisions, the use of general  anesthesia and cardiopulmonary bypass, and expected postoperative  recovery.  I discussed the risks of coronary artery bypass surgery to  him including risks of MI, stroke, bleeding, infection, and death.  He  understood that with his history of hepatic disease from heavy alcohol  abuse and hepatitis C that he would be at increased risk for bleeding  and blood transfusion requirements.  After reviewing these issues, he  demonstrated his understanding and agreed to proceed with surgery under  what I felt was an informed consent.   OPERATIVE FINDINGS:  1. Severe calcified proximal coronary artery disease, successfully      treated with multivessel bypass grafting.  2. Intraoperative coagulopathy requiring platelets and FFP after      reversal of heparin with protamine.  3. Good LV function without evidence of scarring or myocardial      fibrosis.  4. 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   PROCEDURE:  The patient was brought to the operative room and placed  supine on the operating table, where general anesthesia was induced.  The chest, abdomen, and legs were prepped with Betadine and draped as a  sterile field.  A sternal incision was made as the saphenous vein was  harvested endoscopically from the left leg.  The left internal mammary  artery was harvested as a pedicle graft from its origin at the  subclavian vessels.  The left pleural space was obliterated with  adhesions and these were taken down in order to mobilize the mammary  artery.  The mammary artery was good vessel with good flow, although it  measured only 1.2 mm in diameter.   The sternal retractor was placed and the pericardium was opened and  suspended.  There were no pericardial adhesions.  The aorta was  inspected, palpated, examined and found to be free of  significant  plaque.  Pursestrings were placed in the ascending aorta and right  atrium.  After the vein had been harvested and inspected and found to be  adequate, the patient was heparinized and cannulated.  The patient was  placed on cardiopulmonary bypass and the coronaries were identified for  grafting.  Cardioplegia catheters were placed for both antegrade and  retrograde cold blood cardioplegia.  The patient was cooled to 32  degrees and aortic cross-clamp was applied.  An 800 mL of cold blood  cardioplegia was delivered in split doses between the antegrade aortic  and retrograde coronary sinus catheters.  There is good cardioplegic  arrest and septal temperature dropped less than 14 degrees.  Cardioplegia was delivered every 20 minutes or less while the cross-  clamp was applied.   The distal coronary anastomoses were then performed.  The first distal  anastomosis was to the distal RCA.  There is a proximal 99% stenosis.  It was heavily calcified.  A reverse saphenous vein was sewn end-to-side  with running 7-0 Prolene with good flow through the graft.  The second  distal anastomosis was to the distal circumflex.  This was a small at  1.0-mm vessel with proximal 99% stenosis.  A reverse saphenous vein was  sewn end-to-side with running 7-0 Prolene with good flow through the  graft.  Cardioplegia was redosed.   The third distal anastomosis was to the distal third of the LAD.  It was  diffusely calcified and diseased.  The anastomosis was placed in a  minimally involved segment which was 1.5-mm in diameter.  The mammary  artery pedicle was brought through an opening created in the left  lateral pericardium and pericardium was brought down onto the LAD and  sewn end-to-side with running 8-0 Prolene.  There was good flow through  the anastomosis after briefly releasing the pedicle bulldog and the  mammary artery.  The mammary bulldog was reapplied and a pedicle was  secured in the  epicardium.  Cardioplegia was redosed.   While the cross-clamp still in place, 2 proximal vein anastomoses were  performed on the ascending aorta using a 4.0-mm punch running 7-0  Prolene.  Air was vented from the coronaries with a dose of retrograde  warm blood cardioplegia prior to tying down the final proximal  anastomosis.  The cross-clamp was then removed.   The heart resumed a spontaneous rhythm.  Air was aspirated from the vein  grafts with 27-gauge needle.  The cardioplegia catheters were removed.  The proximal and distal anastomoses were  checked and found to be  hemostatic.  The patient was rewarmed to 37 degrees and temporary pacing  wires were applied.  The lungs were expanded.  The patient was then  weaned from bypass without difficulty with stable hemodynamics and good  cardiac output.  Protamine was administered without adverse reaction.  However, there was still persistent coagulopathy and the patient was  given platelets and FFP with improved coagulation function.  The leg was  irrigated and closed in a standard fashion.  The superior pericardial  fat was closed over the aorta.  Two mediastinal and a left pleural chest  tube were placed and brought out through separate incisions.  The  sternum was closed with interrupted steel wire.  The pectoralis fascia  was closed with a running #1 Vicryl.  Subcutaneous and skin layers were  closed in a running Vicryl and sterile dressings were applied.  Total  bypass time was 100 minutes.      Kerin Perna, M.D.  Electronically Signed     PV/MEDQ  D:  05/09/2008  T:  05/10/2008  Job:  [id number]   cc:   Armanda Magic, M.D.
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
## 2010-09-17 ENCOUNTER — Ambulatory Visit (AMBULATORY_SURGERY_CENTER): Payer: PRIVATE HEALTH INSURANCE | Admitting: *Deleted

## 2010-09-17 VITALS — Ht 67.0 in | Wt 154.0 lb

## 2010-09-17 DIAGNOSIS — Z1211 Encounter for screening for malignant neoplasm of colon: Secondary | ICD-10-CM

## 2010-09-17 MED ORDER — PEG-KCL-NACL-NASULF-NA ASC-C 100 G PO SOLR: ORAL | Status: DC

## 2010-09-24 ENCOUNTER — Encounter: Payer: Self-pay | Admitting: Gastroenterology

## 2010-09-30 ENCOUNTER — Encounter: Payer: Self-pay | Admitting: Gastroenterology

## 2010-09-30 ENCOUNTER — Ambulatory Visit (AMBULATORY_SURGERY_CENTER): Payer: PRIVATE HEALTH INSURANCE | Admitting: Gastroenterology

## 2010-09-30 VITALS — BP 110/64 | HR 57 | Temp 97.3°F | Resp 16 | Ht 67.0 in | Wt 150.0 lb

## 2010-09-30 DIAGNOSIS — D126 Benign neoplasm of colon, unspecified: Secondary | ICD-10-CM

## 2010-09-30 DIAGNOSIS — Z1211 Encounter for screening for malignant neoplasm of colon: Secondary | ICD-10-CM

## 2010-09-30 DIAGNOSIS — K5730 Diverticulosis of large intestine without perforation or abscess without bleeding: Secondary | ICD-10-CM

## 2010-09-30 MED ORDER — SODIUM CHLORIDE 0.9 % IV SOLN: 500.0000 mL | INTRAVENOUS | Status: DC

## 2010-09-30 NOTE — Patient Instructions (Addendum)
One of your biggest health concerns is your smoking.  This increases your risk for most cancers and serious cardiovascular diseases such as strokes, heart attacks.  You should try your best to stop.  If you need assistence, please contact your PCP or Smoking Cessation Class at [facility] [phone]) or [facility] Quit-Line (1-800-QUIT-NOW).  Green and blue discharge instructions reviewed with patient and care partner.  Impressions:  Polyp and diverticulosis. Handouts given.  Please continue medications as you were taking them prior to your procedure.

## 2010-10-01 ENCOUNTER — Telehealth: Payer: Self-pay | Admitting: Gastroenterology

## 2010-10-01 ENCOUNTER — Ambulatory Visit (INDEPENDENT_AMBULATORY_CARE_PROVIDER_SITE_OTHER)
Admission: RE | Admit: 2010-10-01 | Discharge: 2010-10-01 | Disposition: A | Discharge status: Home / Self Care | Payer: PRIVATE HEALTH INSURANCE | Source: Ambulatory Visit | Attending: Gastroenterology | Admitting: Gastroenterology

## 2010-10-01 ENCOUNTER — Telehealth: Payer: Self-pay | Admitting: *Deleted

## 2010-10-01 ENCOUNTER — Other Ambulatory Visit (INDEPENDENT_AMBULATORY_CARE_PROVIDER_SITE_OTHER): Payer: PRIVATE HEALTH INSURANCE

## 2010-10-01 DIAGNOSIS — R143 Flatulence: Secondary | ICD-10-CM

## 2010-10-01 DIAGNOSIS — R140 Abdominal distension (gaseous): Secondary | ICD-10-CM

## 2010-10-01 DIAGNOSIS — R109 Unspecified abdominal pain: Secondary | ICD-10-CM

## 2010-10-01 DIAGNOSIS — R110 Nausea: Secondary | ICD-10-CM

## 2010-10-01 DIAGNOSIS — R141 Gas pain: Secondary | ICD-10-CM

## 2010-10-01 LAB — CBC WITH DIFFERENTIAL/PLATELET
Basophils Absolute: 0 10*3/uL (ref 0.0–0.1)
Basophils Relative: 0.8 % (ref 0.0–3.0)
Eosinophils Absolute: 0.1 10*3/uL (ref 0.0–0.7)
Eosinophils Relative: 2.7 % (ref 0.0–5.0)
HCT: 41.7 % (ref 39.0–52.0)
Hemoglobin: 14.6 g/dL (ref 13.0–17.0)
Lymphocytes Relative: 29 % (ref 12.0–46.0)
Lymphs Abs: 0.9 10*3/uL (ref 0.7–4.0)
MCHC: 34.9 g/dL (ref 30.0–36.0)
MCV: 108.1 fl — ABNORMAL HIGH (ref 78.0–100.0)
Monocytes Absolute: 0.3 10*3/uL (ref 0.1–1.0)
Monocytes Relative: 10.7 % (ref 3.0–12.0)
Neutro Abs: 1.8 10*3/uL (ref 1.4–7.7)
Neutrophils Relative %: 56.8 % (ref 43.0–77.0)
Platelets: 39 10*3/uL — CL (ref 150.0–400.0)
RBC: 3.85 Mil/uL — ABNORMAL LOW (ref 4.22–5.81)
RDW: 13.8 % (ref 11.5–14.6)
WBC: 3.2 10*3/uL — ABNORMAL LOW (ref 4.5–10.5)

## 2010-10-01 NOTE — Telephone Encounter (Signed)
Needs cbc, flat and upright abd plain films today (check for free air).  thanks

## 2010-10-01 NOTE — Telephone Encounter (Signed)
Routed note to Chales Abrahams  To order  Needed CBC and xray and follow up call to patient.

## 2010-10-01 NOTE — Telephone Encounter (Signed)
On call note at 0620. Pt called complaining of mild lower abd pain, gas, bloating and one episode of N/V this morning. Had colonoscopy yesterday with polypectomy. Has not passed much flatus. Advised warm clear liquids and walking this morning. If symptoms do not resolve over the next few hours call the office for further advice.

## 2010-10-01 NOTE — Telephone Encounter (Signed)
Follow up Call- Patient questions:  Do you have a fever, pain , or abdominal swelling? yes Pain Score  4 *  Have you tolerated food without any problems? yes  Have you been able to return to your normal activities? no  Do you have any questions about your discharge instructions: Diet   no Medications  no Follow up visit  no  Do you have questions or concerns about your Care? yes  Actions:Notified MD per Routing of note. * If pain score is 4 or above: Pain Complaint Variance Form Initiated.  Follow up call to Patient this am. Patient stating this  Morning around 0200 he awoke with nausea,vomiting and abdominal distention.  Stating vomitus was salty in taste. Stated he felt that he was bloated with abd. Pain of 10/10 at that time. Patient stating he was unsure if he had a fever but he  was hot and cold. This morning he had a bowel movement which was "mushy brown with blood." Patient stating the blood was less than 1/2 cup, but was bright red. Patient states he called the 24 hour telephone number and was told to drink hot coffee or tea, which he is presently doing. Patient states his pain is a 4/10, still with some distention, no further vomiting. Note forwarded to Dr. Christella Hartigan for instruction. Informed the patient if he worsens prior to my return call to please go to the ED.

## 2010-10-01 NOTE — Telephone Encounter (Signed)
Pt is aware and the orders are in EPIC he will be in today

## 2010-10-03 ENCOUNTER — Telehealth: Payer: Self-pay | Admitting: Gastroenterology

## 2010-10-03 ENCOUNTER — Emergency Department (HOSPITAL_COMMUNITY): Payer: PRIVATE HEALTH INSURANCE

## 2010-10-03 ENCOUNTER — Emergency Department (HOSPITAL_COMMUNITY)
Admission: EM | Admit: 2010-10-03 | Discharge: 2010-10-03 | Disposition: A | Discharge status: Home / Self Care | Payer: PRIVATE HEALTH INSURANCE | Attending: Emergency Medicine | Admitting: Emergency Medicine

## 2010-10-03 ENCOUNTER — Telehealth: Payer: Self-pay

## 2010-10-03 ENCOUNTER — Telehealth: Payer: Self-pay | Admitting: Internal Medicine

## 2010-10-03 DIAGNOSIS — R112 Nausea with vomiting, unspecified: Secondary | ICD-10-CM | POA: Insufficient documentation

## 2010-10-03 DIAGNOSIS — I2510 Atherosclerotic heart disease of native coronary artery without angina pectoris: Secondary | ICD-10-CM | POA: Insufficient documentation

## 2010-10-03 DIAGNOSIS — R195 Other fecal abnormalities: Secondary | ICD-10-CM | POA: Insufficient documentation

## 2010-10-03 DIAGNOSIS — Z7982 Long term (current) use of aspirin: Secondary | ICD-10-CM | POA: Insufficient documentation

## 2010-10-03 DIAGNOSIS — R197 Diarrhea, unspecified: Secondary | ICD-10-CM | POA: Insufficient documentation

## 2010-10-03 DIAGNOSIS — Z8619 Personal history of other infectious and parasitic diseases: Secondary | ICD-10-CM | POA: Insufficient documentation

## 2010-10-03 LAB — COMPREHENSIVE METABOLIC PANEL
ALT: 66 U/L — ABNORMAL HIGH (ref 0–53)
AST: 118 U/L — ABNORMAL HIGH (ref 0–37)
Albumin: 3.5 g/dL (ref 3.5–5.2)
Alkaline Phosphatase: 112 U/L (ref 39–117)
BUN: 9 mg/dL (ref 6–23)
CO2: 21 mEq/L (ref 19–32)
Calcium: 8.7 mg/dL (ref 8.4–10.5)
Chloride: 99 mEq/L (ref 96–112)
Creatinine, Ser: 0.76 mg/dL (ref 0.50–1.35)
GFR calc Af Amer: >60 mL/min (ref >60)
GFR calc non Af Amer: >60 mL/min (ref >60)
Glucose, Bld: 110 mg/dL — ABNORMAL HIGH (ref 70–99)
Potassium: 3.6 mEq/L (ref 3.5–5.1)
Sodium: 134 mEq/L — ABNORMAL LOW (ref 135–145)
Total Bilirubin: 2.6 mg/dL — ABNORMAL HIGH (ref 0.3–1.2)
Total Protein: 8 g/dL (ref 6.0–8.3)

## 2010-10-03 LAB — PROTIME-INR
INR: 1.35 (ref 0.00–1.49)
Prothrombin Time: 16.9 s — ABNORMAL HIGH (ref 11.6–15.2)

## 2010-10-03 LAB — DIFFERENTIAL
Basophils Absolute: 0 10*3/uL (ref 0.0–0.1)
Basophils Relative: 0 % (ref 0–1)
Eosinophils Absolute: 0 10*3/uL (ref 0.0–0.7)
Eosinophils Relative: 1 % (ref 0–5)
Lymphocytes Relative: 21 % (ref 12–46)
Lymphs Abs: 0.7 10*3/uL (ref 0.7–4.0)
Monocytes Absolute: 0.2 10*3/uL (ref 0.1–1.0)
Monocytes Relative: 7 % (ref 3–12)
Neutro Abs: 2.4 10*3/uL (ref 1.7–7.7)
Neutrophils Relative %: 71 % (ref 43–77)

## 2010-10-03 LAB — APTT: aPTT: 30 seconds (ref 24–37)

## 2010-10-03 LAB — CBC
HCT: 42.2 % (ref 39.0–52.0)
Hemoglobin: 15.4 g/dL (ref 13.0–17.0)
MCH: 36.9 pg — ABNORMAL HIGH (ref 26.0–34.0)
MCHC: 36.5 g/dL — ABNORMAL HIGH (ref 30.0–36.0)
MCV: 101.2 fL — ABNORMAL HIGH (ref 78.0–100.0)
Platelets: 39 10*3/uL — ABNORMAL LOW (ref 150–400)
RBC: 4.17 MIL/uL — ABNORMAL LOW (ref 4.22–5.81)
RDW: 12.3 % (ref 11.5–15.5)
WBC: 3.3 10*3/uL — ABNORMAL LOW (ref 4.0–10.5)

## 2010-10-03 LAB — OCCULT BLOOD, POC DEVICE: Fecal Occult Bld: NEGATIVE

## 2010-10-03 NOTE — Telephone Encounter (Signed)
The patient called at 6:54 AM today. His chief complaint was vomiting. States that he had colonoscopy Monday. He was concerned about dehydration and the hot weather. He was taking himself to the emergency room for evaluation. I told him that I would pass this along to Dr. Christella Hartigan.

## 2010-10-03 NOTE — Telephone Encounter (Signed)
Pt was advised to call his PCP per Dr Christella Hartigan instructions from his phone note on 10/02/10.  I advised pt that if his PCP thought he needed to see his GI they would contact us directly.  He will call back if he has any further concerns or questions.

## 2010-10-03 NOTE — Telephone Encounter (Signed)
FYI:  Dr Gwenette Greet office called for records regarding the pt, he was seen today by her and she was requesting records because she is suspecting an esophageal bleed because of his vomiting today. I did advise her the the pt called our office and was brought in for labs and xray and was told to f/u with his PCP regarding his labs.

## 2010-10-10 DIAGNOSIS — Z0271 Encounter for disability determination: Secondary | ICD-10-CM

## 2011-01-29 LAB — DIFFERENTIAL
Basophils Absolute: 0
Basophils Relative: 0
Eosinophils Absolute: 0
Eosinophils Relative: 0
Lymphocytes Relative: 39
Lymphs Abs: 1.2
Monocytes Absolute: 0.2
Monocytes Relative: 6
Neutro Abs: 1.7
Neutrophils Relative %: 55

## 2011-01-29 LAB — PROTIME-INR
INR: 1.3
Prothrombin Time: 17 — ABNORMAL HIGH

## 2011-01-29 LAB — POCT CARDIAC MARKERS
CKMB, poc: 1.7
CKMB, poc: 1.8
Myoglobin, poc: 100
Myoglobin, poc: 92.2
Operator id: 146091
Operator id: 257131
Troponin i, poc: 0.08 — ABNORMAL HIGH
Troponin i, poc: <0.05

## 2011-01-29 LAB — I-STAT 8, (EC8 V) (CONVERTED LAB)
Acid-base deficit: 3 — ABNORMAL HIGH
BUN: 6
Bicarbonate: 21
Chloride: 111
Glucose, Bld: 108 — ABNORMAL HIGH
HCT: 42
Hemoglobin: 14.3
Operator id: 146091
Potassium: 3.5
Sodium: 143
TCO2: 22
pCO2, Ven: 32.3 — ABNORMAL LOW
pH, Ven: 7.42 — ABNORMAL HIGH

## 2011-01-29 LAB — HEPATIC FUNCTION PANEL
ALT: 102 — ABNORMAL HIGH
AST: 163 — ABNORMAL HIGH
Albumin: 3.6
Alkaline Phosphatase: 103
Bilirubin, Direct: 0.2
Indirect Bilirubin: 0.7
Total Bilirubin: 0.9
Total Protein: 7.5

## 2011-01-29 LAB — CBC
HCT: 38.3 — ABNORMAL LOW
Hemoglobin: 13.3
MCHC: 34.8
MCV: 102.9 — ABNORMAL HIGH
Platelets: 67 — ABNORMAL LOW
RBC: 3.73 — ABNORMAL LOW
RDW: 12.2
WBC: 3.1 — ABNORMAL LOW

## 2011-01-29 LAB — RAPID URINE DRUG SCREEN, HOSP PERFORMED
Amphetamines: NOT DETECTED
Barbiturates: NOT DETECTED
Benzodiazepines: NOT DETECTED
Cocaine: POSITIVE — AB
Opiates: NOT DETECTED
Tetrahydrocannabinol: POSITIVE — AB

## 2011-01-29 LAB — LIPASE, BLOOD: Lipase: 40

## 2011-01-29 LAB — ETHANOL: Alcohol, Ethyl (B): 293 — ABNORMAL HIGH

## 2011-01-29 LAB — POCT I-STAT CREATININE
Creatinine, Ser: 0.9
Operator id: 146091

## 2011-01-29 LAB — AMYLASE: Amylase: 125

## 2011-01-29 LAB — APTT: aPTT: 30

## 2011-05-07 DIAGNOSIS — Z0279 Encounter for issue of other medical certificate: Secondary | ICD-10-CM

## 2011-09-17 ENCOUNTER — Emergency Department (HOSPITAL_COMMUNITY)
Admission: EM | Admit: 2011-09-17 | Discharge: 2011-09-18 | Disposition: A | Discharge status: Home / Self Care | Payer: 59 | Attending: Emergency Medicine | Admitting: Emergency Medicine

## 2011-09-17 ENCOUNTER — Emergency Department (HOSPITAL_COMMUNITY): Payer: 59

## 2011-09-17 DIAGNOSIS — R0602 Shortness of breath: Secondary | ICD-10-CM | POA: Insufficient documentation

## 2011-09-17 DIAGNOSIS — F329 Major depressive disorder, single episode, unspecified: Secondary | ICD-10-CM | POA: Insufficient documentation

## 2011-09-17 DIAGNOSIS — Z951 Presence of aortocoronary bypass graft: Secondary | ICD-10-CM | POA: Insufficient documentation

## 2011-09-17 DIAGNOSIS — F3289 Other specified depressive episodes: Secondary | ICD-10-CM | POA: Insufficient documentation

## 2011-09-17 DIAGNOSIS — K920 Hematemesis: Secondary | ICD-10-CM | POA: Insufficient documentation

## 2011-09-17 DIAGNOSIS — R10819 Abdominal tenderness, unspecified site: Secondary | ICD-10-CM | POA: Insufficient documentation

## 2011-09-17 DIAGNOSIS — Z8619 Personal history of other infectious and parasitic diseases: Secondary | ICD-10-CM | POA: Insufficient documentation

## 2011-09-17 DIAGNOSIS — Z7982 Long term (current) use of aspirin: Secondary | ICD-10-CM | POA: Insufficient documentation

## 2011-09-17 DIAGNOSIS — F1010 Alcohol abuse, uncomplicated: Secondary | ICD-10-CM

## 2011-09-17 HISTORY — DX: Peripheral vascular disease, unspecified: I73.9

## 2011-09-17 LAB — CBC
HCT: 39.1 % (ref 39.0–52.0)
Hemoglobin: 14.3 g/dL (ref 13.0–17.0)
MCH: 36.4 pg — ABNORMAL HIGH (ref 26.0–34.0)
MCHC: 36.6 g/dL — ABNORMAL HIGH (ref 30.0–36.0)
MCV: 99.5 fL (ref 78.0–100.0)
Platelets: 51 10*3/uL — ABNORMAL LOW (ref 150–400)
RBC: 3.93 MIL/uL — ABNORMAL LOW (ref 4.22–5.81)
RDW: 13.7 % (ref 11.5–15.5)
WBC: 3.4 10*3/uL — ABNORMAL LOW (ref 4.0–10.5)

## 2011-09-17 LAB — URINE MICROSCOPIC-ADD ON

## 2011-09-17 LAB — RAPID URINE DRUG SCREEN, HOSP PERFORMED
Amphetamines: NOT DETECTED
Barbiturates: NOT DETECTED
Benzodiazepines: NOT DETECTED
Cocaine: NOT DETECTED
Opiates: NOT DETECTED
Tetrahydrocannabinol: POSITIVE — AB

## 2011-09-17 LAB — PROTIME-INR
INR: 1.24 (ref 0.00–1.49)
Prothrombin Time: 15.9 seconds — ABNORMAL HIGH (ref 11.6–15.2)

## 2011-09-17 LAB — COMPREHENSIVE METABOLIC PANEL
ALT: 91 U/L — ABNORMAL HIGH (ref 0–53)
AST: 192 U/L — ABNORMAL HIGH (ref 0–37)
Albumin: 3.2 g/dL — ABNORMAL LOW (ref 3.5–5.2)
Alkaline Phosphatase: 137 U/L — ABNORMAL HIGH (ref 39–117)
BUN: 3 mg/dL — ABNORMAL LOW (ref 6–23)
CO2: 25 mEq/L (ref 19–32)
Calcium: 9 mg/dL (ref 8.4–10.5)
Chloride: 100 mEq/L (ref 96–112)
Creatinine, Ser: 0.56 mg/dL (ref 0.50–1.35)
GFR calc Af Amer: >90 mL/min (ref >90)
GFR calc non Af Amer: >90 mL/min (ref >90)
Glucose, Bld: 103 mg/dL — ABNORMAL HIGH (ref 70–99)
Potassium: 3.9 mEq/L (ref 3.5–5.1)
Sodium: 137 mEq/L (ref 135–145)
Total Bilirubin: 1.7 mg/dL — ABNORMAL HIGH (ref 0.3–1.2)
Total Protein: 8.3 g/dL (ref 6.0–8.3)

## 2011-09-17 LAB — ETHANOL: Alcohol, Ethyl (B): 17 mg/dL — ABNORMAL HIGH (ref 0–11)

## 2011-09-17 LAB — URINALYSIS, ROUTINE W REFLEX MICROSCOPIC
Glucose, UA: NEGATIVE mg/dL
Hgb urine dipstick: NEGATIVE
Nitrite: POSITIVE — AB
Protein, ur: NEGATIVE mg/dL
Specific Gravity, Urine: 1.029 (ref 1.005–1.030)
Urobilinogen, UA: 4 mg/dL — ABNORMAL HIGH (ref 0.0–1.0)
pH: 5.5 (ref 5.0–8.0)

## 2011-09-17 LAB — APTT: aPTT: 34 seconds (ref 24–37)

## 2011-09-17 LAB — LIPASE, BLOOD: Lipase: 67 U/L — ABNORMAL HIGH (ref 11–59)

## 2011-09-17 MED ORDER — LORAZEPAM 2 MG/ML IJ SOLN: 1.0000 mg | Freq: Four times a day (QID) | INTRAMUSCULAR | Status: DC | PRN

## 2011-09-17 MED ORDER — ADULT MULTIVITAMIN W/MINERALS CH
1.0000 | ORAL_TABLET | Freq: Every day | ORAL | Status: DC
  Administered 2011-09-17 – 2011-09-18 (×2): 1 via ORAL

## 2011-09-17 MED ORDER — FOLIC ACID 1 MG PO TABS
1.0000 mg | ORAL_TABLET | Freq: Every day | ORAL | Status: DC
  Administered 2011-09-17 – 2011-09-18 (×2): 1 mg via ORAL
  Filled 2011-09-17 (×2): qty 1

## 2011-09-17 MED ORDER — LORAZEPAM 1 MG PO TABS
1.0000 mg | ORAL_TABLET | Freq: Four times a day (QID) | ORAL | Status: DC | PRN
  Administered 2011-09-17: 1 mg via ORAL
  Filled 2011-09-17: qty 1

## 2011-09-17 MED ORDER — THIAMINE HCL 100 MG/ML IJ SOLN: 100.0000 mg | Freq: Every day | INTRAMUSCULAR | Status: DC

## 2011-09-17 MED ORDER — VITAMIN B-1 100 MG PO TABS
100.0000 mg | ORAL_TABLET | Freq: Every day | ORAL | Status: DC
  Administered 2011-09-17 – 2011-09-18 (×2): 100 mg via ORAL
  Filled 2011-09-17 (×2): qty 1

## 2011-09-17 MED ORDER — ONDANSETRON 4 MG PO TBDP
4.0000 mg | ORAL_TABLET | Freq: Three times a day (TID) | ORAL | Status: DC | PRN
  Administered 2011-09-17: 4 mg via ORAL
  Filled 2011-09-17 (×2): qty 1

## 2011-09-17 MED ORDER — PANTOPRAZOLE SODIUM 40 MG IV SOLR
40.0000 mg | Freq: Once | INTRAVENOUS | Status: AC
  Administered 2011-09-17: 40 mg via INTRAVENOUS
  Filled 2011-09-17: qty 40

## 2011-09-17 MED ORDER — ONDANSETRON HCL 4 MG/2ML IJ SOLN
4.0000 mg | INTRAMUSCULAR | Status: DC | PRN
  Filled 2011-09-17 (×2): qty 2

## 2011-09-17 MED ORDER — NITROFURANTOIN MONOHYD MACRO 100 MG PO CAPS
100.0000 mg | ORAL_CAPSULE | Freq: Two times a day (BID) | ORAL | Status: DC
  Administered 2011-09-17 – 2011-09-18 (×2): 100 mg via ORAL
  Filled 2011-09-17 (×3): qty 1

## 2011-09-17 NOTE — ED Notes (Signed)
Pt reports coughing up blood in mucus this am then vomiting with streaks of blood in emesis. Pt also reports he drinks a fifth of wine a day and would like rehab for alcohol. Pt last drink was 7 am this morning.

## 2011-09-17 NOTE — ED Notes (Signed)
Daughter [phone] call when back in room.

## 2011-09-17 NOTE — ED Notes (Signed)
Pt reports he has been vomiting excessively to the point he has noticed blood, pt reports he drinks excessive amounts of ETOH daily, and he has had enough, now ready to go through detox tx. Pt pleasant, cooperative, denies prior detox tx, but states he has attended some AA meetings. Pt reports he gets really sick when he is not drinking. Pt states he is unable to eat/drink and gets dehydrated. Pt denies SI/HI/AH/VH, denies prior inpt/outpt mental health tx.

## 2011-09-17 NOTE — BH Assessment (Signed)
Assessment Note   Zachary Steele is an 61 y.o. male. Pt reported to the [facility] with a chief complaint of detox from alcohol. Pt denies a previous mental health hx or previous detox/rehab. Pt states that he has been drinking since he was 13 and was in prison for 1 1/2 years for a DWI 20+ years ago. Pt states that his mother died 2 years which triggered his increase in alcohol use. Pt states that he drinks 1/5 or more of wine daily and he has began drinking first thing in the morning. Pt added "I feel like I'm addicted to it, like it calls to me each day." Pt states that he has depressive symptoms such as fatigue, loss of interest in usual pleasures and irritability. Pt states he has the current withdrawal symptoms: nausea, chills, shaking, tingling, etc. Pt appears motivated for treatment stating " I have a bad liver, I know I need help, I can't stop by myself."  Pt's information is being sent to [facility] for disposition.   Axis I: Alcohol Abuse; Cannabis Abuse; Mood Disorder NOS Axis II: Deferred Axis III:  Past Medical History  Diagnosis Date  . Anemia   . Cataract   . Hepatitis C     Has A and B also   Axis IV: occupational problems and other psychosocial or environmental problems Axis V: 41-50 serious symptoms  Past Medical History:  Past Medical History  Diagnosis Date  . Anemia   . Cataract   . Hepatitis C     Has A and B also    Past Surgical History  Procedure Date  . Coronary artery bypass graft 2010    Triple bypass at [facility]    Family History:  Family History  Problem Relation Age of Onset  . Stroke Mother     Social History:  reports that he has been smoking.  He has never used smokeless tobacco. He reports that he drinks about 2 ounces of alcohol per week. He reports that he does not use illicit drugs.  Additional Social History:  Alcohol / Drug Use History of alcohol / drug use?: Yes Longest period of sobriety (when/how long): 1 1/2 yrs sober 20+ years  ago Withdrawal Symptoms: Nausea / Vomiting;Patient aware of relationship between substance abuse and physical/medical complications;Tingling;Fever / Chills Substance #1 Name of Substance 1: Alcohol  1 - Age of First Use: 13 1 - Amount (size/oz): 1/5 wine or more 1 - Frequency: daily 1 - Duration: 2 years 1 - Last Use / Amount: 09/17/11 1 glass of wine at 7am Substance #2 Name of Substance 2: THC 2 - Age of First Use: unknown 2 - Amount (size/oz): varies 2 - Frequency: 1-2x weekly 2 - Duration: "years" 2 - Last Use / Amount: 3 days ago- 1/2 joint  CIWA: CIWA-Ar BP: 124/79 mmHg Pulse Rate: 67  Nausea and Vomiting: mild nausea with no vomiting Tactile Disturbances: none Tremor: three Auditory Disturbances: not present Paroxysmal Sweats: no sweat visible Visual Disturbances: not present Anxiety: mildly anxious Headache, Fullness in Head: very mild Agitation: somewhat more than normal activity Orientation and Clouding of Sensorium: oriented and can do serial additions CIWA-Ar Total: 7  COWS:    Allergies: No Known Allergies  Home Medications:  (Not in a hospital admission)  OB/GYN Status:  No LMP for male patient.  General Assessment Data Location of Assessment: [facility] ED Living Arrangements: Spouse/significant other;Children (3grandchildren in the home) Can pt return to current living arrangement?: Yes Admission Status: Voluntary  Is patient capable of signing voluntary admission?: Yes Transfer from: Acute Hospital Referral Source: Self/Family/Friend  Education Status Is patient currently in school?: No  Risk to self Suicidal Ideation: No Suicidal Intent: No Is patient at risk for suicide?: No Suicidal Plan?: No Access to Means: No What has been your use of drugs/alcohol within the last 12 months?: ETOH, 1/5 wine daily for 2 yrs; THC occasionally Previous Attempts/Gestures: No How many times?: 0  Other Self Harm Risks: no Triggers for Past Attempts: None  known Intentional Self Injurious Behavior: None Family Suicide History: No Recent stressful life event(s): Other (Comment) (loss of mother 2 years ago, stress at work) Persecutory voices/beliefs?: No Depression: Yes Depression Symptoms: Fatigue;Loss of interest in usual pleasures Substance abuse history and/or treatment for substance abuse?: Yes ((alcohol/THC)) Suicide prevention information given to non-admitted patients: Not applicable  Risk to Others Homicidal Ideation: No Thoughts of Harm to Others: No Current Homicidal Intent: No Current Homicidal Plan: No Access to Homicidal Means: No Identified Victim: none History of harm to others?: No Assessment of Violence: None Noted Violent Behavior Description: none- pt is calm and cooperative Does patient have access to weapons?: No Criminal Charges Pending?: No Does patient have a court date: No  Psychosis Hallucinations: None noted Delusions: None noted  Mental Status Report Appear/Hygiene: Other (Comment) (appropriate to circumstances) Eye Contact: Good Motor Activity: Freedom of movement Speech: Logical/coherent Level of Consciousness: Quiet/awake Mood: Depressed Affect: Appropriate to circumstance;Depressed Anxiety Level: None Thought Processes: Coherent;Relevant Judgement: Unimpaired Orientation: Person;Place;Time;Situation Obsessive Compulsive Thoughts/Behaviors: None  Cognitive Functioning Concentration: Normal Memory: Recent Intact;Remote Intact IQ: Average Insight: Good Impulse Control: Fair Appetite: Poor Weight Loss: 20  (1 one year) Weight Gain: 0  Sleep: No Change Total Hours of Sleep: 4  ("can only sleep if drinking") Vegetative Symptoms: None  ADLScreening [facility] Assessment Services) Patient's cognitive ability adequate to safely complete daily activities?: Yes Patient able to express need for assistance with ADLs?: Yes Independently performs ADLs?: Yes  Abuse/Neglect [facility]) Physical Abuse:  Denies Verbal Abuse: Denies Sexual Abuse: Denies  Prior Inpatient Therapy Prior Inpatient Therapy: No Prior Therapy Dates: none Prior Therapy Facilty/Provider(s): none Reason for Treatment: n/a  Prior Outpatient Therapy Prior Outpatient Therapy: No Prior Therapy Dates: none Prior Therapy Facilty/Provider(s): none Reason for Treatment: n/a  ADL Screening (condition at time of admission) Patient's cognitive ability adequate to safely complete daily activities?: Yes Patient able to express need for assistance with ADLs?: Yes Independently performs ADLs?: Yes       Abuse/Neglect Assessment (Assessment to be complete while patient is alone) Physical Abuse: Denies Verbal Abuse: Denies Sexual Abuse: Denies Values / Beliefs Cultural Requests During Hospitalization: None Spiritual Requests During Hospitalization: None        Additional Information 1:1 In Past 12 Months?: No CIRT Risk: No Elopement Risk: No Does patient have medical clearance?: Yes     Disposition:  Disposition Disposition of Patient: Inpatient treatment program;Referred to [facility]) Type of inpatient treatment program: Adult Patient referred to: Other (Comment) [facility])  On Site Evaluation by:   Reviewed with Physician:     N[place] Crane F 09/17/2011 11:02 PM

## 2011-09-17 NOTE — ED Provider Notes (Signed)
History     CSN: [id number]  Arrival date & time 09/17/11  1235   First MD Initiated Contact with Patient 09/17/11 1413      Chief Complaint  Patient presents with  . Hemoptysis  . Medical Clearance    (Consider location/radiation/quality/duration/timing/severity/associated sxs/prior treatment) HPI Comments: Patient is a 61 year old male with a history of coronary artery bypass graft, hepatitis C, anemia, and substance abuse of alcohol presents emergency department with a chief complaint of hematemesis and requesting rehabilitation for alcohol.  Patient states that he had 3 episodes of hematemesis this morning associated with some mild nausea, but denies any abdominal pain, diarrhea, lightheadedness, syncope, chest pain, cough, hemoptysis, shortness of breath, claudication, urinary symptoms, melena, hematochezia.  Patient has no other complaints at this time.  Pt denies being on current blood thinners, a hx of HIV or CA.   The history is provided by the patient.    Past Medical History  Diagnosis Date  . Anemia   . Cataract   . Hepatitis C     Has A and B also    Past Surgical History  Procedure Date  . Coronary artery bypass graft 2010    Triple bypass at [facility]    Family History  Problem Relation Age of Onset  . Stroke Mother     History  Substance Use Topics  . Smoking status: Current Some Day Smoker  . Smokeless tobacco: Never Used  . Alcohol Use: 2.0 oz/week    4 drink(s) per week      Review of Systems  Allergies  Review of patient's allergies indicates no known allergies.  Home Medications   Current Outpatient Rx  Name Route Sig Dispense Refill  . ASPIRIN 81 MG PO TABS Oral Take 81 mg by mouth daily.      Marland Kitchen MILK THISTLE 175 MG PO TABS Oral Take 175 mg by mouth 2 (two) times daily.      . ADULT MULTIVITAMIN W/MINERALS CH Oral Take 1 tablet by mouth daily.      BP 125/86  Pulse 69  Temp 98.4 F (36.9 C)  Resp 16  SpO2 98%  Physical Exam    Nursing note and vitals reviewed. Constitutional: He appears well-developed and well-nourished. No distress.  HENT:  Head: Normocephalic and atraumatic.  Eyes: Conjunctivae and EOM are normal. Pupils are equal, round, and reactive to light.  Neck: Normal range of motion. Neck supple. Normal carotid pulses and no JVD present. Carotid bruit is not present. No rigidity. Normal range of motion present.  Cardiovascular: Normal rate, regular rhythm, S1 normal, S2 normal, normal heart sounds, intact distal pulses and normal pulses.  Exam reveals no gallop and no friction rub.   No murmur heard.      No pitting edema bilaterally, RRR, no aberrant sounds on auscultations, distal pulses intact, no carotid bruit or JVD.   Pulmonary/Chest: Effort normal and breath sounds normal. No accessory muscle usage or stridor. No respiratory distress. He exhibits no tenderness and no bony tenderness.  Abdominal: Bowel sounds are normal.       Diffuse abd ttp, no localization. Non pulsatile aorta.   Skin: Skin is warm, dry and intact. No rash noted. He is not diaphoretic. No cyanosis. Nails show no clubbing.    ED Course  Procedures (including critical care time)  Labs Reviewed  CBC - Abnormal; Notable for the following:    WBC 3.4 (*)    RBC 3.93 (*)    MCH 36.4 (*)  MCHC 36.6 (*)    Platelets 51 (*)    All other components within normal limits  COMPREHENSIVE METABOLIC PANEL - Abnormal; Notable for the following:    Glucose, Bld 103 (*)    BUN 3 (*)    Albumin 3.2 (*)    AST 192 (*)    ALT 91 (*)    Alkaline Phosphatase 137 (*)    Total Bilirubin 1.7 (*)    All other components within normal limits  ETHANOL - Abnormal; Notable for the following:    Alcohol, Ethyl (B) 17 (*)    All other components within normal limits  URINE RAPID DRUG SCREEN (HOSP PERFORMED) - Abnormal; Notable for the following:    Tetrahydrocannabinol POSITIVE (*)    All other components within normal limits  URINALYSIS,  ROUTINE W REFLEX MICROSCOPIC - Abnormal; Notable for the following:    Color, Urine ORANGE (*) BIOCHEMICALS MAY BE AFFECTED BY COLOR   APPearance CLOUDY (*)    Bilirubin Urine MODERATE (*)    Ketones, ur TRACE (*)    Urobilinogen, UA 4.0 (*)    Nitrite POSITIVE (*)    Leukocytes, UA SMALL (*)    All other components within normal limits  PROTIME-INR - Abnormal; Notable for the following:    Prothrombin Time 15.9 (*)    All other components within normal limits  URINE MICROSCOPIC-ADD ON - Abnormal; Notable for the following:    Bacteria, UA MANY (*)    Casts HYALINE CASTS (*)    All other components within normal limits  LIPASE, BLOOD - Abnormal; Notable for the following:    Lipase 67 (*)    All other components within normal limits  APTT   Dg Chest 2 View  09/17/2011  *RADIOLOGY REPORT*  Clinical Data: Smoker, intermittent cough for 1 year, shortness of breath, prior open heart surgery  CHEST - 2 VIEW  Comparison: 11/14/2009  Findings: Normal heart size post CABG. Mediastinal contours and pulmonary vascularity normal. Atherosclerotic calcification aorta. Calcified granuloma lingula. Emphysematous and chronic bronchitic changes. No acute infiltrate, pleural effusion, pneumothorax. Old bilateral rib fractures. Old left scapular fracture. Chronic nodular opacity identified in mid left chest, 13 mm diameter, unchanged since 2005, likely associated with the posterior left eighth rib on an old CT exam.  IMPRESSION: Emphysematous, bronchitic, and old granulomatous disease changes. No acute abnormalities. Multiple old bilateral rib and left scapular fractures.  Original Report Authenticated By: Lollie Marrow, M.D.     No diagnosis found.    MDM  UTI, Medical Clearance  Pt to be moved to [facility] for alcohol detox and possible placement. Pt with VSS in NAD. ACT to consult. Pt ordered medications for treatment of UTI, Fluids given in ED        [facility], PA-C 09/17/11 1655

## 2011-09-17 NOTE — ED Notes (Signed)
Unable to obtain blood from patient at this time. RN aware.   

## 2011-09-17 NOTE — ED Notes (Signed)
Bed:[facility]A<BR> Expected date:09/17/11<BR> Expected time:<BR> Means of arrival:<BR> Comments:<BR> EMS 40 GC - coughing up blood

## 2011-09-17 NOTE — ED Notes (Signed)
EMS brings pt from PMD, who called EMS bc pt reported spitting up blood bright red blood with mucus at home. Pt also reports he is an alcoholic and wants rehab.

## 2011-09-18 ENCOUNTER — Encounter (HOSPITAL_COMMUNITY): Payer: Self-pay | Admitting: *Deleted

## 2011-09-18 ENCOUNTER — Inpatient Hospital Stay (HOSPITAL_COMMUNITY)
Admission: AD | Admit: 2011-09-18 | Discharge: 2011-09-24 | DRG: 897 | Disposition: A | Discharge status: Home / Self Care | Payer: 59 | Source: Ambulatory Visit | Attending: Psychiatry | Admitting: Psychiatry

## 2011-09-18 DIAGNOSIS — F1020 Alcohol dependence, uncomplicated: Secondary | ICD-10-CM | POA: Diagnosis present

## 2011-09-18 DIAGNOSIS — F1210 Cannabis abuse, uncomplicated: Secondary | ICD-10-CM | POA: Diagnosis present

## 2011-09-18 DIAGNOSIS — Z79899 Other long term (current) drug therapy: Secondary | ICD-10-CM

## 2011-09-18 DIAGNOSIS — K700 Alcoholic fatty liver: Secondary | ICD-10-CM | POA: Diagnosis present

## 2011-09-18 DIAGNOSIS — R42 Dizziness and giddiness: Secondary | ICD-10-CM | POA: Diagnosis present

## 2011-09-18 DIAGNOSIS — Z7982 Long term (current) use of aspirin: Secondary | ICD-10-CM

## 2011-09-18 DIAGNOSIS — N390 Urinary tract infection, site not specified: Secondary | ICD-10-CM | POA: Diagnosis present

## 2011-09-18 DIAGNOSIS — F10988 Alcohol use, unspecified with other alcohol-induced disorder: Principal | ICD-10-CM | POA: Diagnosis present

## 2011-09-18 DIAGNOSIS — D61818 Other pancytopenia: Secondary | ICD-10-CM | POA: Diagnosis present

## 2011-09-18 DIAGNOSIS — R748 Abnormal levels of other serum enzymes: Secondary | ICD-10-CM | POA: Diagnosis present

## 2011-09-18 DIAGNOSIS — B182 Chronic viral hepatitis C: Secondary | ICD-10-CM

## 2011-09-18 DIAGNOSIS — M311 Thrombotic microangiopathy: Secondary | ICD-10-CM

## 2011-09-18 DIAGNOSIS — F17200 Nicotine dependence, unspecified, uncomplicated: Secondary | ICD-10-CM | POA: Diagnosis present

## 2011-09-18 DIAGNOSIS — R042 Hemoptysis: Secondary | ICD-10-CM

## 2011-09-18 DIAGNOSIS — D691 Qualitative platelet defects: Secondary | ICD-10-CM | POA: Diagnosis present

## 2011-09-18 DIAGNOSIS — Z23 Encounter for immunization: Secondary | ICD-10-CM

## 2011-09-18 DIAGNOSIS — Z951 Presence of aortocoronary bypass graft: Secondary | ICD-10-CM

## 2011-09-18 DIAGNOSIS — I739 Peripheral vascular disease, unspecified: Secondary | ICD-10-CM | POA: Diagnosis present

## 2011-09-18 DIAGNOSIS — B1920 Unspecified viral hepatitis C without hepatic coma: Secondary | ICD-10-CM | POA: Diagnosis present

## 2011-09-18 DIAGNOSIS — F1994 Other psychoactive substance use, unspecified with psychoactive substance-induced mood disorder: Secondary | ICD-10-CM

## 2011-09-18 HISTORY — DX: Alcohol dependence, uncomplicated: F10.20

## 2011-09-18 LAB — COMPREHENSIVE METABOLIC PANEL
ALT: 78 U/L — ABNORMAL HIGH (ref 0–53)
AST: 154 U/L — ABNORMAL HIGH (ref 0–37)
Albumin: 2.8 g/dL — ABNORMAL LOW (ref 3.5–5.2)
Alkaline Phosphatase: 131 U/L — ABNORMAL HIGH (ref 39–117)
BUN: 5 mg/dL — ABNORMAL LOW (ref 6–23)
CO2: 29 mEq/L (ref 19–32)
Calcium: 9 mg/dL (ref 8.4–10.5)
Chloride: 98 mEq/L (ref 96–112)
Creatinine, Ser: 0.66 mg/dL (ref 0.50–1.35)
GFR calc Af Amer: >90 mL/min (ref >90)
GFR calc non Af Amer: >90 mL/min (ref >90)
Glucose, Bld: 136 mg/dL — ABNORMAL HIGH (ref 70–99)
Potassium: 4.4 mEq/L (ref 3.5–5.1)
Sodium: 135 mEq/L (ref 135–145)
Total Bilirubin: 1.7 mg/dL — ABNORMAL HIGH (ref 0.3–1.2)
Total Protein: 7.3 g/dL (ref 6.0–8.3)

## 2011-09-18 LAB — CBC
HCT: 34.9 % — ABNORMAL LOW (ref 39.0–52.0)
Hemoglobin: 12.6 g/dL — ABNORMAL LOW (ref 13.0–17.0)
MCH: 37 pg — ABNORMAL HIGH (ref 26.0–34.0)
MCHC: 36.1 g/dL — ABNORMAL HIGH (ref 30.0–36.0)
MCV: 102.3 fL — ABNORMAL HIGH (ref 78.0–100.0)
Platelets: 37 10*3/uL — ABNORMAL LOW (ref 150–400)
RBC: 3.41 MIL/uL — ABNORMAL LOW (ref 4.22–5.81)
RDW: 13.5 % (ref 11.5–15.5)
WBC: 3.3 10*3/uL — ABNORMAL LOW (ref 4.0–10.5)

## 2011-09-18 LAB — DIFFERENTIAL
Basophils Absolute: 0 10*3/uL (ref 0.0–0.1)
Basophils Relative: 1 % (ref 0–1)
Eosinophils Absolute: 0.1 10*3/uL (ref 0.0–0.7)
Eosinophils Relative: 3 % (ref 0–5)
Lymphocytes Relative: 33 % (ref 12–46)
Lymphs Abs: 1.1 10*3/uL (ref 0.7–4.0)
Monocytes Absolute: 0.3 10*3/uL (ref 0.1–1.0)
Monocytes Relative: 9 % (ref 3–12)
Neutro Abs: 1.8 10*3/uL (ref 1.7–7.7)
Neutrophils Relative %: 54 % (ref 43–77)

## 2011-09-18 MED ORDER — LORAZEPAM 1 MG PO TABS
1.0000 mg | ORAL_TABLET | ORAL | Status: DC | PRN
Start: 2011-09-18 — End: 2011-09-18
  Administered 2011-09-18: 1 mg via ORAL
  Filled 2011-09-18: qty 1

## 2011-09-18 MED ORDER — CHLORDIAZEPOXIDE HCL 25 MG PO CAPS
25.0000 mg | ORAL_CAPSULE | ORAL | Status: AC
  Administered 2011-09-21: 25 mg via ORAL
  Filled 2011-09-18 (×2): qty 1

## 2011-09-18 MED ORDER — ALUM & MAG HYDROXIDE-SIMETH 200-200-20 MG/5ML PO SUSP: 30.0000 mL | ORAL | Status: DC | PRN

## 2011-09-18 MED ORDER — ACETAMINOPHEN 325 MG PO TABS: 650.0000 mg | ORAL_TABLET | Freq: Four times a day (QID) | ORAL | Status: DC | PRN

## 2011-09-18 MED ORDER — ADULT MULTIVITAMIN W/MINERALS CH
1.0000 | ORAL_TABLET | Freq: Every day | ORAL | Status: DC
  Administered 2011-09-18 – 2011-09-23 (×6): 1 via ORAL
  Filled 2011-09-18 (×6): qty 1
  Filled 2011-09-18: qty 14
  Filled 2011-09-18: qty 1

## 2011-09-18 MED ORDER — CIPROFLOXACIN HCL 500 MG PO TABS
500.0000 mg | ORAL_TABLET | Freq: Two times a day (BID) | ORAL | Status: AC
  Administered 2011-09-18 – 2011-09-21 (×6): 500 mg via ORAL
  Filled 2011-09-18 (×6): qty 1

## 2011-09-18 MED ORDER — MAGNESIUM HYDROXIDE 400 MG/5ML PO SUSP: 30.0000 mL | Freq: Every day | ORAL | Status: DC | PRN

## 2011-09-18 MED ORDER — CHLORDIAZEPOXIDE HCL 25 MG PO CAPS: 25.0000 mg | ORAL_CAPSULE | Freq: Four times a day (QID) | ORAL | Status: DC | PRN

## 2011-09-18 MED ORDER — LOPERAMIDE HCL 2 MG PO CAPS: 2.0000 mg | ORAL_CAPSULE | ORAL | Status: AC | PRN

## 2011-09-18 MED ORDER — THIAMINE HCL 100 MG/ML IJ SOLN
100.0000 mg | Freq: Once | INTRAMUSCULAR | Status: AC
  Administered 2011-09-18: 100 mg via INTRAMUSCULAR

## 2011-09-18 MED ORDER — HYDROXYZINE HCL 25 MG PO TABS: 25.0000 mg | ORAL_TABLET | Freq: Four times a day (QID) | ORAL | Status: DC | PRN

## 2011-09-18 MED ORDER — ONDANSETRON 4 MG PO TBDP: 4.0000 mg | ORAL_TABLET | Freq: Four times a day (QID) | ORAL | Status: AC | PRN

## 2011-09-18 MED ORDER — VITAMIN B-1 100 MG PO TABS
100.0000 mg | ORAL_TABLET | Freq: Every day | ORAL | Status: DC
  Administered 2011-09-19 – 2011-09-23 (×5): 100 mg via ORAL
  Filled 2011-09-18 (×7): qty 1

## 2011-09-18 MED ORDER — CHLORDIAZEPOXIDE HCL 25 MG PO CAPS
25.0000 mg | ORAL_CAPSULE | Freq: Four times a day (QID) | ORAL | Status: AC
  Administered 2011-09-18 – 2011-09-19 (×6): 25 mg via ORAL
  Filled 2011-09-18 (×7): qty 1

## 2011-09-18 MED ORDER — PNEUMOCOCCAL VAC POLYVALENT 25 MCG/0.5ML IJ INJ
0.5000 mL | INJECTION | INTRAMUSCULAR | Status: AC
  Administered 2011-09-19: 0.5 mL via INTRAMUSCULAR
  Filled 2011-09-18: qty 0.5

## 2011-09-18 MED ORDER — IBUPROFEN 600 MG PO TABS: 600.0000 mg | ORAL_TABLET | Freq: Four times a day (QID) | ORAL | Status: DC | PRN

## 2011-09-18 MED ORDER — CHLORDIAZEPOXIDE HCL 25 MG PO CAPS
25.0000 mg | ORAL_CAPSULE | Freq: Every day | ORAL | Status: AC
  Administered 2011-09-22: 25 mg via ORAL

## 2011-09-18 MED ORDER — ACETAMINOPHEN 500 MG PO TABS
1000.0000 mg | ORAL_TABLET | Freq: Four times a day (QID) | ORAL | Status: DC | PRN
  Administered 2011-09-18: 1000 mg via ORAL
  Filled 2011-09-18: qty 2

## 2011-09-18 MED ORDER — CHLORDIAZEPOXIDE HCL 25 MG PO CAPS: 25.0000 mg | ORAL_CAPSULE | Freq: Three times a day (TID) | ORAL | Status: AC

## 2011-09-18 NOTE — [facility] Suicide Risk Assessment (Signed)
Suicide Risk Assessment  Admission Assessment     Demographic factors:  Assessment Details Time of Assessment: Admission Information Obtained From: Patient Current Mental Status:    Loss Factors:    Historical Factors:    Risk Reduction Factors:  Risk Reduction Factors: Sense of responsibility to family;Religious beliefs about death;Employed;Living with another person, especially a relative;Positive therapeutic relationship;Positive coping skills or problem solving skills  CLINICAL FACTORS:   Severe Anxiety and/or Agitation Depression:   Anhedonia Comorbid alcohol abuse/dependence Hopelessness Insomnia Alcohol/Substance Abuse/Dependencies Previous Psychiatric Diagnoses and Treatments  COGNITIVE FEATURES THAT CONTRIBUTE TO RISK:  Closed-mindedness Thought constriction (tunnel vision)    SUICIDE RISK:   Minimal: No identifiable suicidal ideation.  Patients presenting with no risk factors but with morbid ruminations; may be classified as minimal risk based on the severity of the depressive symptoms  Reason for hospitalization: .alcohol detox Diagnosis:  Axis I: Alcohol Abuse and Substance Induced Mood Disorder  ADL's:  Intact  Sleep: Poor  Appetite:  Poor  Suicidal Ideation:  Denies adamantly any suicidal thoughts. Homicidal Ideation:  Denies adamantly any homicidal thoughts.  Mental Status Examination/Evaluation: Objective:  Appearance: Disheveled  Eye Contact::  Fair  Speech:  Clear and Coherent  Volume:  Normal  Mood:  Anxious and Irritable  Affect:  Blunt  Thought Process:  Coherent  Orientation:  Full  Thought Content:  WDL  Suicidal Thoughts:  No  Homicidal Thoughts:  No  Memory:  Immediate;   Fair  Judgement:  Impaired  Insight:  Lacking  Psychomotor Activity:  Normal  Concentration:  Fair  Recall:  Fair  Akathisia:  No  AIMS (if indicated):     Assets:  Communication Skills Desire for Improvement  Sleep:      Vital Signs:Blood pressure 111/86,  pulse 101, temperature 98.1 F (36.7 C), height 5\' 7"  (1.702 m), weight 57.153 kg (126 lb). Current Medications: Current Facility-Administered Medications  Medication Dose Route Frequency Provider Last Rate Last Dose  . acetaminophen (TYLENOL) tablet 650 mg  650 mg Oral Q6H PRN Verne Spurr, PA-C      . alum & mag hydroxide-simeth (MAALOX/MYLANTA) 200-200-20 MG/5ML suspension 30 mL  30 mL Oral Q4H PRN Verne Spurr, PA-C      . chlordiazePOXIDE (LIBRIUM) capsule 25 mg  25 mg Oral Q6H PRN Verne Spurr, PA-C      . chlordiazePOXIDE (LIBRIUM) capsule 25 mg  25 mg Oral QID Verne Spurr, PA-C   25 mg at 09/18/11 1458   Followed by  . chlordiazePOXIDE (LIBRIUM) capsule 25 mg  25 mg Oral TID Verne Spurr, PA-C       Followed by  . chlordiazePOXIDE (LIBRIUM) capsule 25 mg  25 mg Oral BH-qamhs Verne Spurr, PA-C       Followed by  . chlordiazePOXIDE (LIBRIUM) capsule 25 mg  25 mg Oral Daily Verne Spurr, PA-C      . ciprofloxacin (CIPRO) tablet 500 mg  500 mg Oral BID Verne Spurr, PA-C      . hydrOXYzine (ATARAX/VISTARIL) tablet 25 mg  25 mg Oral Q6H PRN Verne Spurr, PA-C      . loperamide (IMODIUM) capsule 2-4 mg  2-4 mg Oral PRN Verne Spurr, PA-C      . magnesium hydroxide (MILK OF MAGNESIA) suspension 30 mL  30 mL Oral Daily PRN Verne Spurr, PA-C      . multivitamin with minerals tablet 1 tablet  1 tablet Oral Daily Verne Spurr, PA-C   1 tablet at 09/18/11 1459  . ondansetron (ZOFRAN-ODT)  disintegrating tablet 4 mg  4 mg Oral Q6H PRN Verne Spurr, PA-C      . pneumococcal 23 valent vaccine (PNU-IMMUNE) injection 0.5 mL  0.5 mL Intramuscular Tomorrow-1000 Mike Craze, MD      . thiamine (B-1) injection 100 mg  100 mg Intramuscular Once [employer], PA-C   100 mg at 09/18/11 1459  . thiamine (VITAMIN B-1) tablet 100 mg  100 mg Oral Daily Verne Spurr, PA-C       Facility-Administered Medications Ordered in Other Encounters  Medication Dose Route Frequency Provider Last Rate  Last Dose  . DISCONTD: acetaminophen (TYLENOL) tablet 1,000 mg  1,000 mg Oral Q6H PRN Ward Givens, MD   1,000 mg at 09/18/11 0828  . DISCONTD: folic acid (FOLVITE) tablet 1 mg  1 mg Oral Daily Lisette Paz, PA-C   1 mg at 09/18/11 0948  . DISCONTD: ibuprofen (ADVIL,MOTRIN) tablet 600 mg  600 mg Oral Q6H PRN Ward Givens, MD      . DISCONTD: LORazepam (ATIVAN) injection 1 mg  1 mg Intravenous Q6H PRN Lisette Paz, PA-C      . DISCONTD: LORazepam (ATIVAN) tablet 1 mg  1 mg Oral Q6H PRN Lisette Paz, PA-C   1 mg at 09/17/11 1706  . DISCONTD: LORazepam (ATIVAN) tablet 1 mg  1 mg Oral Q4H PRN Ward Givens, MD   1 mg at 09/18/11 0840  . DISCONTD: mulitivitamin with minerals tablet 1 tablet  1 tablet Oral Daily Lisette Paz, PA-C   1 tablet at 09/18/11 0948  . DISCONTD: nitrofurantoin (macrocrystal-monohydrate) (MACROBID) capsule 100 mg  100 mg Oral Q12H Lisette Paz, PA-C   100 mg at 09/18/11 0948  . DISCONTD: ondansetron (ZOFRAN) injection 4 mg  4 mg Intravenous PRN Lisette Paz, PA-C      . DISCONTD: ondansetron (ZOFRAN-ODT) disintegrating tablet 4 mg  4 mg Oral Q8H PRN Loren Racer, MD   4 mg at 09/17/11 2204  . DISCONTD: thiamine (B-1) injection 100 mg  100 mg Intravenous Daily Lisette Paz, PA-C      . DISCONTD: thiamine (VITAMIN B-1) tablet 100 mg  100 mg Oral Daily Lisette Paz, PA-C   100 mg at 09/18/11 9604    Lab Results:  Results for orders placed during the hospital encounter of 09/17/11 (from the past 48 hour(s))  CBC     Status: Abnormal   Collection Time   09/17/11  2:07 PM      Component Value Range Comment   WBC 3.4 (*) 4.0 - 10.5 (K/uL)    RBC 3.93 (*) 4.22 - 5.81 (MIL/uL)    Hemoglobin 14.3  13.0 - 17.0 (g/dL)    HCT 54.0  98.1 - 19.1 (%)    MCV 99.5  78.0 - 100.0 (fL)    MCH 36.4 (*) 26.0 - 34.0 (pg)    MCHC 36.6 (*) 30.0 - 36.0 (g/dL)    RDW 47.8  29.5 - 62.1 (%)    Platelets 51 (*) 150 - 400 (K/uL)   COMPREHENSIVE METABOLIC PANEL     Status: Abnormal   Collection Time   09/17/11   2:07 PM      Component Value Range Comment   Sodium 137  135 - 145 (mEq/L)    Potassium 3.9  3.5 - 5.1 (mEq/L)    Chloride 100  96 - 112 (mEq/L)    CO2 25  19 - 32 (mEq/L)    Glucose, Bld 103 (*) 70 - 99 (mg/dL)  BUN 3 (*) 6 - 23 (mg/dL)    Creatinine, Ser 4.69  0.50 - 1.35 (mg/dL)    Calcium 9.0  8.4 - 10.5 (mg/dL)    Total Protein 8.3  6.0 - 8.3 (g/dL)    Albumin 3.2 (*) 3.5 - 5.2 (g/dL)    AST 629 (*) 0 - 37 (U/L)    ALT 91 (*) 0 - 53 (U/L)    Alkaline Phosphatase 137 (*) 39 - 117 (U/L)    Total Bilirubin 1.7 (*) 0.3 - 1.2 (mg/dL)    GFR calc non Af Amer >90  >90 (mL/min)    GFR calc Af Amer >90  >90 (mL/min)   ETHANOL     Status: Abnormal   Collection Time   09/17/11  2:07 PM      Component Value Range Comment   Alcohol, Ethyl (B) 17 (*) 0 - 11 (mg/dL)   URINE RAPID DRUG SCREEN (HOSP PERFORMED)     Status: Abnormal   Collection Time   09/17/11  2:07 PM      Component Value Range Comment   Opiates NONE DETECTED  NONE DETECTED     Cocaine NONE DETECTED  NONE DETECTED     Benzodiazepines NONE DETECTED  NONE DETECTED     Amphetamines NONE DETECTED  NONE DETECTED     Tetrahydrocannabinol POSITIVE (*) NONE DETECTED     Barbiturates NONE DETECTED  NONE DETECTED    LIPASE, BLOOD     Status: Abnormal   Collection Time   09/17/11  2:07 PM      Component Value Range Comment   Lipase 67 (*) 11 - 59 (U/L)   URINALYSIS, ROUTINE W REFLEX MICROSCOPIC     Status: Abnormal   Collection Time   09/17/11  2:12 PM      Component Value Range Comment   Color, Urine ORANGE (*) YELLOW  BIOCHEMICALS MAY BE AFFECTED BY COLOR   APPearance CLOUDY (*) CLEAR     Specific Gravity, Urine 1.029  1.005 - 1.030     pH 5.5  5.0 - 8.0     Glucose, UA NEGATIVE  NEGATIVE (mg/dL)    Hgb urine dipstick NEGATIVE  NEGATIVE     Bilirubin Urine MODERATE (*) NEGATIVE     Ketones, ur TRACE (*) NEGATIVE (mg/dL)    Protein, ur NEGATIVE  NEGATIVE (mg/dL)    Urobilinogen, UA 4.0 (*) 0.0 - 1.0 (mg/dL)    Nitrite  POSITIVE (*) NEGATIVE     Leukocytes, UA SMALL (*) NEGATIVE    URINE MICROSCOPIC-ADD ON     Status: Abnormal   Collection Time   09/17/11  2:12 PM      Component Value Range Comment   WBC, UA 3-6  <3 (WBC/hpf)    RBC / HPF 0-2  <3 (RBC/hpf)    Bacteria, UA MANY (*) RARE     Casts HYALINE CASTS (*) NEGATIVE     Urine-Other MUCOUS PRESENT     PROTIME-INR     Status: Abnormal   Collection Time   09/17/11  2:42 PM      Component Value Range Comment   Prothrombin Time 15.9 (*) 11.6 - 15.2 (seconds)    INR 1.24  0.00 - 1.49    APTT     Status: Normal   Collection Time   09/17/11  2:42 PM      Component Value Range Comment   aPTT 34  24 - 37 (seconds)     Physical Findings: AIMS:  , ,  ,  ,  CIWA:  CIWA-Ar Total: 0  COWS:     Risk: Risk of harm to self is elevated by his depression and his addiction.  Risk of harm to others is elevated by his past history.  Treatment Plan Summary: Daily contact with patient to assess and evaluate symptoms and progress in treatment Medication management No signs/symptoms of withdrawal and mood/anxiety less than 3/10 where 1 is the best and 10 is the worst  Plan: Admit.  Start Librium protocol.  Discussed the risks, benefits, and probable clinical course with and without treatment.  Pt is agreeable to the current course of treatment. We will continue on q. 15 checks the unit protocol. At this time there is no clinical indication for one-to-one observation as patient contract for safety and presents little risk to harm themself and others.  We will increase collateral information. I encourage patient to participate in group milieu therapy. Pt will be seen in treatment team soon for further treatment and appropriate discharge planning. Please see history and physical note for more detailed information ELOS: 3 to 5 days.    Zachary Steele 09/18/2011, 4:05 PM

## 2011-09-18 NOTE — Progress Notes (Signed)
[facility] MD Progress Note  09/18/2011 3:49 PM  Diagnosis:  Axis I: Alcohol Abuse and Substance Induced Mood Disorder  ADL's:  Intact  Sleep: Poor  Appetite:  Poor  Suicidal Ideation:  Denies adamantly any suicidal thoughts. Homicidal Ideation:  Denies adamantly any homicidal thoughts.  Mental Status Examination/Evaluation: Objective:  Appearance: Disheveled  Eye Contact::  Fair  Speech:  Clear and Coherent  Volume:  Normal  Mood:  Anxious and Irritable  Affect:  Blunt  Thought Process:  Coherent  Orientation:  Full  Thought Content:  WDL  Suicidal Thoughts:  No  Homicidal Thoughts:  No  Memory:  Immediate;   Fair  Judgement:  Impaired  Insight:  Lacking  Psychomotor Activity:  Normal  Concentration:  Fair  Recall:  Fair  Akathisia:  No  AIMS (if indicated):     Assets:  Communication Skills Desire for Improvement  Sleep:      Vital Signs:Blood pressure 111/86, pulse 101, temperature 98.1 F (36.7 C), height 5\' 7"  (1.702 m), weight 57.153 kg (126 lb). Current Medications: Current Facility-Administered Medications  Medication Dose Route Frequency Provider Last Rate Last Dose  . acetaminophen (TYLENOL) tablet 650 mg  650 mg Oral Q6H PRN Verne Spurr, PA-C      . alum & mag hydroxide-simeth (MAALOX/MYLANTA) 200-200-20 MG/5ML suspension 30 mL  30 mL Oral Q4H PRN Verne Spurr, PA-C      . chlordiazePOXIDE (LIBRIUM) capsule 25 mg  25 mg Oral Q6H PRN Verne Spurr, PA-C      . chlordiazePOXIDE (LIBRIUM) capsule 25 mg  25 mg Oral QID Verne Spurr, PA-C   25 mg at 09/18/11 1458   Followed by  . chlordiazePOXIDE (LIBRIUM) capsule 25 mg  25 mg Oral TID Verne Spurr, PA-C       Followed by  . chlordiazePOXIDE (LIBRIUM) capsule 25 mg  25 mg Oral BH-qamhs Verne Spurr, PA-C       Followed by  . chlordiazePOXIDE (LIBRIUM) capsule 25 mg  25 mg Oral Daily Verne Spurr, PA-C      . ciprofloxacin (CIPRO) tablet 500 mg  500 mg Oral BID Verne Spurr, PA-C      . hydrOXYzine  (ATARAX/VISTARIL) tablet 25 mg  25 mg Oral Q6H PRN Verne Spurr, PA-C      . loperamide (IMODIUM) capsule 2-4 mg  2-4 mg Oral PRN Verne Spurr, PA-C      . magnesium hydroxide (MILK OF MAGNESIA) suspension 30 mL  30 mL Oral Daily PRN Verne Spurr, PA-C      . multivitamin with minerals tablet 1 tablet  1 tablet Oral Daily Verne Spurr, PA-C   1 tablet at 09/18/11 1459  . ondansetron (ZOFRAN-ODT) disintegrating tablet 4 mg  4 mg Oral Q6H PRN Verne Spurr, PA-C      . pneumococcal 23 valent vaccine (PNU-IMMUNE) injection 0.5 mL  0.5 mL Intramuscular Tomorrow-1000 Mike Craze, MD      . thiamine (B-1) injection 100 mg  100 mg Intramuscular Once [employer], PA-C   100 mg at 09/18/11 1459  . thiamine (VITAMIN B-1) tablet 100 mg  100 mg Oral Daily Verne Spurr, PA-C       Facility-Administered Medications Ordered in Other Encounters  Medication Dose Route Frequency Provider Last Rate Last Dose  . DISCONTD: acetaminophen (TYLENOL) tablet 1,000 mg  1,000 mg Oral Q6H PRN Ward Givens, MD   1,000 mg at 09/18/11 0828  . DISCONTD: folic acid (FOLVITE) tablet 1 mg  1 mg Oral Daily Lisette  Paz, PA-C   1 mg at 09/18/11 0948  . DISCONTD: ibuprofen (ADVIL,MOTRIN) tablet 600 mg  600 mg Oral Q6H PRN Ward Givens, MD      . DISCONTD: LORazepam (ATIVAN) injection 1 mg  1 mg Intravenous Q6H PRN Lisette Paz, PA-C      . DISCONTD: LORazepam (ATIVAN) tablet 1 mg  1 mg Oral Q6H PRN Lisette Paz, PA-C   1 mg at 09/17/11 1706  . DISCONTD: LORazepam (ATIVAN) tablet 1 mg  1 mg Oral Q4H PRN Ward Givens, MD   1 mg at 09/18/11 0840  . DISCONTD: mulitivitamin with minerals tablet 1 tablet  1 tablet Oral Daily Lisette Paz, PA-C   1 tablet at 09/18/11 0948  . DISCONTD: nitrofurantoin (macrocrystal-monohydrate) (MACROBID) capsule 100 mg  100 mg Oral Q12H Lisette Paz, PA-C   100 mg at 09/18/11 0948  . DISCONTD: ondansetron (ZOFRAN) injection 4 mg  4 mg Intravenous PRN Lisette Paz, PA-C      . DISCONTD: ondansetron  (ZOFRAN-ODT) disintegrating tablet 4 mg  4 mg Oral Q8H PRN Loren Racer, MD   4 mg at 09/17/11 2204  . DISCONTD: thiamine (B-1) injection 100 mg  100 mg Intravenous Daily Lisette Paz, PA-C      . DISCONTD: thiamine (VITAMIN B-1) tablet 100 mg  100 mg Oral Daily Lisette Paz, PA-C   100 mg at 09/18/11 4098    Lab Results:  Results for orders placed during the hospital encounter of 09/17/11 (from the past 48 hour(s))  CBC     Status: Abnormal   Collection Time   09/17/11  2:07 PM      Component Value Range Comment   WBC 3.4 (*) 4.0 - 10.5 (K/uL)    RBC 3.93 (*) 4.22 - 5.81 (MIL/uL)    Hemoglobin 14.3  13.0 - 17.0 (g/dL)    HCT 11.9  14.7 - 82.9 (%)    MCV 99.5  78.0 - 100.0 (fL)    MCH 36.4 (*) 26.0 - 34.0 (pg)    MCHC 36.6 (*) 30.0 - 36.0 (g/dL)    RDW 56.2  13.0 - 86.5 (%)    Platelets 51 (*) 150 - 400 (K/uL)   COMPREHENSIVE METABOLIC PANEL     Status: Abnormal   Collection Time   09/17/11  2:07 PM      Component Value Range Comment   Sodium 137  135 - 145 (mEq/L)    Potassium 3.9  3.5 - 5.1 (mEq/L)    Chloride 100  96 - 112 (mEq/L)    CO2 25  19 - 32 (mEq/L)    Glucose, Bld 103 (*) 70 - 99 (mg/dL)    BUN 3 (*) 6 - 23 (mg/dL)    Creatinine, Ser 7.84  0.50 - 1.35 (mg/dL)    Calcium 9.0  8.4 - 10.5 (mg/dL)    Total Protein 8.3  6.0 - 8.3 (g/dL)    Albumin 3.2 (*) 3.5 - 5.2 (g/dL)    AST 696 (*) 0 - 37 (U/L)    ALT 91 (*) 0 - 53 (U/L)    Alkaline Phosphatase 137 (*) 39 - 117 (U/L)    Total Bilirubin 1.7 (*) 0.3 - 1.2 (mg/dL)    GFR calc non Af Amer >90  >90 (mL/min)    GFR calc Af Amer >90  >90 (mL/min)   ETHANOL     Status: Abnormal   Collection Time   09/17/11  2:07 PM      Component Value Range  Comment   Alcohol, Ethyl (B) 17 (*) 0 - 11 (mg/dL)   URINE RAPID DRUG SCREEN (HOSP PERFORMED)     Status: Abnormal   Collection Time   09/17/11  2:07 PM      Component Value Range Comment   Opiates NONE DETECTED  NONE DETECTED     Cocaine NONE DETECTED  NONE DETECTED      Benzodiazepines NONE DETECTED  NONE DETECTED     Amphetamines NONE DETECTED  NONE DETECTED     Tetrahydrocannabinol POSITIVE (*) NONE DETECTED     Barbiturates NONE DETECTED  NONE DETECTED    LIPASE, BLOOD     Status: Abnormal   Collection Time   09/17/11  2:07 PM      Component Value Range Comment   Lipase 67 (*) 11 - 59 (U/L)   URINALYSIS, ROUTINE W REFLEX MICROSCOPIC     Status: Abnormal   Collection Time   09/17/11  2:12 PM      Component Value Range Comment   Color, Urine ORANGE (*) YELLOW  BIOCHEMICALS MAY BE AFFECTED BY COLOR   APPearance CLOUDY (*) CLEAR     Specific Gravity, Urine 1.029  1.005 - 1.030     pH 5.5  5.0 - 8.0     Glucose, UA NEGATIVE  NEGATIVE (mg/dL)    Hgb urine dipstick NEGATIVE  NEGATIVE     Bilirubin Urine MODERATE (*) NEGATIVE     Ketones, ur TRACE (*) NEGATIVE (mg/dL)    Protein, ur NEGATIVE  NEGATIVE (mg/dL)    Urobilinogen, UA 4.0 (*) 0.0 - 1.0 (mg/dL)    Nitrite POSITIVE (*) NEGATIVE     Leukocytes, UA SMALL (*) NEGATIVE    URINE MICROSCOPIC-ADD ON     Status: Abnormal   Collection Time   09/17/11  2:12 PM      Component Value Range Comment   WBC, UA 3-6  <3 (WBC/hpf)    RBC / HPF 0-2  <3 (RBC/hpf)    Bacteria, UA MANY (*) RARE     Casts HYALINE CASTS (*) NEGATIVE     Urine-Other MUCOUS PRESENT     PROTIME-INR     Status: Abnormal   Collection Time   09/17/11  2:42 PM      Component Value Range Comment   Prothrombin Time 15.9 (*) 11.6 - 15.2 (seconds)    INR 1.24  0.00 - 1.49    APTT     Status: Normal   Collection Time   09/17/11  2:42 PM      Component Value Range Comment   aPTT 34  24 - 37 (seconds)     Physical Findings: AIMS:  , ,  ,  ,    CIWA:  CIWA-Ar Total: 0  COWS:     Treatment Plan Summary: Daily contact with patient to assess and evaluate symptoms and progress in treatment Medication management No signs/symptoms of withdrawal and mood/anxiety less than 3/10 where 1 is the best and 10 is the worst  Plan: Admit.  Start  Librium protocol  Gabriela Irigoyen 09/18/2011, 3:49 PM

## 2011-09-18 NOTE — ED Notes (Signed)
First attempt to call report to [facility], staff in treatment room at moment, will call rn back.

## 2011-09-18 NOTE — Progress Notes (Signed)
Patient ID: Zachary Steele, male   DOB: February 24, 1951, 61 y.o.   MRN: [id number] Pt. Lying in bed, refuses group, upon initial contact, pt. Was a irritable, but mellowed after conversation continued. Pt. Reports here for detox. "trying to get it out of me, get food in." Writer notes fine tremor(see CIWA). Pt. Family visits earlier this evening. Pt. Denies pain and SHI. Writer reviewed meds and administration times. Pt. Nods in agreement. Staff will monitor q42min for safety.

## 2011-09-18 NOTE — ED Notes (Signed)
Report given to eric, rn at [facility]

## 2011-09-18 NOTE — Tx Team (Signed)
Initial Interdisciplinary Treatment Plan  PATIENT STRENGTHS: (choose at least two) Ability for insight Active sense of humor Average or above average intelligence Capable of independent living Communication skills General fund of knowledge Motivation for treatment/growth  PATIENT STRESSORS: Substance abuse   PROBLEM LIST: Problem List/Patient Goals Date to be addressed Date deferred Reason deferred Estimated date of resolution  ETOH abuse 09/18/2011                                                      DISCHARGE CRITERIA:  Ability to meet basic life and health needs Adequate post-discharge living arrangements Motivation to continue treatment in a less acute level of care Withdrawal symptoms are absent or subacute and managed without 24-hour nursing intervention  PRELIMINARY DISCHARGE PLAN: Attend aftercare/continuing care group Attend 12-step recovery group Outpatient therapy Return to previous living arrangement Return to previous work or school arrangements  PATIENT/FAMIILY INVOLVEMENT: This treatment plan has been presented to and reviewed with the patient, Zachary Steele, and/or family member.  The patient and family have been given the opportunity to ask questions and make suggestions.  Izola Price Mae 09/18/2011, 1:57 PM

## 2011-09-18 NOTE — ED Notes (Addendum)
Pt alert and oriented x4. Respirations even and unlabored, bilateral symmetrical rise and fall of chest. Skin warm and dry. In no acute distress. Denies needs.  md alerted pt has no PRN pain medication ordered
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
## 2011-09-18 NOTE — Progress Notes (Signed)
Patient ID: Zachary Steele, male   DOB: 1950-04-21, 61 y.o.   MRN: [id number] 61 year old [place] American male admitted for alcohol detox.  States he has been drinking a bottle of wine or more per day and sometimes more.  Also admits to smoking marijuana some, but states it really makes him feel worse.  Lives with wife, step child and 2 grandchildren.  Says the house is sometimes very stressful.  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  Says he has tried to stop drinking on his own at home in the past, but it has not gone well.  Has never been through detox/rehab in a controlled setting.  H/O CABG in 2010.  Patient is a smoker and says he is down to smoking 1 or 2 cigarettes per day in an attempt to stop completely.

## 2011-09-18 NOTE — ED Provider Notes (Signed)
Pt accepted at [facility] by Dora Sims PA for Dr Dan Humphreys.  Ward Givens, MD 09/18/11 1043

## 2011-09-18 NOTE — H&P (Signed)
Medical/psychiatric screening examination/treatment/procedure(s) were performed by non-physician practitioner and as supervising physician I was immediately available for consultation/collaboration.  I have seen and examined this patient and agree the major elements of this evaluation.  

## 2011-09-18 NOTE — [facility] Counselor (Signed)
PT HAS BEEN ACCEPTED OVER AT [facility] FROM ALAN WATT TO DR. Dan Humphreys. RM [id number]. SUPOORT PAPER WORK HAS BEEN DONE & PT IS BEING TRANSFERRED TO [facility].

## 2011-09-18 NOTE — ED Provider Notes (Signed)
Patient relates he was "coughing up blood" when he came to the ER. When further questioned he states he saw streaks of blood. Patient was reassured that was nothing to be alarmed about. Patient has had a chest x-ray done this ED visit showing COPD. 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 Patient states he feels shaky however no tremors noted. Patient states he's been drinking about a fifth of wine a day. He denies any suicidal ideation. Per [facility] patient has been accepted at behavioral health, however I have not been told to do his transfer paperwork. Patient also states he has not been written for pain, I will check and see if he has Motrin or Tylenol written.  Devoria Albe, MD, FACEP   Ward Givens, MD 09/18/11 [phone]
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
## 2011-09-19 ENCOUNTER — Ambulatory Visit (HOSPITAL_COMMUNITY)
Admit: 2011-09-19 | Discharge: 2011-09-19 | Disposition: A | Discharge status: Home / Self Care | Payer: 59 | Attending: Internal Medicine | Admitting: Internal Medicine

## 2011-09-19 DIAGNOSIS — K7689 Other specified diseases of liver: Secondary | ICD-10-CM | POA: Insufficient documentation

## 2011-09-19 DIAGNOSIS — B182 Chronic viral hepatitis C: Secondary | ICD-10-CM

## 2011-09-19 DIAGNOSIS — R42 Dizziness and giddiness: Secondary | ICD-10-CM

## 2011-09-19 DIAGNOSIS — B1920 Unspecified viral hepatitis C without hepatic coma: Secondary | ICD-10-CM | POA: Insufficient documentation

## 2011-09-19 DIAGNOSIS — J438 Other emphysema: Secondary | ICD-10-CM | POA: Insufficient documentation

## 2011-09-19 DIAGNOSIS — R911 Solitary pulmonary nodule: Secondary | ICD-10-CM | POA: Insufficient documentation

## 2011-09-19 DIAGNOSIS — K7460 Unspecified cirrhosis of liver: Secondary | ICD-10-CM | POA: Insufficient documentation

## 2011-09-19 DIAGNOSIS — R042 Hemoptysis: Secondary | ICD-10-CM | POA: Insufficient documentation

## 2011-09-19 DIAGNOSIS — M311 Thrombotic microangiopathy: Secondary | ICD-10-CM

## 2011-09-19 DIAGNOSIS — Z951 Presence of aortocoronary bypass graft: Secondary | ICD-10-CM | POA: Insufficient documentation

## 2011-09-19 DIAGNOSIS — F1020 Alcohol dependence, uncomplicated: Secondary | ICD-10-CM | POA: Insufficient documentation

## 2011-09-19 LAB — COMPREHENSIVE METABOLIC PANEL
ALT: 75 U/L — ABNORMAL HIGH (ref 0–53)
AST: 140 U/L — ABNORMAL HIGH (ref 0–37)
Albumin: 2.6 g/dL — ABNORMAL LOW (ref 3.5–5.2)
Alkaline Phosphatase: 129 U/L — ABNORMAL HIGH (ref 39–117)
BUN: 5 mg/dL — ABNORMAL LOW (ref 6–23)
CO2: 28 mEq/L (ref 19–32)
Calcium: 9.2 mg/dL (ref 8.4–10.5)
Chloride: 100 mEq/L (ref 96–112)
Creatinine, Ser: 0.68 mg/dL (ref 0.50–1.35)
GFR calc Af Amer: >90 mL/min (ref >90)
GFR calc non Af Amer: >90 mL/min (ref >90)
Glucose, Bld: 110 mg/dL — ABNORMAL HIGH (ref 70–99)
Potassium: 4.1 mEq/L (ref 3.5–5.1)
Sodium: 135 mEq/L (ref 135–145)
Total Bilirubin: 1.7 mg/dL — ABNORMAL HIGH (ref 0.3–1.2)
Total Protein: 7.1 g/dL (ref 6.0–8.3)

## 2011-09-19 LAB — HIV ANTIBODY (ROUTINE TESTING W REFLEX): HIV: NONREACTIVE

## 2011-09-19 LAB — CBC
HCT: 35.4 % — ABNORMAL LOW (ref 39.0–52.0)
Hemoglobin: 12.6 g/dL — ABNORMAL LOW (ref 13.0–17.0)
MCH: 36.3 pg — ABNORMAL HIGH (ref 26.0–34.0)
MCHC: 35.6 g/dL (ref 30.0–36.0)
MCV: 102 fL — ABNORMAL HIGH (ref 78.0–100.0)
Platelets: 38 10*3/uL — ABNORMAL LOW (ref 150–400)
RBC: 3.47 MIL/uL — ABNORMAL LOW (ref 4.22–5.81)
RDW: 13.5 % (ref 11.5–15.5)
WBC: 2.9 10*3/uL — ABNORMAL LOW (ref 4.0–10.5)

## 2011-09-19 MED ORDER — MAGNESIUM HYDROXIDE 400 MG/5ML PO SUSP: 30.0000 mL | Freq: Every day | ORAL | Status: DC | PRN

## 2011-09-19 MED ORDER — THIAMINE HCL 100 MG/ML IJ SOLN
100.0000 mg | Freq: Once | INTRAMUSCULAR | Status: AC
  Administered 2011-09-19: 100 mg via INTRAMUSCULAR

## 2011-09-19 MED ORDER — ONDANSETRON 4 MG PO TBDP: 4.0000 mg | ORAL_TABLET | Freq: Four times a day (QID) | ORAL | Status: AC | PRN

## 2011-09-19 MED ORDER — ACETAMINOPHEN 325 MG PO TABS
650.0000 mg | ORAL_TABLET | Freq: Four times a day (QID) | ORAL | Status: DC | PRN
  Administered 2011-09-20: 650 mg via ORAL

## 2011-09-19 MED ORDER — ALUM & MAG HYDROXIDE-SIMETH 200-200-20 MG/5ML PO SUSP: 30.0000 mL | ORAL | Status: DC | PRN

## 2011-09-19 MED ORDER — VITAMIN B-1 100 MG PO TABS
100.0000 mg | ORAL_TABLET | Freq: Every day | ORAL | Status: DC
  Filled 2011-09-19 (×2): qty 1

## 2011-09-19 MED ORDER — CHLORDIAZEPOXIDE HCL 25 MG PO CAPS: 25.0000 mg | ORAL_CAPSULE | Freq: Four times a day (QID) | ORAL | Status: DC | PRN

## 2011-09-19 MED ORDER — ADULT MULTIVITAMIN W/MINERALS CH
1.0000 | ORAL_TABLET | Freq: Every day | ORAL | Status: DC
  Filled 2011-09-19 (×3): qty 1

## 2011-09-19 MED ORDER — IOHEXOL 300 MG/ML  SOLN
100.0000 mL | Freq: Once | INTRAMUSCULAR | Status: AC | PRN
  Administered 2011-09-19: 100 mL via INTRAVENOUS

## 2011-09-19 MED ORDER — ASPIRIN 81 MG PO CHEW
81.0000 mg | CHEWABLE_TABLET | Freq: Every day | ORAL | Status: DC
  Administered 2011-09-19 – 2011-09-23 (×5): 81 mg via ORAL
  Filled 2011-09-19 (×3): qty 1
  Filled 2011-09-19: qty 14
  Filled 2011-09-19 (×3): qty 1

## 2011-09-19 MED ORDER — ENSURE COMPLETE PO LIQD
237.0000 mL | Freq: Two times a day (BID) | ORAL | Status: DC
  Administered 2011-09-19 – 2011-09-20 (×4): 237 mL via ORAL

## 2011-09-19 MED ORDER — HYDROXYZINE HCL 25 MG PO TABS: 25.0000 mg | ORAL_TABLET | Freq: Four times a day (QID) | ORAL | Status: DC | PRN

## 2011-09-19 MED ORDER — LOPERAMIDE HCL 2 MG PO CAPS: 2.0000 mg | ORAL_CAPSULE | ORAL | Status: AC | PRN

## 2011-09-19 NOTE — Progress Notes (Addendum)
[facility] MD Progress Note                                         09/19/2011    Zachary Steele July 09, 1950    [id number] Hospital day #1  Dx: Alcohol dependence The patient was seen today and reports the following:  Sleep:" Wonderful" Appetite: "improving"  Mild>(1-10) >Severe  Hopelessness (1-10):0 Depression (1-10): 1/10 Anxiety (1-10): 0 Suicidal Ideation: . Patient denies suicidal ideation, intent, means, or plans. Plan: None Intent: None Means:  None Homicidal Ideation: Patient denies homicidal ideation, intent, means or plans. Plan: None Intent: None Means: None  Eye Contact: Good.  General Appearance/Behavior: casual dress, behavior appropriate and cooperative Motor Behavior: normal Speech: clear  Mental Status:  Oriented x 3  Level of Consciousness:  alert Mood: euthymic Affect: congruent  Thought Process: normal Thought Content: no AH/VH Perception: intact, normal  Judgment: fair Insight: good Cognition: at least average  Sleep: Number of Hours: 5.75   Filed Vitals:   09/19/11 1134  BP: 100/69  Pulse: 80  Temp: 98.2 F (36.8 C)  Resp: 18     . aspirin  81 mg Oral Daily  . chlordiazePOXIDE  25 mg Oral QID   Followed by  . chlordiazePOXIDE  25 mg Oral TID   Followed by  . chlordiazePOXIDE  25 mg Oral BH-qamhs   Followed by  . chlordiazePOXIDE  25 mg Oral Daily  . ciprofloxacin  500 mg Oral BID  . feeding supplement  237 mL Oral BID BM  . multivitamin with minerals  1 tablet Oral Daily  . multivitamin with minerals  1 tablet Oral Daily  . pneumococcal 23 valent vaccine  0.5 mL Intramuscular Tomorrow-1000  . thiamine  100 mg Intramuscular Once  . thiamine  100 mg Oral Daily  . DISCONTD: thiamine  100 mg Oral Daily    Results for orders placed during the hospital encounter of 09/18/11 (from the past 48 hour(s))  CBC     Status: Abnormal   Collection Time   09/18/11  7:35 PM      Component Value Range Comment   WBC 3.3 (*) 4.0 - 10.5  (K/uL)    RBC 3.41 (*) 4.22 - 5.81 (MIL/uL)    Hemoglobin 12.6 (*) 13.0 - 17.0 (g/dL)    HCT 53.2 (*) 99.2 - 52.0 (%)    MCV 102.3 (*) 78.0 - 100.0 (fL)    MCH 37.0 (*) 26.0 - 34.0 (pg)    MCHC 36.1 (*) 30.0 - 36.0 (g/dL)    RDW 42.6  83.4 - 19.6 (%)    Platelets 37 (*) 150 - 400 (K/uL)   DIFFERENTIAL     Status: Normal   Collection Time   09/18/11  7:35 PM      Component Value Range Comment   Neutrophils Relative 54  43 - 77 (%)    Lymphocytes Relative 33  12 - 46 (%)    Monocytes Relative 9  3 - 12 (%)    Eosinophils Relative 3  0 - 5 (%)    Basophils Relative 1  0 - 1 (%)    Neutro Abs 1.8  1.7 - 7.7 (K/uL)    Lymphs Abs 1.1  0.7 - 4.0 (K/uL)    Monocytes Absolute 0.3  0.1 - 1.0 (K/uL)    Eosinophils Absolute 0.1  0.0 - 0.7 (K/uL)  Basophils Absolute 0.0  0.0 - 0.1 (K/uL)    RBC Morphology TARGET CELLS      Smear Review PLATELET COUNT CONFIRMED BY SMEAR     COMPREHENSIVE METABOLIC PANEL     Status: Abnormal   Collection Time   09/18/11  7:35 PM      Component Value Range Comment   Sodium 135  135 - 145 (mEq/L)    Potassium 4.4  3.5 - 5.1 (mEq/L)    Chloride 98  96 - 112 (mEq/L)    CO2 29  19 - 32 (mEq/L)    Glucose, Bld 136 (*) 70 - 99 (mg/dL)    BUN 5 (*) 6 - 23 (mg/dL)    Creatinine, Ser 1.61  0.50 - 1.35 (mg/dL)    Calcium 9.0  8.4 - 10.5 (mg/dL)    Total Protein 7.3  6.0 - 8.3 (g/dL)    Albumin 2.8 (*) 3.5 - 5.2 (g/dL)    AST 096 (*) 0 - 37 (U/L)    ALT 78 (*) 0 - 53 (U/L)    Alkaline Phosphatase 131 (*) 39 - 117 (U/L)    Total Bilirubin 1.7 (*) 0.3 - 1.2 (mg/dL)    GFR calc non Af Amer >90  >90 (mL/min)    GFR calc Af Amer >90  >90 (mL/min)   HIV ANTIBODY (ROUTINE TESTING)     Status: Normal   Collection Time   09/18/11  7:35 PM      Component Value Range Comment   HIV NON REACTIVE  NON REACTIVE    CBC     Status: Abnormal   Collection Time   09/19/11  6:10 AM      Component Value Range Comment   WBC 2.9 (*) 4.0 - 10.5 (K/uL)    RBC 3.47 (*) 4.22 - 5.81  (MIL/uL)    Hemoglobin 12.6 (*) 13.0 - 17.0 (g/dL)    HCT 04.5 (*) 40.9 - 52.0 (%)    MCV 102.0 (*) 78.0 - 100.0 (fL)    MCH 36.3 (*) 26.0 - 34.0 (pg)    MCHC 35.6  30.0 - 36.0 (g/dL)    RDW 81.1  91.4 - 78.2 (%)    Platelets 38 (*) 150 - 400 (K/uL) CONSISTENT WITH PREVIOUS RESULT  COMPREHENSIVE METABOLIC PANEL     Status: Abnormal   Collection Time   09/19/11  6:10 AM      Component Value Range Comment   Sodium 135  135 - 145 (mEq/L)    Potassium 4.1  3.5 - 5.1 (mEq/L)    Chloride 100  96 - 112 (mEq/L)    CO2 28  19 - 32 (mEq/L)    Glucose, Bld 110 (*) 70 - 99 (mg/dL)    BUN 5 (*) 6 - 23 (mg/dL)    Creatinine, Ser 9.56  0.50 - 1.35 (mg/dL)    Calcium 9.2  8.4 - 10.5 (mg/dL)    Total Protein 7.1  6.0 - 8.3 (g/dL)    Albumin 2.6 (*) 3.5 - 5.2 (g/dL)    AST 213 (*) 0 - 37 (U/L)    ALT 75 (*) 0 - 53 (U/L)    Alkaline Phosphatase 129 (*) 39 - 117 (U/L)    Total Bilirubin 1.7 (*) 0.3 - 1.2 (mg/dL)    GFR calc non Af Amer >90  >90 (mL/min)    GFR calc Af Amer >90  >90 (mL/min)     No results found for this or any previous visit (from the past  48 hour(s)).  ROS:    Constitutional: WDWN AAF NAD   GI: Negative for N,V,D,C   Neuro: Negative for dizziness, blurred vision, visual changes, headaches + for postural vertigo   Resp: Negative for wheezing, SOB, cough   Cardio: Negative for CP, diaphoresis, fatigue.  +tachycardia at 125.  See IM consult.   MSK: Negative for joint pain, swelling, DROM, or ambulatory difficulties.  Time was spent with the patient discussing his symptoms, medications, medical problems, and his response to detox.  He is informed of the negative HIV status,and he is pleased.He is denying symptoms of withdrawal today.  His tremors have resolved.  Extensive psycho-education is done regarding treatment of his alcoholism and what will happen or should happen upon his discharge.  He is much more motivated to succeed in pursing his sobriety after our  discussion.   Treatment Plan Summary:  1. Daily contact with patient to assess and evaluate symptoms and progress in treatment.  2. Medication management  3. The patient will deny suicidal ideations or homicidal ideations for 48 hours prior to discharge and have a depression and anxiety rating of 3 or less. The patient will also deny any auditory or visual hallucinations or delusional thinking.  4. The patient will deny any symptoms of substance withdrawal at time of discharge.   Plan: 1. Alcohol detox with librium protocol. 2. Medical problems will be addressed appropriately. 3. Will get IM consult as noted due to decreased platelets and reports of dizziness. 4. Labs as indicated. 5. Continue to monitor.  Rona Ravens. Keirsten Matuska [facility] 09/19/2011

## 2011-09-19 NOTE — Progress Notes (Addendum)
Patient seen during during d/c planning group and or treatment team.  He advised of drinking several bottles of wine daily for almost fifty years.  He denies SI/HI.  He shared that he is interested in getting into a residential treatment program at discharge.  Patient stated that he has not been able to eat and is having medical problems as a result of the ETOH.  A referral was made to [facility] [place] for admission early next week.  Awaiting confirmation of an admission date. Per patient request, employer notified of hospitalization.

## 2011-09-19 NOTE — Progress Notes (Addendum)
Zachary Steele is seen OOB UAL on the unit...tolerated well. He is quite pleasant. He denies all aspects of withdrawal symptoms and denies need for prn medication when this nurse offers it to him . He makes good eye contact. Attends his groups. Takes his meds as ordered. He  Is flat and depressed, yet able to articulate that he knows  He needs to be here. IM MD , Dr Brien Few visited pt today and has  planned a chest angiogram today..which is scheduled at 1600. PA requests MHT to accompany pt . This is to F/u with pt's cc ( prior to admission ) of hemoptysis. Pt completed his self inventory and on it he wrote he denied SI, and that his DC plan was to : " stay away from alcohol".  POC includes continuing to foster therapeutic relationsip and to process with pt. PD RN [facility]

## 2011-09-19 NOTE — Progress Notes (Signed)
Patient ID: Zachary Steele, male   DOB: [birth year], 61 y.o.   MRN: [id number] Pt. was accompanied to [facility] for CT scan procedure by MHT and security personnel. 18:15--Pt. Seen in his room awake, alert and oriented X4: Pt. Stated he felt fine, "I've had this done before." 22:55--Pt. rec'd his pneumonia and B-1 injections one in each hip.  Pt. Took his PO meds earlier also and went to bed.

## 2011-09-19 NOTE — B[facility] Counselor (Signed)
Adult Comprehensive Assessment  Patient ID: Zachary Steele, male   DOB: Aug 24, 1950, 61 y.o.   MRN: [id number]  Information Source: Information source: Patient  Current Stressors:  Educational / Learning stressors: no stressors reported Employment / Job issues: new boss at work and does not like him, boss does not want to reduce his hours for [employer], feels targeted by boss Family Relationships: stressful home situation Surveyor, quantity / Lack of resources (include bankruptcy): job hours reduced from 40 to 20 in order for him to be in compliance with Social Security Disability/Retirement Housing / Lack of housing: no stressors reported Physical health (include injuries & life threatening diseases): Hepatitis C, COPD Social relationships: no stressors reported Substance abuse: alcohol dependence, marijuana use Bereavement / Loss: mother recently died (last 01/14/23)  Living/Environment/Situation:  Living Arrangements: Spouse/significant other;Children Living conditions (as described by patient or guardian): lives with wife, grandchildren How long has patient lived in current situation?: 20+ years What is atmosphere in current home: Comfortable  Family History:  Marital status: Married Number of Years Married: 23  What types of issues is patient dealing with in the relationship?: supportive  Does patient have children?: Yes How many children?: 2  How is patient's relationship with their children?: pretty good with son and daughter  Childhood History:  By whom was/is the patient raised?: Mother Description of patient's relationship with caregiver when they were a child: close with mother Patient's description of current relationship with people who raised him/her: both parents deceased Does patient have siblings?: Yes Number of Siblings: 7  Description of patient's current relationship with siblings: 5 sisters, 2 brothers, not raised togethernot in touch with them Did patient  suffer any verbal/emotional/physical/sexual abuse as a child?: No Did patient suffer from severe childhood neglect?: No Has patient ever been sexually abused/assaulted/raped as an adolescent or adult?: No Was the patient ever a victim of a crime or a disaster?: No Witnessed domestic violence?: No Has patient been effected by domestic violence as an adult?: No  Education:  Highest grade of school patient has completed: did not finish high school Currently a Consulting civil engineer?: No Learning disability?: No  Employment/Work Situation:   Employment situation: Employed 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 [employer] long has patient been employed?: 5 years Patient's job has been impacted by current illness: Yes Describe how patient's job has been implacted: problems with new boss What is the longest time patient has a held a job?: 8 years 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 Has patient ever been in the [employer]?: No Has patient ever served in Buyer, retail?: No  Financial Resources:   Financial resources: Income from employment;Receives SSDI Does patient have a representative payee or guardian?: No  Alcohol/Substance Abuse:   What has been your use of drugs/alcohol within the last 12 months?: wine-  has been drinking more heavily since mother's death (several bottles daily then went back down to a bottle a day); marijuana - smokes a few times a week but does not like the way it makes him feel If attempted suicide, did drugs/alcohol play a role in this?: No (no suicidality, no attempt) Alcohol/Substance Abuse Treatment Hx: Denies past history If yes, describe treatment: has been drinking since age 52 and has never had detox or rehab, recognizes that he is dependent on alcohol now - started drinking first thing in the morning and gets very sick when he does not drink  Has alcohol/substance abuse ever caused legal problems?: Yes (jailed 1  and 1/2 years for DWI many  years ago)  Social Support System:   Patient's Community Support System: Fair [employer]/gallery exhibitions officer System: wife, adult children  Type of faith/religion: Ephriam Knuckles How does patient's faith help to cope with current illness?: prayer  Leisure/Recreation:   Leisure and Hobbies: singer   Strengths/Needs:   What things does the patient do well?: makes a good janitor, easy to get along with, good friend In what areas does patient struggle / problems for patient: drinking more since mother died, having trouble if he is unable to drink, a lot of problems at work with new boss and feels discriminated against, stomach problems from the drinking and not eating  Discharge Plan:   Does patient have access to transportation?: Yes Will patient be returning to same living situation after discharge?: Yes Currently receiving community mental health services: No If no, would patient like referral for services when discharged?: Yes (What county?) Does patient have financial barriers related to discharge medications?: No  Summary/Recommendations:   Summary and Recommendations (to be completed by the evaluator): Sims is a 61 year old married male diagnosed with Alcohol Dependence and Substance Induced Mood Disorder. He reports no depression or anxiety, only alcohol use. The use increased when his mother died, and he has become incredibly ill when he tried to stop drininkg. Likewise, he is having health problems - including inability to eat - which doctors have told him relate to his drinking. Brailon would benefit from crisis stabilization, meedication evaluation, therapy groups for processing thoughts/feelings/experiences, psychoed groups for coping skills and case managment for discharge planning.    Lyn Hollingshead, Lyndee Hensen. 09/19/2011

## 2011-09-19 NOTE — Progress Notes (Signed)
[facility] Group Notes:  (Counselor/Nursing/MHT/Case Management/Adjunct) 09/19/2011  11:00am Preventing Relapse   Type of Therapy:  Group Therapy  Participation Level:  Did Not Attend     Zachary Steele 09/19/2011   3:59 PM          [facility] Group Notes:  (Counselor/Nursing/MHT/Case Management/Adjunct) 09/19/2011   1:15pm [facility] [place]   Type of Therapy:  Group Therapy  Participation Level:  Did Not Attend     Zachary Steele 09/19/2011   3:59 PM

## 2011-09-19 NOTE — Progress Notes (Signed)
09/19/2011         Time: 1415      Group Topic/Focus: The focus of this group is on enhancing patients' problem solving skills, which involves identifying the problem, brainstorming solutions and choosing and trying a solution.   Participation Level: Active  Participation Quality: Redirectable  Affect: Irritable  Cognitive: Oriented  Additional Comments: Patient irritable at first, says he is waiting for discharge. Patient easily redirected with humor, did well with problem solving activity, shared some concern he may lose his job while admitted.   Tametra Ahart 09/19/2011 3:42 PM

## 2011-09-19 NOTE — Progress Notes (Signed)
Subjective: Asymptomatic.  Objective: Vital signs in last 24 hours: Temp:  [97.8 F (36.6 C)-98.6 F (37 C)] 98.6 F (37 C) (06/07 0600) Pulse Rate:  [66-125] 125  (06/07 0601) Resp:  [16] 16  (06/07 0600) BP: (88-124)/(51-86) 102/74 mmHg (06/07 0601) SpO2:  [99 %] 99 % (06/06 1044) Weight:  [57.153 kg (126 lb)] 57.153 kg (126 lb) (06/06 1135) Weight change:  Last BM Date: 09/18/11  Intake/Output from previous day:       Physical Exam: General: Patient does not appear to be in obvious acute distress. Alert, communicative, fully oriented, talking in complete sentences, not short of breath at rest.  HEENT: No clinical pallor, no jaundice, no conjunctival injection or discharge.  NECK: Supple, JVP not seen, no carotid bruits, no palpable lymphadenopathy, no palpable goiter.  CHEST: Clinically clear to auscultation, no wheezes, no crackles. Well healed midline sternotomy scar is noted.  HEART: Sounds 1 and 2 heard, normal, regular, no murmurs.  ABDOMEN: Flat, soft, non-tender, no palpable organomegaly, no palpable masses, normal bowel sounds.  GENITALIA: Not examined.  LOWER EXTREMITIES: No pitting edema, palpable peripheral pulses.  MUSCULOSKELETAL SYSTEM: Generalized osteoarthritic changes, otherwise, normal.  CENTRAL NERVOUS SYSTEM: No focal neurologic deficit on gross examination.  Lab Results:  [facility] 09/19/11 0610 09/18/11 1935  WBC 2.9* 3.3*  HGB 12.6* 12.6*  HCT 35.4* 34.9*  PLT 38* 37*    Basename 09/19/11 0610 09/18/11 1935  NA 135 135  K 4.1 4.4  CL 100 98  CO2 28 29  GLUCOSE 110* 136*  BUN 5* 5*  CREATININE 0.68 0.66  CALCIUM 9.2 9.0   No results found for this or any previous visit (from the past 240 hour(s)).   Studies/Results: Dg Chest 2 View  09/17/2011  *RADIOLOGY REPORT*  Clinical Data: Smoker, intermittent cough for 1 year, shortness of breath, prior open heart surgery  CHEST - 2 VIEW  Comparison: 11/14/2009  Findings: Normal heart size post  CABG. Mediastinal contours and pulmonary vascularity normal. Atherosclerotic calcification aorta. Calcified granuloma lingula. Emphysematous and chronic bronchitic changes. No acute infiltrate, pleural effusion, pneumothorax. Old bilateral rib fractures. Old left scapular fracture. Chronic nodular opacity identified in mid left chest, 13 mm diameter, unchanged since 2005, likely associated with the posterior left eighth rib on an old CT exam.  IMPRESSION: Emphysematous, bronchitic, and old granulomatous disease changes. No acute abnormalities. Multiple old bilateral rib and left scapular fractures.  Original Report Authenticated By: Lollie Marrow, M.D.    Medications: Scheduled Meds:   . chlordiazePOXIDE  25 mg Oral QID   Followed by  . chlordiazePOXIDE  25 mg Oral TID   Followed by  . chlordiazePOXIDE  25 mg Oral BH-qamhs   Followed by  . chlordiazePOXIDE  25 mg Oral Daily  . ciprofloxacin  500 mg Oral BID  . feeding supplement  237 mL Oral BID BM  . multivitamin with minerals  1 tablet Oral Daily  . pneumococcal 23 valent vaccine  0.5 mL Intramuscular Tomorrow-1000  . thiamine  100 mg Intramuscular Once  . thiamine  100 mg Oral Daily   Continuous Infusions:  PRN Meds:.acetaminophen, alum & mag hydroxide-simeth, chlordiazePOXIDE, hydrOXYzine, loperamide, magnesium hydroxide, ondansetron  Assessment/Plan:  1. Dizziness: I had a detailed discussion with patient about his symptoms, and he assures me that he has had dizziness whenever he bends over, for several years. He denies gait unsteadiness or falls. As a matter of fact, he has learnt over the years, to bend over with care,  to ameliorate this symptom. Certainly, orthostatic BP testing, does not reveal postural hypotension, and hydration status is satisfactory. No further work up is indicated at this time. 2. Hemoptysis: Per patient, he had a single episode on 09/16/11, and none since. He denies chest pain or shortness of breath. CXR of  09/17/11, shows emphysematous, bronchitic, and old granulomatous disease changes, and no acute abnormalities. The significance of a single episode of hemoptysis in this patient with chronic thrombocytopenia is uncertain, but he does have a long-term smoking history, and has experienced some weight loss. A chest CT CT angiogram has been ordered, and is pending. Patient has had no recurrence, however. 3. Alcohol addiction: Patient has a known history of chronic alcohol abuse, and currently drinks at least a bottle of wine a day. He was admitted for detoxification on 09/18/11, and is now on Librium protocol. At present, he has no evidence of ETOH withdrawal phenomena. Will defer management to primary team.  4. Liver disease: Patient has a history of hepatitis C, diagnosed in 04/2009, and as a matter of fact, his HCV RNA quantitative, was 11,1000 on 10/07/09, per EMR. This will account for his bicytopenia and his abnormal LFTs are attributable to multiple factors, including chronic hepatitis C, alcohol abuse, and hepatic steatosis, confirmed on abdominal MRI 05/20/10. Unfortunately, patient as had no follow up for hepatitis C, since diagnosis. Perhaps, an appropriate referral can be obtained via his PMD, Dr Juanita Craver, at [facility], on discharge, and should certainly, be factored into his discharge planning.  5. Thrombocytopenia/Bicytopenia: Patient has known chronic thrombocytopenia, which as remained stable. Review of EMR, reveals that platelet count has ranged at 36,000 to 59,000, from 09/2009 to 09/2010. Platelet count was 51,000 on 09/17/11, and as such, is at baseline, with no overt evidence of bleeding. Patient has a mild leukopenia in addition, and the likely etiology of these abnormalities, is his hepatitis C. HIV test is negative, and platelet count appears close to baseline, at 38,000 today.  6. CAD: Patient appears asymptomatic, without chest pain or shortness of breath. His primary cardiologist is Dr Armanda Magic,  although he has not seen her for several years. He appears clinically stable from this view point.  7. UTI (urinary tract infection): Positive urinary sediment, with significant bacteriuria. He was commenced on ciprofloxacin by primary team, and I concur with this approach. Urine cultures are negative so far..      LOS: 1 day   Demone Lyles,CHRISTOPHER 09/19/2011, 10:37 AM

## 2011-09-19 NOTE — Tx Team (Signed)
Interdisciplinary Treatment Plan Update (Adult)  Date:  09/19/2011  Time Reviewed:  10:24 AM   Progress in Treatment: Attending groups: Yes Participating in groups:  Yes, open in group Taking medication as prescribed:  Yes Tolerating medication: Yes Family/Significant othe contact made:  No, requesting consent to contact supports Patient understands diagnosis: Yes Discussing patient identified problems/goals with staff:  Yes Medical problems stabilized or resolved: Yes Denies suicidal/homicidal ideation: Yes Issues/concerns per patient self-inventory:  No  Other:  New problem(s) identified: None  Reason for Continuation of Hospitalization: Anxiety Depression Medication stabilization  Interventions implemented related to continuation of hospitalization:  Medication stabilization, safety checks q 15 mins, group attendance  Additional comments:  Estimated length of stay:  Discharge Plan:  New goal(s):  Review of initial/current patient goals per problem list:   1.  Goal(s): Decrease rating of depressive symptoms to 4 or less  Met:  Yes  Target date: by discharge  As evidenced by: Zachary Steele rates his depression at 0 today  2.  Goal (s):  Detox from alcohol  Met:  No  Target date: by discharge  As evidenced by: Zachary Steele has just begun Librium detox  3.  Goal(s): Decrease rating of anxiety symptoms to 4 or less  Met:  Yes  Target date: by discharge  As evidenced by:  Zachary Steele rates his anxiety at 0 today  4.  Goal(s): Refer for follow up treatment  Met:  No   Target date: by discharge  As evidenced by: Referral will be made to [facility] [place]   Attendees: Patient:     Family:     Physician:     Nursing:     Case Manager:  Juline Patch, LCSW 09/19/2011 10:24 AM  Counselor:  Angus Palms, LCSW 09/19/2011 10:24 AM  Other:  Shelda Jakes, NP 09/19/2011 10:24 AM  Other:     Other:     Other:      Scribe for Treatment Team:   Billie Lade, 09/19/2011 10:24 AM

## 2011-09-20 DIAGNOSIS — R42 Dizziness and giddiness: Secondary | ICD-10-CM

## 2011-09-20 DIAGNOSIS — R042 Hemoptysis: Secondary | ICD-10-CM

## 2011-09-20 DIAGNOSIS — B182 Chronic viral hepatitis C: Secondary | ICD-10-CM

## 2011-09-20 DIAGNOSIS — M3130 Wegener's granulomatosis without renal involvement: Secondary | ICD-10-CM

## 2011-09-20 LAB — URINE CULTURE
Colony Count: NO GROWTH
Culture  Setup Time: 201306071943
Culture: NO GROWTH

## 2011-09-20 MED ORDER — ENSURE COMPLETE PO LIQD
237.0000 mL | Freq: Three times a day (TID) | ORAL | Status: DC
  Administered 2011-09-20 – 2011-09-23 (×9): 237 mL via ORAL

## 2011-09-20 NOTE — Progress Notes (Signed)
[facility] Group Notes:  (Counselor/Nursing/MHT/Case Management/Adjunct)  09/20/2011 5:38 PM  Type of Therapy:  Group Therapy  Participation Level:  Did Not Attend   Neila Gear 09/20/2011, 5:38 PM

## 2011-09-20 NOTE — Progress Notes (Signed)
Pt. Not feeling physically well today. States that he took a medication that made him very dizzy. Almost passed out at the medication window. Pushing fluids on Pt througtout the day. Encouraging Pt. To drink his liquids, go from a lying position to a sitting position and then a standing position, waiting at least 30 seconds in between. Given support and monitored for detox symptoms. Denies SI and HI. Faythe Dingwall was ordered for Pt and will be coming from [facility] [facility].

## 2011-09-20 NOTE — ED Provider Notes (Signed)
  I performed a history and physical examination of Zachary Steele and discussed his management with Ms. Drue Novel.  I agree with the history, physical, assessment, and plan of care, with the following exceptions: None  I was present for the following procedures: None Time Spent in Critical Care of the patient: None Time spent in discussions with the patient and family: 10  Pattrick Bady Corlis Leak, MD 09/20/11 [phone]

## 2011-09-20 NOTE — Progress Notes (Signed)
[facility] MD Progress Note  09/20/2011 4:03 PM  Diagnosis:   Axis I: Alcohol Abuse and Substance Induced Mood Disorder Axis II: Deferred Axis III:  Past Medical History  Diagnosis Date  . Anemia   . Cataract   . Hepatitis C     Has A and B also  . PAD (peripheral artery disease)     pt reports he has this and has chronic leg "soreness"  . Alcohol dependence    Subjective: Zachary Steele was lying in bed this afternoon complaining that his medications are making him feel sick. He states that he wants to sleep all the time, and is having trouble with lightheadedness and a sense that he may faint upon standing. He also reports that he has not been able to well. He denies any cravings for alcohol, but does endorse some tremors. He denies any suicidal or homicidal ideation. He denies any auditory or visual hallucinations.  ADL's:  Impaired  Sleep: Good  Appetite:  Fair  Suicidal Ideation:  Denies Homicidal Ideation:  Denies  AEB (as evidenced by):  Mental Status Examination/Evaluation: Objective:  Appearance: Disheveled  Eye Contact::  Good  Speech:  Clear and Coherent  Volume:  Normal  Mood:  Dysphoric  Affect:  Congruent  Thought Process:  Linear  Orientation:  Full  Thought Content:  WDL and Delusions  Suicidal Thoughts:  No  Homicidal Thoughts:  No  Memory:  Immediate;   Good Recent;   Good Remote;   Good  Judgement:  Fair  Insight:  Fair  Psychomotor Activity:  Decreased  Concentration:  Good  Recall:  Fair  Akathisia:  No  Handed:    AIMS (if indicated):     Assets:  Desire for Improvement  Sleep:  Number of Hours: 5.75    Vital Signs:Blood pressure 87/66, pulse 119, temperature 96.6 F (35.9 C), temperature source Oral, resp. rate 18, height 5\' 7"  (1.702 m), weight 57.153 kg (126 lb). Current Medications: Current Facility-Administered Medications  Medication Dose Route Frequency Provider Last Rate Last Dose  . acetaminophen (TYLENOL) tablet 650 mg  650 mg Oral Q6H  PRN Jorje Guild, PA-C   650 mg at 09/20/11 1610  . acetaminophen (TYLENOL) tablet 650 mg  650 mg Oral Q6H PRN Verne Spurr, PA-C      . alum & mag hydroxide-simeth (MAALOX/MYLANTA) 200-200-20 MG/5ML suspension 30 mL  30 mL Oral Q4H PRN Jorje Guild, PA-C      . alum & mag hydroxide-simeth (MAALOX/MYLANTA) 200-200-20 MG/5ML suspension 30 mL  30 mL Oral Q4H PRN Verne Spurr, PA-C      . aspirin chewable tablet 81 mg  81 mg Oral Daily Jorje Guild, PA-C   81 mg at 09/20/11 0800  . chlordiazePOXIDE (LIBRIUM) capsule 25 mg  25 mg Oral Q6H PRN Jorje Guild, PA-C      . chlordiazePOXIDE (LIBRIUM) capsule 25 mg  25 mg Oral Q6H PRN Verne Spurr, PA-C      . chlordiazePOXIDE (LIBRIUM) capsule 25 mg  25 mg Oral QID Verne Spurr, PA-C   25 mg at 09/19/11 2226   Followed by  . chlordiazePOXIDE (LIBRIUM) capsule 25 mg  25 mg Oral TID Verne Spurr, PA-C       Followed by  . chlordiazePOXIDE (LIBRIUM) capsule 25 mg  25 mg Oral BH-qamhs Verne Spurr, PA-C       Followed by  . chlordiazePOXIDE (LIBRIUM) capsule 25 mg  25 mg Oral Daily Verne Spurr, PA-C      . ciprofloxacin (  CIPRO) tablet 500 mg  500 mg Oral BID Verne Spurr, PA-C   500 mg at 09/20/11 0901  . feeding supplement (ENSURE COMPLETE) liquid 237 mL  237 mL Oral BID BM Laveda Norman, MD   237 mL at 09/20/11 1545  . loperamide (IMODIUM) capsule 2-4 mg  2-4 mg Oral PRN Jorje Guild, PA-C      . loperamide (IMODIUM) capsule 2-4 mg  2-4 mg Oral PRN Verne Spurr, PA-C      . magnesium hydroxide (MILK OF MAGNESIA) suspension 30 mL  30 mL Oral Daily PRN Jorje Guild, PA-C      . magnesium hydroxide (MILK OF MAGNESIA) suspension 30 mL  30 mL Oral Daily PRN Verne Spurr, PA-C      . multivitamin with minerals tablet 1 tablet  1 tablet Oral Daily Verne Spurr, PA-C   1 tablet at 09/20/11 0800  . ondansetron (ZOFRAN-ODT) disintegrating tablet 4 mg  4 mg Oral Q6H PRN Jorje Guild, PA-C      . ondansetron (ZOFRAN-ODT) disintegrating tablet 4 mg  4 mg Oral Q6H PRN Verne Spurr, PA-C      . pneumococcal 23 valent vaccine (PNU-IMMUNE) injection 0.5 mL  0.5 mL Intramuscular Tomorrow-1000 Mike Craze, MD   0.5 mL at 09/19/11 2246  . thiamine (B-1) injection 100 mg  100 mg Intramuscular Once Jorje Guild, PA-C   100 mg at 09/19/11 2246  . thiamine (VITAMIN B-1) tablet 100 mg  100 mg Oral Daily Verne Spurr, PA-C   100 mg at 09/20/11 0800  . DISCONTD: hydrOXYzine (ATARAX/VISTARIL) tablet 25 mg  25 mg Oral Q6H PRN Jorje Guild, PA-C      . DISCONTD: hydrOXYzine (ATARAX/VISTARIL) tablet 25 mg  25 mg Oral Q6H PRN Verne Spurr, PA-C      . DISCONTD: multivitamin with minerals tablet 1 tablet  1 tablet Oral Daily Jorje Guild, PA-C       Facility-Administered Medications Ordered in Other Encounters  Medication Dose Route Frequency Provider Last Rate Last Dose  . iohexol (OMNIPAQUE) 300 MG/ML solution 100 mL  100 mL Intravenous Once PRN Medication Radiologist, MD   100 mL at 09/19/11 1719    Lab Results:  Results for orders placed during the hospital encounter of 09/18/11 (from the past 48 hour(s))  CBC     Status: Abnormal   Collection Time   09/18/11  7:35 PM      Component Value Range Comment   WBC 3.3 (*) 4.0 - 10.5 (K/uL)    RBC 3.41 (*) 4.22 - 5.81 (MIL/uL)    Hemoglobin 12.6 (*) 13.0 - 17.0 (g/dL)    HCT 16.1 (*) 09.6 - 52.0 (%)    MCV 102.3 (*) 78.0 - 100.0 (fL)    MCH 37.0 (*) 26.0 - 34.0 (pg)    MCHC 36.1 (*) 30.0 - 36.0 (g/dL)    RDW 04.5  40.9 - 81.1 (%)    Platelets 37 (*) 150 - 400 (K/uL)   DIFFERENTIAL     Status: Normal   Collection Time   09/18/11  7:35 PM      Component Value Range Comment   Neutrophils Relative 54  43 - 77 (%)    Lymphocytes Relative 33  12 - 46 (%)    Monocytes Relative 9  3 - 12 (%)    Eosinophils Relative 3  0 - 5 (%)    Basophils Relative 1  0 - 1 (%)    Neutro Abs 1.8  1.7 -  7.7 (K/uL)    Lymphs Abs 1.1  0.7 - 4.0 (K/uL)    Monocytes Absolute 0.3  0.1 - 1.0 (K/uL)    Eosinophils Absolute 0.1  0.0 - 0.7 (K/uL)     Basophils Absolute 0.0  0.0 - 0.1 (K/uL)    RBC Morphology TARGET CELLS      Smear Review PLATELET COUNT CONFIRMED BY SMEAR     COMPREHENSIVE METABOLIC PANEL     Status: Abnormal   Collection Time   09/18/11  7:35 PM      Component Value Range Comment   Sodium 135  135 - 145 (mEq/L)    Potassium 4.4  3.5 - 5.1 (mEq/L)    Chloride 98  96 - 112 (mEq/L)    CO2 29  19 - 32 (mEq/L)    Glucose, Bld 136 (*) 70 - 99 (mg/dL)    BUN 5 (*) 6 - 23 (mg/dL)    Creatinine, Ser 0.98  0.50 - 1.35 (mg/dL)    Calcium 9.0  8.4 - 10.5 (mg/dL)    Total Protein 7.3  6.0 - 8.3 (g/dL)    Albumin 2.8 (*) 3.5 - 5.2 (g/dL)    AST 119 (*) 0 - 37 (U/L)    ALT 78 (*) 0 - 53 (U/L)    Alkaline Phosphatase 131 (*) 39 - 117 (U/L)    Total Bilirubin 1.7 (*) 0.3 - 1.2 (mg/dL)    GFR calc non Af Amer >90  >90 (mL/min)    GFR calc Af Amer >90  >90 (mL/min)   HIV ANTIBODY (ROUTINE TESTING)     Status: Normal   Collection Time   09/18/11  7:35 PM      Component Value Range Comment   HIV NON REACTIVE  NON REACTIVE    CBC     Status: Abnormal   Collection Time   09/19/11  6:10 AM      Component Value Range Comment   WBC 2.9 (*) 4.0 - 10.5 (K/uL)    RBC 3.47 (*) 4.22 - 5.81 (MIL/uL)    Hemoglobin 12.6 (*) 13.0 - 17.0 (g/dL)    HCT 14.7 (*) 82.9 - 52.0 (%)    MCV 102.0 (*) 78.0 - 100.0 (fL)    MCH 36.3 (*) 26.0 - 34.0 (pg)    MCHC 35.6  30.0 - 36.0 (g/dL)    RDW 56.2  13.0 - 86.5 (%)    Platelets 38 (*) 150 - 400 (K/uL) CONSISTENT WITH PREVIOUS RESULT  COMPREHENSIVE METABOLIC PANEL     Status: Abnormal   Collection Time   09/19/11  6:10 AM      Component Value Range Comment   Sodium 135  135 - 145 (mEq/L)    Potassium 4.1  3.5 - 5.1 (mEq/L)    Chloride 100  96 - 112 (mEq/L)    CO2 28  19 - 32 (mEq/L)    Glucose, Bld 110 (*) 70 - 99 (mg/dL)    BUN 5 (*) 6 - 23 (mg/dL)    Creatinine, Ser 7.84  0.50 - 1.35 (mg/dL)    Calcium 9.2  8.4 - 10.5 (mg/dL)    Total Protein 7.1  6.0 - 8.3 (g/dL)    Albumin 2.6 (*) 3.5 - 5.2  (g/dL)    AST 696 (*) 0 - 37 (U/L)    ALT 75 (*) 0 - 53 (U/L)    Alkaline Phosphatase 129 (*) 39 - 117 (U/L)    Total Bilirubin 1.7 (*) 0.3 - 1.2 (mg/dL)  GFR calc non Af Amer >90  >90 (mL/min)    GFR calc Af Amer >90  >90 (mL/min)     Physical Findings: AIMS:  , ,  ,  ,    CIWA:  CIWA-Ar Total: 2  COWS:     Treatment Plan Summary: Daily contact with patient to assess and evaluate symptoms and progress in treatment Medication management  Plan: We will continue his Librium detox taper her but hold doses if the patient's blood pressure is hypotensive, or if he is sedated. We will encourage the patient to hydrate orally with a sports drink, and increase his dietary supplement of Ensure. He is encouraged to try to eat solid food as tolerable. We'll continue to monitor him closely for any change.  Zachary Steele 09/20/2011, 4:03 PM

## 2011-09-20 NOTE — Progress Notes (Signed)
Subjective: Felt light-headed this morning, after receiving a "blue-green" pill.  Objective: Vital signs in last 24 hours: Temp:  [96.6 F (35.9 C)] 96.6 F (35.9 C) (06/08 0700) Pulse Rate:  [98-119] 119  (06/08 0701) Resp:  [18] 18  (06/08 0700) BP: (87-96)/(63-66) 87/66 mmHg (06/08 0701) Weight change:  Last BM Date: 09/18/11  Intake/Output from previous day:       Physical Exam: General: Lying in bed comfortably, in no obvious acute distress. Alert, communicative, fully oriented, talking in complete sentences, not short of breath at rest.  HEENT: No clinical pallor, no jaundice, no conjunctival injection or discharge.  NECK: Supple, JVP not seen, no carotid bruits, no palpable lymphadenopathy, no palpable goiter.  CHEST: Clinically clear to auscultation, no wheezes, no crackles. Well healed midline sternotomy scar is noted.  HEART: Sounds 1 and 2 heard, normal, regular, no murmurs.  ABDOMEN: Flat, soft, non-tender, no palpable organomegaly, no palpable masses, normal bowel sounds.  GENITALIA: Not examined.  LOWER EXTREMITIES: No pitting edema, palpable peripheral pulses.  MUSCULOSKELETAL SYSTEM: Generalized osteoarthritic changes, otherwise, normal.  CENTRAL NERVOUS SYSTEM: No focal neurologic deficit on gross examination.  Lab Results:  [facility] 09/19/11 0610 09/18/11 1935  WBC 2.9* 3.3*  HGB 12.6* 12.6*  HCT 35.4* 34.9*  PLT 38* 37*    Basename 09/19/11 0610 09/18/11 1935  NA 135 135  K 4.1 4.4  CL 100 98  CO2 28 29  GLUCOSE 110* 136*  BUN 5* 5*  CREATININE 0.68 0.66  CALCIUM 9.2 9.0   No results found for this or any previous visit (from the past 240 hour(s)).   Studies/Results: Ct Angio Chest W/cm &/or Wo Cm  09/19/2011  *RADIOLOGY REPORT*  Clinical Data: Hemoptysis.  Rule out pulmonary embolism.  CT ANGIOGRAPHY CHEST  Technique:  Multidetector CT imaging of the chest using the standard protocol during bolus administration of intravenous contrast.  Multiplanar reconstructed images including MIPs were obtained and reviewed to evaluate the vascular anatomy.  Contrast: OMNIPAQUE IOHEXOL 300 MG/ML  SOLN  Comparison: Plain films of 09/17/2011.  Limited chest CT of 05/02/2008.  Findings: Lung windows demonstrate minimal motion degradation in the mid chest.  Paraseptal and centrilobular emphysema which is mild.  Calcified lingular nodule, consistent with old granulomatous disease.  Mild volume loss in the left lung base.  Soft tissue windows:  The quality of this exam for evaluation of pulmonary embolism is good. No evidence of pulmonary embolism. Advanced aortic and branch vessel atherosclerosis.  Normal heart size with prior median sternotomy for CABG. No pericardial or pleural effusion.  Small middle mediastinal nodes, none of which meet size criteria for pathologic enlargement no hilar adenopathy.  Limited abdominal imaging demonstrates persistent and likely progressive hepatic steatosis.  Development of moderate cirrhosis. A too small to characterize lesion in the left lobe of the liver measures 6 mm on image 80 of series 6.  Old granulomatous disease in the spleen.  Prominent porta hepatis nodes which are likely reactive.  Example node 1.0 cm image 88.  Remote trauma to the left scapula and bilateral ribs.  IMPRESSION:  1. No evidence of pulmonary embolism. 2.  Mild paraseptal and centrilobular emphysema. 3.  Progression of hepatic steatosis.  Development of moderate cirrhosis.  The liver is incompletely imaged, and a 7 mm low density lesion within is indeterminate.  Consider outpatient follow- up pre and post contrast abdominal MRI. 4.  Prominent upper abdominal nodes, likely reactive and related to the clinical history of hepatitis C  and alcohol addiction  Original Report Authenticated By: Consuello Bossier, M.D.    Medications: Scheduled Meds:    . aspirin  81 mg Oral Daily  . chlordiazePOXIDE  25 mg Oral QID   Followed by  . chlordiazePOXIDE   25 mg Oral TID   Followed by  . chlordiazePOXIDE  25 mg Oral BH-qamhs   Followed by  . chlordiazePOXIDE  25 mg Oral Daily  . ciprofloxacin  500 mg Oral BID  . feeding supplement  237 mL Oral BID BM  . multivitamin with minerals  1 tablet Oral Daily  . pneumococcal 23 valent vaccine  0.5 mL Intramuscular Tomorrow-1000  . thiamine  100 mg Intramuscular Once  . thiamine  100 mg Oral Daily  . DISCONTD: multivitamin with minerals  1 tablet Oral Daily  . DISCONTD: thiamine  100 mg Oral Daily   Continuous Infusions:  PRN Meds:.acetaminophen, acetaminophen, alum & mag hydroxide-simeth, alum & mag hydroxide-simeth, chlordiazePOXIDE, chlordiazePOXIDE, hydrOXYzine, hydrOXYzine, loperamide, loperamide, magnesium hydroxide, magnesium hydroxide, ondansetron, ondansetron  Assessment/Plan:  1. Dizziness: I had a detailed discussion with patient about his symptoms, and he assures me that he has had dizziness whenever he bends over, for several years. He denies gait unsteadiness or falls. As a matter of fact, he has learnt over the years, to bend over with care, to ameliorate this symptom. Certainly, orthostatic BP testing, does not reveal postural hypotension, and hydration status is satisfactory. No further work up is indicated at this time. Patient has borderline low BP today, and has had postural dizziness, which he attributes to the medications he received. Have reviewed patient's medication. He is on no antihypertensives or diuretics, and vistaril appears to be the likely cause of his symptoms, although he may not be tolerating Librium very well. Have discontinued Vistaril, but will defer to primary service, for review of his detox medications. TED stockings may be beneficial at this time. 2. Hemoptysis: Per patient, he had a single episode on 09/16/11, and none since. He denies chest pain or shortness of breath. CXR of 09/17/11, shows emphysematous, bronchitic, and old granulomatous disease changes, and no  acute abnormalities. The significance of a single episode of hemoptysis in this patient with chronic thrombocytopenia is uncertain, but he does have a long-term smoking history, and has experienced some weight loss. Chest CT angiogram showed no evidence of pulmonary embolism, there was mild paraseptal and centrilobular emphysema, otherwise, no other concerning pulmonary findings. Patient has had no recurrence. 3. Alcohol addiction: Patient has a known history of chronic alcohol abuse, and currently drinks at least a bottle of wine a day. He was admitted for detoxification on 09/18/11, and is now on Librium protocol. At present, he has no evidence of ETOH withdrawal phenomena. Will defer management to primary team.  4. Liver disease: Patient has a history of hepatitis C, diagnosed in 04/2009, and as a matter of fact, his HCV RNA quantitative, was 11,1000 on 10/07/09, per EMR. This will account for his bicytopenia and his abnormal LFTs are attributable to multiple factors, including chronic hepatitis C, alcohol abuse, and hepatic steatosis, confirmed on abdominal MRI 05/20/10. CTA of 09/19/11, showed progression of hepatic steatosis. Development of moderate cirrhosis. Unfortunately, patient as had no follow up for hepatitis C, since diagnosis. Perhaps, an appropriate referral can be obtained via his PMD, Dr Juanita Craver, at [facility], on discharge, and should certainly, be factored into his discharge planning.  5. Thrombocytopenia/Bicytopenia: Patient has known chronic thrombocytopenia, which as remained stable. Review of EMR,  reveals that platelet count has ranged at 36,000 to 59,000, from 09/2009 to 09/2010. Platelet count was 51,000 on 09/17/11, and as such, is at baseline, with no overt evidence of bleeding. Patient has a mild leukopenia in addition, and the likely etiology of these abnormalities, is his hepatitis C. HIV test is negative, and platelet count appears close to baseline, at 38,000 today.  6. CAD: Patient appears  asymptomatic, without chest pain or shortness of breath. His primary cardiologist is Dr Armanda Magic, although he has not seen her for several years. He appears clinically stable from this view point.  7. UTI (urinary tract infection): Positive urinary sediment, with significant bacteriuria. He was commenced on ciprofloxacin by primary team, and I concur with this approach. Urine cultures are negative so far.   Comment: Medical team will sign off today.   LOS: 2 days   Breean Nannini,CHRISTOPHER 09/20/2011, 1:33 PM

## 2011-09-20 NOTE — Progress Notes (Signed)
Patient ID: Zachary Steele, male   DOB: [birth year], 61 y.o.   MRN: [id number] Pt. Eyes closed, resp. Even unlabored, no distress noted. Staff will monitor q44min for safety.

## 2011-09-21 MED ORDER — TRAZODONE HCL 50 MG PO TABS
50.0000 mg | ORAL_TABLET | Freq: Every day | ORAL | Status: DC
  Administered 2011-09-21 – 2011-09-23 (×3): 50 mg via ORAL
  Filled 2011-09-21: qty 1
  Filled 2011-09-21: qty 14
  Filled 2011-09-21 (×3): qty 1

## 2011-09-21 NOTE — Progress Notes (Signed)
[facility] Group Notes:  (Counselor/Nursing/MHT/Case Management/Adjunct)  09/21/2011 6:12 PM  Type of Therapy:  Group Therapy  Participation Level:  Did Not Attend   Zachary Steele 09/21/2011, 6:12 PM

## 2011-09-21 NOTE — Progress Notes (Signed)
Patient ID: Zachary Steele, male   DOB: 1950-09-10, 61 y.o.   MRN: [id number] Pt. denies lethality and A/V/H's or other problems.  Took HS  meds at the window and related how one of the providers sometime during this hospitalization, seemed to "get in my face and tell me I can't drink.  I know that and what I want is how to deal with it.  Tell me what my labs are, how my liver is doing and how to handle it-not get angry with me about it!"  Explained to Pt. That the provider was probably just very concerned that given Pt.'s history of drinking, that it was not going to stop.  Pt. was irritable and becoming angry, but calmed down afterward.   No evidence of withdrawals noted and Pt was given a pitcher of water to keep at his bedside and was encouraged to drink more water.

## 2011-09-21 NOTE — Progress Notes (Signed)
[facility] MD Progress Note  09/21/2011 4:04 PM  Diagnosis:   Axis I: Alcohol Abuse and Substance Induced Mood Disorder Axis II: Deferred Axis III:  Past Medical History  Diagnosis Date  . Anemia   . Cataract   . Hepatitis C     Has A and B also  . PAD (peripheral artery disease)     pt reports he has this and has chronic leg "soreness"  . Alcohol dependence    Subjective: Cane reports that he is doing much better today. He denies any withdrawal symptoms or cravings for alcohol. He denies any suicidal or homicidal ideation. He reports that his appetite is good. He did have some trouble sleeping last night. He expresses a strong desire to maintain abstinence from alcohol, and is interested in transferring to the [facility] residential treatment facility after use been detoxed. He then sounds interested in attending 12 step meetings for continued treatment of alcoholism.  ADL's:  Intact  Sleep: Fair  Appetite:  Good  Suicidal Ideation:  Patient denies any thought, plan, or intent Homicidal Ideation:  Patient denies any thought, plan, or intent  AEB (as evidenced by):  Mental Status Examination/Evaluation: Objective:  Appearance: Casual and Fairly Groomed  Eye Contact::  Good  Speech:  Clear and Coherent  Volume:  Normal  Mood:  Dysphoric  Affect:  Congruent  Thought Process:  Circumstantial and Logical  Orientation:  Full  Thought Content:  WDL  Suicidal Thoughts:  No  Homicidal Thoughts:  No  Memory:  Immediate;   Good Recent;   Good Remote;   Good  Judgement:  Fair  Insight:  Lacking  Psychomotor Activity:  Normal  Concentration:  Good  Recall:  Good  Akathisia:  No  Handed:    AIMS (if indicated):     Assets:  Communication Skills Desire for Improvement Financial Resources/Insurance Social Support  Sleep:  Number of Hours: 6.25    Vital Signs:Blood pressure 86/61, pulse 109, temperature 98.4 F (36.9 C), temperature source Oral, resp. rate 18, height 5\' 7"  (1.702  m), weight 57.153 kg (126 lb). Current Medications: Current Facility-Administered Medications  Medication Dose Route Frequency Provider Last Rate Last Dose  . alum & mag hydroxide-simeth (MAALOX/MYLANTA) 200-200-20 MG/5ML suspension 30 mL  30 mL Oral Q4H PRN Jorje Guild, PA-C      . alum & mag hydroxide-simeth (MAALOX/MYLANTA) 200-200-20 MG/5ML suspension 30 mL  30 mL Oral Q4H PRN Verne Spurr, PA-C      . aspirin chewable tablet 81 mg  81 mg Oral Daily Jorje Guild, PA-C   81 mg at 09/21/11 0906  . chlordiazePOXIDE (LIBRIUM) capsule 25 mg  25 mg Oral TID Verne Spurr, PA-C       Followed by  . chlordiazePOXIDE (LIBRIUM) capsule 25 mg  25 mg Oral BH-qamhs Verne Spurr, PA-C       Followed by  . chlordiazePOXIDE (LIBRIUM) capsule 25 mg  25 mg Oral Daily Verne Spurr, PA-C      . ciprofloxacin (CIPRO) tablet 500 mg  500 mg Oral BID Verne Spurr, PA-C   500 mg at 09/21/11 0905  . feeding supplement (ENSURE COMPLETE) liquid 237 mL  237 mL Oral TID BM Jorje Guild, PA-C   237 mL at 09/21/11 0910  . loperamide (IMODIUM) capsule 2-4 mg  2-4 mg Oral PRN Jorje Guild, PA-C      . loperamide (IMODIUM) capsule 2-4 mg  2-4 mg Oral PRN Verne Spurr, PA-C      . magnesium hydroxide (  MILK OF MAGNESIA) suspension 30 mL  30 mL Oral Daily PRN Jorje Guild, PA-C      . magnesium hydroxide (MILK OF MAGNESIA) suspension 30 mL  30 mL Oral Daily PRN Verne Spurr, PA-C      . multivitamin with minerals tablet 1 tablet  1 tablet Oral Daily Verne Spurr, PA-C   1 tablet at 09/21/11 0907  . ondansetron (ZOFRAN-ODT) disintegrating tablet 4 mg  4 mg Oral Q6H PRN Jorje Guild, PA-C      . ondansetron (ZOFRAN-ODT) disintegrating tablet 4 mg  4 mg Oral Q6H PRN Verne Spurr, PA-C      . thiamine (VITAMIN B-1) tablet 100 mg  100 mg Oral Daily Verne Spurr, PA-C   100 mg at 09/21/11 0908  . traZODone (DESYREL) tablet 50 mg  50 mg Oral QHS Jorje Guild, PA-C      . DISCONTD: acetaminophen (TYLENOL) tablet 650 mg  650 mg Oral Q6H PRN Jorje Guild, PA-C   650 mg at 09/20/11 1610  . DISCONTD: acetaminophen (TYLENOL) tablet 650 mg  650 mg Oral Q6H PRN Verne Spurr, PA-C      . DISCONTD: chlordiazePOXIDE (LIBRIUM) capsule 25 mg  25 mg Oral Q6H PRN Jorje Guild, PA-C      . DISCONTD: chlordiazePOXIDE (LIBRIUM) capsule 25 mg  25 mg Oral Q6H PRN Verne Spurr, PA-C      . DISCONTD: feeding supplement (ENSURE COMPLETE) liquid 237 mL  237 mL Oral BID BM Laveda Norman, MD   237 mL at 09/20/11 1545    Lab Results: No results found for this or any previous visit (from the past 48 hour(s)).  Physical Findings: AIMS:  , ,  ,  ,    CIWA:  CIWA-Ar Total: 0  COWS:     Treatment Plan Summary: Daily contact with patient to assess and evaluate symptoms and progress in treatment Medication management  Plan: We will add trazodone at bedtime for sleep. We'll continue his safe medical detox and continue followup planning.  Cornellius Kropp 09/21/2011, 4:04 PM

## 2011-09-21 NOTE — Progress Notes (Addendum)
Psychoeducational Group Note  Date:  09/21/2011 Time:  1515  Group Topic/Focus:  Healthy Communication:   The focus of this group is to discuss communication, barriers to communication, as well as healthy ways to communicate with others. Group discussed positive and negative communication skills, "I feel" statements, and created an "I feel" statement that applied to their crises.    Participation Level:  Active  Participation Quality:  Resistant  Affect:  Appropriate  Cognitive:  Alert and Oriented  Insight:  Limited  Engagement in Group:  Limited  Additional Comments:  Rohn had little insight into how his negative communication skills could have a negative effect on his life. Tanya state d that he told people how he felt even when it would hurt their feeling (e.g. Calling them names, cursing at them etc.)   Wandra Scot 09/21/2011, 6:41 PM

## 2011-09-21 NOTE — Progress Notes (Signed)
Pt has been in bed for most of shift. MHT had to go to room to wake him up for HS meds. Appears bright. Calm and cooperative. No acute distress noted. States he has had a good day. States he was able to talk with MD and be honset about his situation. States he was scared to be D/C'd from here to home as he would most likely relapse. Support and encouragement provided. States he told this to MD who discussed some other options that many be more supportive. States he felt much better going to a 30 day program.  Otherwise offered no questions or concerns. Denies SI/HI/AVH and contracts for safety. POC and medications for the shift reviewed and understanding verbalized. Safety has been maintained with Q15 minute observation. Will f/u response to prn and continue current POC.

## 2011-09-21 NOTE — Progress Notes (Signed)
[facility] Group Notes:  (Counselor/Nursing/MHT/Case Management/Adjunct)  09/21/2011 12:30 PM  Type of Therapy:  Psychoeducational Skills  Participation Level:  Did Not Attend    Summary of Progress/Problems: Zebbie did not attend group spirituality.    Wandra Scot 09/21/2011, 12:30 PM

## 2011-09-21 NOTE — Progress Notes (Signed)
Zachary Steele is seen OOB UAL on the 500 hall today..he remains sad, flat and depressed. He takes his meds as ordered, is drinking his Ensure and is pleasant and cooperative to deal with . Royer completed his self inventory and on it he wrote he denied SI, he rated his depression and hopelessness  " 5 / 1 " and said his DC plan includes " to stay away". He is taking his Ensures, he is going to eat his meals in the cafe' . His affect is sad, flat and he remains depressed. POC remains in place and pt is safe PD RN [facility]

## 2011-09-22 NOTE — Progress Notes (Signed)
Patient up going to DR for meals, interacting w/group and peers in the dayroom, taking meds as ordered by MD, spoke w/treatment team about staying until Wednesday, has complaints at this time, denies Si or Hi. q69min safety checks continue and support offered Safety maintained

## 2011-09-22 NOTE — Tx Team (Signed)
Interdisciplinary Treatment Plan Update (Adult)  Date:  09/22/2011  Time Reviewed:  10:38 AM   Progress in Treatment: Attending groups:   Yes   Participating in groups:  Yes Taking medication as prescribed:  Yes Tolerating medication:  Yes Family/Significant othe contact made: No contact made with family Patient understands diagnosis:  Yes Discussing patient identified problems/goals with staff: Yes Medical problems stabilized or resolved: Yes Denies suicidal/homicidal ideation:Yes Issues/concerns per patient self-inventory:  Other:  New problem(s) identified:  Reason for Continuation of Hospitalization: Depression Medication stabilization  Interventions implemented related to continuation of hospitalization:  Medication Management; safety checks q 15 mins  Additional comments:  Estimated length of stay:   Discharge Plan:  Patient scheduled for an admission assessment at [facility] on 09/24/11  New goal(s):  Review of initial/current patient goals per problem list:   1.  Goal(s): Detox from ETOH  Met:  Yes  Target date: d/c  As evidenced by: Patient completed detox protocol  2.  Goal (s):  Stabilize on medications  Met:  Yes  Target date: d/c  As evidenced by: Patient will report being stable on medications - symptoms have decreased    3.  Goal(s): Reduce depression/anxiety   Met:  Yes  Target date: d/c  As evidenced by: Patient currently rating symptoms at four or below    4.  Goal(s): Refer for residential treatment  Met:  Yes  Target date: d/c  As evidenced by: Patient scheduled for an admission assessment at [facility] on Wednesday, 6/12/113  Attendees: Patient:     Other :  Carney Living 09/22/2011 10:38 AM  Physician:  Orson Aloe, MD 09/22/2011 10:38 AM   Nursing:   Alease Frame 09/22/2011 10:38 AM   CaseManager:  Juline Patch, LCSW 09/22/2011 10:38 AM   Counselor:  Angus Palms, LCSW 09/22/2011 10:38 AM   Other:  Reyes Ivan, LCSWA

## 2011-09-22 NOTE — Progress Notes (Addendum)
[facility] MD Progress Note  09/22/2011 12:21 PM  Diagnosis:  Axis I: Alcohol Abuse and Substance Induced Mood Disorder  ADL's:  Intact  Sleep: Fair  Appetite:  Fair  Suicidal Ideation:  Denies adamantly any suicidal thoughts. Homicidal Ideation:  Denies adamantly any homicidal thoughts.  Mental Status Examination/Evaluation: Objective:  Appearance: Casual  Eye Contact::  Good  Speech:  Clear and Coherent  Volume:  Normal  Mood:  Euthymic  Affect:  Congruent  Thought Process:  Coherent  Orientation:  Full  Thought Content:  WDL  Suicidal Thoughts:  No  Homicidal Thoughts:  No  Memory:  Immediate;   Fair  Judgement:  Fair  Insight:  Fair  Psychomotor Activity:  Normal  Concentration:  Good  Recall:  Good  Akathisia:  No  AIMS (if indicated):     Assets:  Communication Skills Desire for Improvement  Sleep:  Number of Hours: 6.25     Vital Signs:Blood pressure 106/72, pulse 125, temperature 98.4 F (36.9 C), temperature source Oral, resp. rate 18, height 5\' 7"  (1.702 m), weight 57.153 kg (126 lb). Current Medications: Current Facility-Administered Medications  Medication Dose Route Frequency Provider Last Rate Last Dose  . alum & mag hydroxide-simeth (MAALOX/MYLANTA) 200-200-20 MG/5ML suspension 30 mL  30 mL Oral Q4H PRN Jorje Guild, PA-C      . alum & mag hydroxide-simeth (MAALOX/MYLANTA) 200-200-20 MG/5ML suspension 30 mL  30 mL Oral Q4H PRN Verne Spurr, PA-C      . aspirin chewable tablet 81 mg  81 mg Oral Daily Jorje Guild, PA-C   81 mg at 09/22/11 1610  . chlordiazePOXIDE (LIBRIUM) capsule 25 mg  25 mg Oral BH-qamhs Verne Spurr, PA-C   25 mg at 09/21/11 2214   Followed by  . chlordiazePOXIDE (LIBRIUM) capsule 25 mg  25 mg Oral Daily Verne Spurr, PA-C   25 mg at 09/22/11 9604  . feeding supplement (ENSURE COMPLETE) liquid 237 mL  237 mL Oral TID BM Jorje Guild, PA-C   237 mL at 09/21/11 2216  . loperamide (IMODIUM) capsule 2-4 mg  2-4 mg Oral PRN Jorje Guild, PA-C      .  loperamide (IMODIUM) capsule 2-4 mg  2-4 mg Oral PRN Verne Spurr, PA-C      . magnesium hydroxide (MILK OF MAGNESIA) suspension 30 mL  30 mL Oral Daily PRN Jorje Guild, PA-C      . magnesium hydroxide (MILK OF MAGNESIA) suspension 30 mL  30 mL Oral Daily PRN Verne Spurr, PA-C      . multivitamin with minerals tablet 1 tablet  1 tablet Oral Daily Verne Spurr, PA-C   1 tablet at 09/22/11 5409  . ondansetron (ZOFRAN-ODT) disintegrating tablet 4 mg  4 mg Oral Q6H PRN Jorje Guild, PA-C      . ondansetron (ZOFRAN-ODT) disintegrating tablet 4 mg  4 mg Oral Q6H PRN Verne Spurr, PA-C      . thiamine (VITAMIN B-1) tablet 100 mg  100 mg Oral Daily Verne Spurr, PA-C   100 mg at 09/22/11 8119  . traZODone (DESYREL) tablet 50 mg  50 mg Oral QHS Jorje Guild, PA-C   50 mg at 09/21/11 2214    Lab Results:  No results found for this or any previous visit (from the past 48 hour(s)).  Physical Findings: AIMS:  , ,  ,  ,    CIWA:  CIWA-Ar Total: 0  COWS:     Treatment Plan Summary: Daily contact with patient to assess and evaluate symptoms  and progress in treatment Medication management No signs/symptoms of withdrawal and mood/anxiety less than 3/10 where 1 is the best and 10 is the worst  Plan: Pt seen in treatment team where he discussed the wisdom of staying in the hospital until he goes to [facility]. Pt is doing better, but is willing to stay in the hospital until he can transfer directly to [facility] on Wednesday.  Will have him ready to do that by 0650 on Wed.  Jeannine Pennisi 09/22/2011, 12:21 PM

## 2011-09-22 NOTE — Progress Notes (Signed)
Patient resting quietly in bed upon my approach. Patient is up and attentive for group therapy.  Patient denies SI/HI, denies A/V hallucinations. Patient states he has had a "good day." Patient verbalizes he is "looking forward to inpatient rehab, I'm ready to make a change." Patient is insightful and appears committed to his treatment plan. Patient given support and encouragement. Patient remains safe on unit with Q15 minute checks for safety. Will continue to monitor.

## 2011-09-22 NOTE — Progress Notes (Signed)
[facility] Group Notes:  (Counselor/Nursing/MHT/Case Management/Adjunct) 09/22/2011  11:00am Overcoming Obstacles to Wellness   Type of Therapy:  Group Therapy  Participation Level:  Did Not Attend     Billie Lade 09/22/2011   3:46 PM          [facility] Group Notes:  (Counselor/Nursing/MHT/Case Management/Adjunct) 09/22/2011  1:15pm Breathing & Meditation Techniques for Anxiety & Anger   Type of Therapy:  Group Therapy  Participation Level:  Did Not Attend     Billie Lade 09/22/2011   3:46 PM

## 2011-09-22 NOTE — Progress Notes (Signed)
Patient seen during d/c planning group and treatment team.  He denies SI/HI and states he is feeling much better.  Patient shared that he is fearful of discharging at this time as he fears he will relapse if he discharges before Wednesday.  Patient is scheduled for admission to [facility] residential on Wednesday.

## 2011-09-23 MED ORDER — ADULT MULTIVITAMIN W/MINERALS CH: 1.0000 | ORAL_TABLET | Freq: Every day | ORAL | Status: DC

## 2011-09-23 MED ORDER — TRAZODONE HCL 50 MG PO TABS: 50.0000 mg | ORAL_TABLET | Freq: Every day | ORAL | Status: DC

## 2011-09-23 MED ORDER — ASPIRIN 81 MG PO TABS: 81.0000 mg | ORAL_TABLET | Freq: Every day | ORAL | Status: DC

## 2011-09-23 NOTE — Progress Notes (Signed)
Currently resting quietly in bed with eyes closed. Respirations are even and unlabored. No acute distress noted. Safety has been maintained with Q15 minute observation. Will continue current POC. 

## 2011-09-23 NOTE — Progress Notes (Signed)
[facility] MD Progress Note  09/23/2011 3:31 PM  Diagnosis:  Axis I: Alcohol Abuse and Substance Induced Mood Disorder  ADL's:  Intact  Sleep: Good  Appetite:  Good  Suicidal Ideation:  Denies adamantly any suicidal thoughts. Homicidal Ideation:  Denies adamantly any homicidal thoughts.  Mental Status Examination/Evaluation: Objective:  Appearance: Casual  Eye Contact::  Good  Speech:  Clear and Coherent  Volume:  Normal  Mood:  Euthymic  Affect:  Congruent  Thought Process:  Coherent  Orientation:  Full  Thought Content:  WDL  Suicidal Thoughts:  No  Homicidal Thoughts:  No  Memory:  Immediate;   Fair  Judgement:  Fair  Insight:  Fair  Psychomotor Activity:  Normal  Concentration:  Good  Recall:  Good  Akathisia:  No  AIMS (if indicated):     Assets:  Communication Skills Desire for Improvement  Sleep:  Number of Hours: 6.75    ROS: Neuro: no headaches, dizziness, weakness, does note some unsteadiness when he rises and then starts to walk.  Described the neurotoxic effect of alcohol.  Also discussed the use of salt to help bring his BP up.  GI: denies N/V/D/cramps/constipation  MS: TED hose hare helping his legs, otherwise denies weakness, muscle cramps, aches.   Vital Signs:Blood pressure 89/67, pulse 111, temperature 97.3 F (36.3 C), temperature source Oral, resp. rate 18, height 5\' 7"  (1.702 m), weight 57.153 kg (126 lb). Current Medications: Current Facility-Administered Medications  Medication Dose Route Frequency Provider Last Rate Last Dose  . alum & mag hydroxide-simeth (MAALOX/MYLANTA) 200-200-20 MG/5ML suspension 30 mL  30 mL Oral Q4H PRN Jorje Guild, PA-C      . alum & mag hydroxide-simeth (MAALOX/MYLANTA) 200-200-20 MG/5ML suspension 30 mL  30 mL Oral Q4H PRN Verne Spurr, PA-C      . aspirin chewable tablet 81 mg  81 mg Oral Daily Jorje Guild, PA-C   81 mg at 09/23/11 0804  . feeding supplement (ENSURE COMPLETE) liquid 237 mL  237 mL Oral TID BM Jorje Guild,  PA-C   237 mL at 09/23/11 0946  . magnesium hydroxide (MILK OF MAGNESIA) suspension 30 mL  30 mL Oral Daily PRN Verne Spurr, PA-C      . multivitamin with minerals tablet 1 tablet  1 tablet Oral Daily Verne Spurr, PA-C   1 tablet at 09/23/11 0804  . thiamine (VITAMIN B-1) tablet 100 mg  100 mg Oral Daily Verne Spurr, PA-C   100 mg at 09/23/11 0803  . traZODone (DESYREL) tablet 50 mg  50 mg Oral QHS Jorje Guild, PA-C   50 mg at 09/22/11 2206  . DISCONTD: magnesium hydroxide (MILK OF MAGNESIA) suspension 30 mL  30 mL Oral Daily PRN Jorje Guild, PA-C        Lab Results:  No results found for this or any previous visit (from the past 48 hour(s)).  Physical Findings: AIMS:  , ,  ,  ,    CIWA:  CIWA-Ar Total: 0  COWS:     Treatment Plan Summary: Daily contact with patient to assess and evaluate symptoms and progress in treatment Medication management No signs/symptoms of withdrawal and mood/anxiety less than 3/10 where 1 is the best and 10 is the worst  Plan: Pt is doing well will D/C in the AM to [facility].  Zachary Steele 09/23/2011, 3:31 PM

## 2011-09-23 NOTE — Progress Notes (Signed)
[facility] Case Management Discharge Plan:  Will you be returning to the same living situation after discharge: No.  Patient discharging to [facility] At discharge, do you have transportation home?:Yes,  Patient assisted with bus pass to [place] on [address].  [facility] staff will transport patient from that location Do you have the ability to pay for your medications:No. Patient assisted with indigent medicatiosn  Interagency Information:     Release of information consent forms completed and in the chart;  Patient's signature needed at discharge.  Patient to Follow up at:  Follow-up Information    Follow up with [facility]. (You are scheduled for an admission assessment at [facility] on Wednesday, September 24 2011 at [facility])    Contact information:   [address] [place], [place]  [address]  3 [phone]         Patient denies SI/HI:   Yes,  Patient has not endorsed SI    Safety Planning and Suicide Prevention discussed:  Yes,  Reviewed during aftercare groups with all patients.  Barrier to discharge identified:No.  Summary and Recommendations: Patient encourage to be compliant with medications and follow up with outpatient recommedations    Beyza Bellino, Joesph July 09/23/2011, 4:01 PM

## 2011-09-23 NOTE — [facility] Suicide Risk Assessment (Signed)
Suicide Risk Assessment  Discharge Assessment     Demographic factors:  Male    Current Mental Status Per Nursing Assessment::   On Admission:    At Discharge:     Current Mental Status Per Physician:  Loss Factors:    Historical Factors:    Risk Reduction Factors:      Continued Clinical Symptoms:  Severe Anxiety and/or Agitation Depression:   Anhedonia Comorbid alcohol abuse/dependence Alcohol/Substance Abuse/Dependencies Chronic Pain Previous Psychiatric Diagnoses and Treatments  Discharge Diagnoses:   AXIS I:  Alcohol Abuse and Substance Induced Mood Disorder AXIS II:  Deferred AXIS III:   Past Medical History  Diagnosis Date  . Anemia   . Cataract   . Hepatitis C     Has A and B also  . PAD (peripheral artery disease)     pt reports he has this and has chronic leg "soreness"  . Alcohol dependence    AXIS IV:  other psychosocial or environmental problems, problems related to social environment and problems with primary support group AXIS V:  51-60 moderate symptoms  Cognitive Features That Contribute To Risk:  Thought constriction (tunnel vision)    Suicide Risk:  Minimal: No identifiable suicidal ideation.  Patients presenting with no risk factors but with morbid ruminations; may be classified as minimal risk based on the severity of the depressive symptoms  Diagnosis:  Axis I: Alcohol Abuse and Substance Induced Mood Disorder  ADL's:  Intact  Sleep: Good  Appetite:  Good  Suicidal Ideation:  Denies adamantly any suicidal thoughts. Homicidal Ideation:  Denies adamantly any homicidal thoughts.  Mental Status Examination/Evaluation: Objective:  Appearance: Casual  Eye Contact::  Good  Speech:  Clear and Coherent  Volume:  Normal  Mood:  Euthymic  Affect:  Congruent  Thought Process:  Coherent  Orientation:  Full  Thought Content:  WDL  Suicidal Thoughts:  No  Homicidal Thoughts:  No  Memory:  Immediate;   Fair  Judgement:  Fair    Insight:  Fair  Psychomotor Activity:  Normal  Concentration:  Good  Recall:  Good  Akathisia:  No  AIMS (if indicated):     Assets:  Communication Skills Desire for Improvement  Sleep:  Number of Hours: 6.75    ROS: Neuro: no headaches, dizziness, weakness, does note some unsteadiness when he rises and then starts to walk.  Described the neurotoxic effect of alcohol.  Also discussed the use of salt to help bring his BP up.  GI: denies N/V/D/cramps/constipation  MS: TED hose hare helping his legs, otherwise denies weakness, muscle cramps, aches.   Vital Signs:Blood pressure 89/67, pulse 111, temperature 97.3 F (36.3 C), temperature source Oral, resp. rate 18, height 5\' 7"  (1.702 m), weight 57.153 kg (126 lb). Current Medications: Current Facility-Administered Medications  Medication Dose Route Frequency Provider Last Rate Last Dose  . alum & mag hydroxide-simeth (MAALOX/MYLANTA) 200-200-20 MG/5ML suspension 30 mL  30 mL Oral Q4H PRN Jorje Guild, PA-C      . alum & mag hydroxide-simeth (MAALOX/MYLANTA) 200-200-20 MG/5ML suspension 30 mL  30 mL Oral Q4H PRN Verne Spurr, PA-C      . aspirin chewable tablet 81 mg  81 mg Oral Daily Jorje Guild, PA-C   81 mg at 09/23/11 0804  . feeding supplement (ENSURE COMPLETE) liquid 237 mL  237 mL Oral TID BM Jorje Guild, PA-C   237 mL at 09/23/11 0946  . magnesium hydroxide (MILK OF MAGNESIA) suspension 30 mL  30 mL Oral Daily  PRN Verne Spurr, PA-C      . multivitamin with minerals tablet 1 tablet  1 tablet Oral Daily Verne Spurr, PA-C   1 tablet at 09/23/11 0804  . thiamine (VITAMIN B-1) tablet 100 mg  100 mg Oral Daily Verne Spurr, PA-C   100 mg at 09/23/11 0803  . traZODone (DESYREL) tablet 50 mg  50 mg Oral QHS Jorje Guild, PA-C   50 mg at 09/22/11 2206  . DISCONTD: magnesium hydroxide (MILK OF MAGNESIA) suspension 30 mL  30 mL Oral Daily PRN Jorje Guild, PA-C        Lab Results:  No results found for this or any previous visit (from the past  72 hour(s)).  RISK REDUCTION FACTORS: What pt has learned from hospital stay is that for real he has to stop drinking.  Risk of self harm is elevated by his mood and his addictions, but he now realizes that he has his family, his grand kids, and himself to live for.  Risk of harm to others is minimal in that he has not been involved in fights or had any legal charges filed on him.  Pt seen in treatment team where he divulged the above information. The treatment team concluded that he was ready for discharge and had met his goals for an inpatient setting.  PLAN: Discharge home Continue Medication List  As of 09/23/2011  3:40 PM   STOP taking these medications         milk thistle 175 MG tablet         TAKE these medications      Indication    aspirin 81 MG tablet   Take 1 tablet (81 mg total) by mouth daily. For: Blood thinner for heart health       multivitamin with minerals Tabs   Take 1 tablet by mouth daily. For nutritional vitamin supplement       traZODone 50 MG tablet   Commonly known as: DESYREL   Take 1 tablet (50 mg total) by mouth at bedtime. For sleep            Follow-up recommendations:  Activities: Resume typical activities Diet: Resume typical diet Other: Follow up with outpatient provider and report any side effects to out patient prescriber.  Plan: Pt is doing well will D/C in the AM to [facility].   Lachina Salsberry 09/23/2011, 3:37 PM

## 2011-09-23 NOTE — Progress Notes (Signed)
Patient is rating depression and hopelessness at a 1/10.  Denies SI/HI at this time.  Positive about being admitted to [facility] tomorrow.  Is up on the unit interacting with staff and other patients, but is not attending groups regularly.  Safety maintained on unit.

## 2011-09-23 NOTE — Progress Notes (Signed)
Pt is ready to go in the morning. Pt would like to be awakened at 6am in the morning so he can get ready to leave by 7am. All of pt stuff is ready all that needs to be done is his paperwork. Pt was offered support and encouragement. Pt did not attend groups today. Pt safety maintained on the unit.

## 2011-09-23 NOTE — Discharge Instructions (Signed)
Attend 90 meetings in 90 days. Get trusted sponsor from the advise of others or from whomever in meetings seems to make sense, has a proven track record, will hold you responsible for your sobriety, and both expects and insists on total abstinence.  Work the steps HONESTLY with the trusted sponsor. Get obsessed with your recovery by often reminding yourself of how DEADLY this dredged horrible disease of addiction JUST IS. Focus the first month on speaker meetings where you specifically look at how your life has been wrecked by drugs/alcohol and how your life has been similar to that of the speakers.   

## 2011-09-23 NOTE — Progress Notes (Signed)
[facility] Group Notes:  (Counselor/Nursing/MHT/Case Management/Adjunct) 09/23/2011  11:00am Feelings About Diagnosis   Type of Therapy:  Group Therapy  Participation Level:  Did Not Attend     Billie Lade 09/23/2011   12:27 PM

## 2011-09-24 NOTE — Progress Notes (Signed)
Writer received call from patient's daughter advising patient not accepted at [facility].  Writer was unable to talk with patient as no consent on file to speak with her.  Daughter was informed could not confirm or deny admission but was asked to have person call who may have been in hospital.  Daughter very beligerent and Clinical research associate hung up.  Daughter called back again very beligerent and put patient on phone.  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  Writer asked for phone number to call patient back as Clinical research associate would follow up with Roe Coombs at [facility].  Patient's policy pays a flat $3,000 for inpatient services.  Writer contacted [facility] without providing patient's name and they had openings and can work with patient's limited Brewing technologist.  Writer awaiting patient to call back.

## 2011-09-24 NOTE — Discharge Summary (Signed)
I agree with this D/C Summary.  

## 2011-09-24 NOTE — Discharge Summary (Signed)
Physician Discharge Summary Note  Patient:  Zachary Steele is an 61 y.o., male MRN:  [id number] DOB:  1950-07-30 Patient phone:  [phone] (home)  Patient address:   [address] [place] [address],   Date of Admission:  09/18/2011 Date of Discharge: 09/23/11  Reason for Admission: Alcohol detox  Discharge Diagnoses: Principal Problem:  *Alcohol addiction Active Problems:  Abnormal liver enzymes  Abnormal platelets  Pancytopenia  UTI (urinary tract infection)  Substance induced mood disorder  Axis I: Alcohol addiction/abuse, Substance induced mood disorder.  Axis II: Deferred.  AXIS III:   Past Medical History  Diagnosis Date  . Anemia   . Cataract   . Hepatitis C     Has A and B also  . PAD (peripheral artery disease)     pt reports he has this and has chronic leg "soreness"  . Alcohol dependence    AXIS IV:  other psychosocial or environmental problems and Substance abuse and dependency. AXIS V:  67  Level of Care:  [facility]  Hospital Course:  This is a first time admission for detox, for this 61 yr. Old Married AA male. He has a history of drinking 1 1/2 -2 bottles of wine a day for "years." He states he has been unable to eat lately and the weight loss is getting worrisome for him. He says his appetite is poor and he just doesn't eat unless it's "liquid." He has never been admitted for detox. He does note that he is sleeping better since being admitted to the hospital. His appetite is improving. He rates his depression 3/10, he denies SI/HI, reports no AH/VH. He does report his anxiety is a 9/10, and rates his feelings of hopelessness at 5/10.  While a patient in this hospital, Zachary Steele received medication management to combat alcohol withdrawal symptoms. He received Librium protocol for alcohol detoxification, Trazodone 50 mg for sleep and Asprin 81 mg for heart health. He was also enrolled in group counseling and activities of which he participated  actively. He also participated in AA/NA meetings held in this unit for his substance abuse issues. Zachary Steele tolerated his treatment regimen without any significant adverse effects and or reactions.   Patient attended treatment team meeting this am and met with his treatment team members. Patient's symptoms, response to treatment regimen and treatment plan discussed with patient. Patient endorsed that he is stable for a discharge to a residential treatment facility. He will continue substance abuse treatment at the [facility] in [place], [place]. Patient received 2 weeks worth samples of his [facility] discharge medications. He was provided with a bus pass to [place] on [address] where he will be picked up by a staff from the [facility].    Upon discharge, patient adamantly denies suicidal, homicidal ideations, auditory, visual hallucinations and or delusional thinking. Patient left [facility] with all personal belongings in no apparent distress.   Consults:  None  Significant Diagnostic Studies:  labs: CBC with diff, CMP, Toxicology  Discharge Vitals:   Blood pressure 92/68, pulse 99, temperature 97 F (36.1 C), temperature source Oral, resp. rate 18, height 5\' 7"  (1.702 m), weight 57.153 kg (126 lb).  Mental Status Exam: See Mental Status Examination and Suicide Risk Assessment completed by Attending Physician prior to discharge.  Discharge destination:  [facility]  Is patient on multiple antipsychotic therapies at discharge:  No   Has Patient had three or more failed trials of antipsychotic monotherapy by history:  No  Recommended Plan for Multiple Antipsychotic Therapies: NA   Medication List  As of 09/24/2011  9:27 AM   STOP taking these medications         milk thistle 175 MG tablet         TAKE these medications      Indication    aspirin 81 MG tablet   Take 1 tablet (81 mg total) by mouth daily. For: Blood thinner for heart health        multivitamin with minerals Tabs   Take 1 tablet by mouth daily. For nutritional vitamin supplement       traZODone 50 MG tablet   Commonly known as: DESYREL   Take 1 tablet (50 mg total) by mouth at bedtime. For sleep            Follow-up Information    Follow up with [facility]. (You are scheduled for an admission assessment at [facility] on Wednesday, September 24 2011 at [facility])    Contact information:   [address] [place], [place]  [address]  [phone]         Follow-up recommendations:  Activity:  as tolerated Other:  Keep all follow-up appointemnt as recommended.  Comments:  Take all your medicines as ordered. Report to your out patient provider any adverse effects from your medications. Patient is instructed and cautioned to abstain from alcohol and or other illegal drug use while on prescription medicines. In the event of worsening symptoms, patient is instructed to call the crisis hotline, 911 and or go to the nearest ED.  SignedArmandina Stammer I 09/24/2011, 9:27 AM

## 2011-09-24 NOTE — Discharge Planning (Signed)
Pt was discharged to lobby at 0650 in no acute distress. Discharge instructions, medication samples and prescriptions were provided. Understanding was verbalized. Consent for medical information in chart. Pt denies SI/HI/AVh and is motivated to continue his treatment with [facility].

## 2011-09-26 NOTE — Progress Notes (Signed)
Patient Discharge Instructions:  After Visit Summary (AVS):   Faxed to:  09/25/2012 Psychiatric Admission Assessment Note:   Faxed to:  09/25/2012 Suicide Risk Assessment - Discharge Assessment:   Faxed to:  09/25/2012 Faxed/Sent to the Next Level Care provider:  09/25/2012  Information faxed to: [facility] @ [phone]  Karleen Hampshire Brittini, 09/26/2011, 4:45 PM

## 2012-02-19 ENCOUNTER — Encounter (HOSPITAL_COMMUNITY): Payer: Self-pay | Admitting: Emergency Medicine

## 2012-02-19 ENCOUNTER — Emergency Department (HOSPITAL_COMMUNITY)
Admission: EM | Admit: 2012-02-19 | Discharge: 2012-02-19 | Disposition: A | Discharge status: Home / Self Care | Payer: 59 | Attending: Emergency Medicine | Admitting: Emergency Medicine

## 2012-02-19 DIAGNOSIS — I739 Peripheral vascular disease, unspecified: Secondary | ICD-10-CM | POA: Insufficient documentation

## 2012-02-19 DIAGNOSIS — Z8669 Personal history of other diseases of the nervous system and sense organs: Secondary | ICD-10-CM | POA: Insufficient documentation

## 2012-02-19 DIAGNOSIS — B1920 Unspecified viral hepatitis C without hepatic coma: Secondary | ICD-10-CM | POA: Insufficient documentation

## 2012-02-19 DIAGNOSIS — Z7982 Long term (current) use of aspirin: Secondary | ICD-10-CM | POA: Insufficient documentation

## 2012-02-19 DIAGNOSIS — Z862 Personal history of diseases of the blood and blood-forming organs and certain disorders involving the immune mechanism: Secondary | ICD-10-CM | POA: Insufficient documentation

## 2012-02-19 DIAGNOSIS — F17200 Nicotine dependence, unspecified, uncomplicated: Secondary | ICD-10-CM | POA: Insufficient documentation

## 2012-02-19 DIAGNOSIS — K7460 Unspecified cirrhosis of liver: Secondary | ICD-10-CM | POA: Insufficient documentation

## 2012-02-19 DIAGNOSIS — F10929 Alcohol use, unspecified with intoxication, unspecified: Secondary | ICD-10-CM

## 2012-02-19 DIAGNOSIS — F1010 Alcohol abuse, uncomplicated: Secondary | ICD-10-CM | POA: Insufficient documentation

## 2012-02-19 HISTORY — DX: Unspecified cirrhosis of liver: K74.60

## 2012-02-19 LAB — COMPREHENSIVE METABOLIC PANEL
ALT: 40 U/L (ref 0–53)
AST: 83 U/L — ABNORMAL HIGH (ref 0–37)
Albumin: 3.6 g/dL (ref 3.5–5.2)
Alkaline Phosphatase: 129 U/L — ABNORMAL HIGH (ref 39–117)
BUN: 4 mg/dL — ABNORMAL LOW (ref 6–23)
CO2: 25 mEq/L (ref 19–32)
Calcium: 8.9 mg/dL (ref 8.4–10.5)
Chloride: 103 mEq/L (ref 96–112)
Creatinine, Ser: 0.68 mg/dL (ref 0.50–1.35)
GFR calc Af Amer: >90 mL/min (ref >90)
GFR calc non Af Amer: >90 mL/min (ref >90)
Glucose, Bld: 122 mg/dL — ABNORMAL HIGH (ref 70–99)
Potassium: 3.7 mEq/L (ref 3.5–5.1)
Sodium: 142 mEq/L (ref 135–145)
Total Bilirubin: 0.5 mg/dL (ref 0.3–1.2)
Total Protein: 8.1 g/dL (ref 6.0–8.3)

## 2012-02-19 LAB — CBC WITH DIFFERENTIAL/PLATELET
Basophils Absolute: 0 10*3/uL (ref 0.0–0.1)
Basophils Relative: 1 % (ref 0–1)
Eosinophils Absolute: 0.1 10*3/uL (ref 0.0–0.7)
Eosinophils Relative: 2 % (ref 0–5)
HCT: 40.3 % (ref 39.0–52.0)
Hemoglobin: 14.5 g/dL (ref 13.0–17.0)
Lymphocytes Relative: 62 % — ABNORMAL HIGH (ref 12–46)
Lymphs Abs: 3.1 10*3/uL (ref 0.7–4.0)
MCH: 34.4 pg — ABNORMAL HIGH (ref 26.0–34.0)
MCHC: 36 g/dL (ref 30.0–36.0)
MCV: 95.5 fL (ref 78.0–100.0)
Monocytes Absolute: 0.4 10*3/uL (ref 0.1–1.0)
Monocytes Relative: 7 % (ref 3–12)
Neutro Abs: 1.4 10*3/uL — ABNORMAL LOW (ref 1.7–7.7)
Neutrophils Relative %: 28 % — ABNORMAL LOW (ref 43–77)
Platelets: 82 10*3/uL — ABNORMAL LOW (ref 150–400)
RBC: 4.22 MIL/uL (ref 4.22–5.81)
RDW: 14.4 % (ref 11.5–15.5)
WBC: 5 10*3/uL (ref 4.0–10.5)

## 2012-02-19 LAB — URINALYSIS, ROUTINE W REFLEX MICROSCOPIC
Bilirubin Urine: NEGATIVE
Glucose, UA: NEGATIVE mg/dL
Hgb urine dipstick: NEGATIVE
Ketones, ur: NEGATIVE mg/dL
Leukocytes, UA: NEGATIVE
Nitrite: NEGATIVE
Protein, ur: NEGATIVE mg/dL
Specific Gravity, Urine: 1.002 — ABNORMAL LOW (ref 1.005–1.030)
Urobilinogen, UA: 1 mg/dL (ref 0.0–1.0)
pH: 6 (ref 5.0–8.0)

## 2012-02-19 LAB — RAPID URINE DRUG SCREEN, HOSP PERFORMED
Amphetamines: NOT DETECTED
Barbiturates: NOT DETECTED
Benzodiazepines: NOT DETECTED
Cocaine: NOT DETECTED
Opiates: NOT DETECTED
Tetrahydrocannabinol: NOT DETECTED

## 2012-02-19 LAB — ETHANOL: Alcohol, Ethyl (B): 302 mg/dL — ABNORMAL HIGH (ref 0–11)

## 2012-02-19 MED ORDER — GI COCKTAIL ~~LOC~~
30.0000 mL | Freq: Once | ORAL | Status: DC
  Filled 2012-02-19: qty 30

## 2012-02-19 MED ORDER — ZOLPIDEM TARTRATE 5 MG PO TABS: 5.0000 mg | ORAL_TABLET | Freq: Every evening | ORAL | Status: DC | PRN

## 2012-02-19 MED ORDER — FAMOTIDINE 20 MG PO TABS
20.0000 mg | ORAL_TABLET | Freq: Once | ORAL | Status: AC
  Administered 2012-02-19: 20 mg via ORAL

## 2012-02-19 MED ORDER — FAMOTIDINE 20 MG PO TABS
20.0000 mg | ORAL_TABLET | Freq: Once | ORAL | Status: DC
  Filled 2012-02-19: qty 1

## 2012-02-19 MED ORDER — FAMOTIDINE 20 MG PO TABS
20.0000 mg | ORAL_TABLET | Freq: Once | ORAL | Status: AC
  Administered 2012-02-19: 20 mg via ORAL
  Filled 2012-02-19: qty 1

## 2012-02-19 MED ORDER — ALUM & MAG HYDROXIDE-SIMETH 200-200-20 MG/5ML PO SUSP: 30.0000 mL | ORAL | Status: DC | PRN

## 2012-02-19 MED ORDER — ONDANSETRON HCL 8 MG PO TABS: 4.0000 mg | ORAL_TABLET | Freq: Three times a day (TID) | ORAL | Status: DC | PRN

## 2012-02-19 MED ORDER — GI COCKTAIL ~~LOC~~
30.0000 mL | Freq: Once | ORAL | Status: AC
  Administered 2012-02-19: 30 mL via ORAL

## 2012-02-19 MED ORDER — ACETAMINOPHEN 325 MG PO TABS: 650.0000 mg | ORAL_TABLET | ORAL | Status: DC | PRN

## 2012-02-19 MED ORDER — IBUPROFEN 200 MG PO TABS: 600.0000 mg | ORAL_TABLET | Freq: Three times a day (TID) | ORAL | Status: DC | PRN

## 2012-02-19 MED ORDER — NICOTINE 21 MG/24HR TD PT24: 21.0000 mg | MEDICATED_PATCH | Freq: Every day | TRANSDERMAL | Status: DC | PRN

## 2012-02-19 NOTE — ED Provider Notes (Signed)
History     CSN: [id number]  Arrival date & time 02/19/12  1015   First MD Initiated Contact with Patient 02/19/12 1151      Chief Complaint  Patient presents with  . Abdominal Pain    (Consider location/radiation/quality/duration/timing/severity/associated sxs/prior treatment) HPI Comments: Zachary Steele is a 61 y.o. Male who is here to be evaluated for alcohol abuse. He started drinking 2 weeks ago. He feels like he can't control his drinking. Prior to that, he had been sober for 3 weeks. He, states that he started drinking after arguing with his wife. He also has upper abdominal pain. It started several days ago during a drinking binge. He denies nausea, vomiting, diarrhea, fever, chills, weakness, or dizziness. He came here by private vehicle. He is not sure if he needs to be detoxified. There are no modifying factors. His drinking has been compromizing his life.  Patient is a 61 y.o. male presenting with abdominal pain. The history is provided by the patient.  Abdominal Pain The primary symptoms of the illness include abdominal pain.    Past Medical History  Diagnosis Date  . Anemia   . Cataract   . Hepatitis C     Has A and B also  . PAD (peripheral artery disease)     pt reports he has this and has chronic leg "soreness"  . Alcohol dependence   . Cirrhosis     Past Surgical History  Procedure Date  . Coronary artery bypass graft 2010    Triple bypass at [facility]    Family History  Problem Relation Age of Onset  . Stroke Mother     History  Substance Use Topics  . Smoking status: Current Some Day Smoker -- 0.2 packs/day for 49 years  . Smokeless tobacco: Never Used     Comment: Is trying to stop smoking, still has one or two per day most days  . Alcohol Use: 2.0 oz/week    4 drink(s) per week      Review of Systems  Gastrointestinal: Positive for abdominal pain.  All other systems reviewed and are negative.    Allergies  Review of patient's  allergies indicates no known allergies.  Home Medications   Current Outpatient Rx  Name  Route  Sig  Dispense  Refill  . ASPIRIN 81 MG PO TABS   Oral   Take 1 tablet (81 mg total) by mouth daily. For: Blood thinner for heart health   30 tablet      . ADULT MULTIVITAMIN W/MINERALS CH   Oral   Take 1 tablet by mouth daily. For nutritional vitamin supplement           BP 144/89  Pulse 71  Temp 98.2 F (36.8 C)  Resp 18  SpO2 95%  Physical Exam  Nursing note and vitals reviewed. Constitutional: He is oriented to person, place, and time. He appears well-developed and well-nourished.  HENT:  Head: Normocephalic and atraumatic.  Right Ear: External ear normal.  Left Ear: External ear normal.  Eyes: Conjunctivae normal and EOM are normal. Pupils are equal, round, and reactive to light.  Neck: Normal range of motion and phonation normal. Neck supple.  Cardiovascular: Normal rate, regular rhythm, normal heart sounds and intact distal pulses.   Pulmonary/Chest: Effort normal and breath sounds normal. He exhibits no bony tenderness.  Abdominal: Soft. Normal appearance. There is no tenderness (Mild epigastric discomfort with palpation).  Musculoskeletal: Normal range of motion.  Neurological: He is alert  and oriented to person, place, and time. He has normal strength. No cranial nerve deficit or sensory deficit. He exhibits normal muscle tone. Coordination normal.       Good memory. Mildly slurred speech, no ataxia  Skin: Skin is warm, dry and intact.  Psychiatric: His behavior is normal. Judgment and thought content normal.       Anxious    ED Course  Procedures (including critical care time)  Labs Reviewed  CBC WITH DIFFERENTIAL - Abnormal; Notable for the following:    MCH 34.4 (*)     Platelets 82 (*)  PLATELET COUNT CONFIRMED BY SMEAR   Neutrophils Relative 28 (*)     Neutro Abs 1.4 (*)     Lymphocytes Relative 62 (*)     All other components within normal limits    COMPREHENSIVE METABOLIC PANEL - Abnormal; Notable for the following:    Glucose, Bld 122 (*)     BUN 4 (*)     AST 83 (*)     Alkaline Phosphatase 129 (*)     All other components within normal limits  URINALYSIS, ROUTINE W REFLEX MICROSCOPIC - Abnormal; Notable for the following:    Specific Gravity, Urine 1.002 (*)     All other components within normal limits  ETHANOL - Abnormal; Notable for the following:    Alcohol, Ethyl (B) 302 (*)     All other components within normal limits  URINE RAPID DRUG SCREEN (HOSP PERFORMED)  URINE RAPID DRUG SCREEN (HOSP PERFORMED)   No results found.   1. Alcohol intoxication       MDM  Alcohol intoxication, with short term use of alcohol. I doubt that he is at risk for DTs. He will be observed until sober, and then likely discharged, with a recommendation for outpatient followup        Flint Melter, MD 02/19/12 1658

## 2012-02-19 NOTE — ED Notes (Signed)
Pt c/o increased LLQ pain x several weeks since started to drink ETOH again; pt sts urgency for urination

## 2012-02-19 NOTE — ED Notes (Signed)
Pt ambulated in hall without difficulty.

## 2012-02-19 NOTE — ED Notes (Signed)
Patient unsure if he wants help for drinking.  Wants relief of abdominal pain.

## 2012-02-19 NOTE — ED Provider Notes (Signed)
Patient with cough intoxication transferred to pod C. He has been sleeping comfortably.  Patient heart shows regular rate and rhythm with no murmurs rubs or gallops, lung sounds are clear to auscultation bilaterally. Abdominal exam is benign with no tenderness to palpation or or peritoneal signs.  He should ambulate with a steady gait. Vital signs are stable and he is tolerating by mouth. Patient is requesting discharge. I will discharge him with a resource guide for detoxification he has primary care followup I will ask him to followup in the next 24-48 hours.  Wynetta Emery, PA-C 02/19/12 1937

## 2012-02-25 NOTE — ED Provider Notes (Signed)
Medical screening examination/treatment/procedure(s) were performed by non-physician practitioner and as supervising physician I was immediately available for consultation/collaboration.  Flint Melter, MD 02/25/12 [phone]

## 2012-05-25 ENCOUNTER — Encounter (HOSPITAL_COMMUNITY): Payer: Self-pay | Admitting: *Deleted

## 2012-05-25 ENCOUNTER — Emergency Department (HOSPITAL_COMMUNITY): Payer: No Typology Code available for payment source

## 2012-05-25 ENCOUNTER — Emergency Department (HOSPITAL_COMMUNITY)
Admission: EM | Admit: 2012-05-25 | Discharge: 2012-05-25 | Disposition: A | Discharge status: Home / Self Care | Payer: No Typology Code available for payment source | Attending: Emergency Medicine | Admitting: Emergency Medicine

## 2012-05-25 DIAGNOSIS — K7460 Unspecified cirrhosis of liver: Secondary | ICD-10-CM | POA: Insufficient documentation

## 2012-05-25 DIAGNOSIS — F1020 Alcohol dependence, uncomplicated: Secondary | ICD-10-CM | POA: Insufficient documentation

## 2012-05-25 DIAGNOSIS — Z862 Personal history of diseases of the blood and blood-forming organs and certain disorders involving the immune mechanism: Secondary | ICD-10-CM | POA: Insufficient documentation

## 2012-05-25 DIAGNOSIS — R748 Abnormal levels of other serum enzymes: Secondary | ICD-10-CM | POA: Insufficient documentation

## 2012-05-25 DIAGNOSIS — F17200 Nicotine dependence, unspecified, uncomplicated: Secondary | ICD-10-CM | POA: Insufficient documentation

## 2012-05-25 DIAGNOSIS — I2510 Atherosclerotic heart disease of native coronary artery without angina pectoris: Secondary | ICD-10-CM | POA: Insufficient documentation

## 2012-05-25 DIAGNOSIS — I739 Peripheral vascular disease, unspecified: Secondary | ICD-10-CM | POA: Insufficient documentation

## 2012-05-25 DIAGNOSIS — Z8619 Personal history of other infectious and parasitic diseases: Secondary | ICD-10-CM | POA: Insufficient documentation

## 2012-05-25 DIAGNOSIS — H269 Unspecified cataract: Secondary | ICD-10-CM | POA: Insufficient documentation

## 2012-05-25 DIAGNOSIS — Z7982 Long term (current) use of aspirin: Secondary | ICD-10-CM | POA: Insufficient documentation

## 2012-05-25 LAB — URINALYSIS, ROUTINE W REFLEX MICROSCOPIC
Bilirubin Urine: NEGATIVE
Glucose, UA: NEGATIVE mg/dL
Hgb urine dipstick: NEGATIVE
Ketones, ur: NEGATIVE mg/dL
Leukocytes, UA: NEGATIVE
Nitrite: NEGATIVE
Protein, ur: NEGATIVE mg/dL
Specific Gravity, Urine: 1.007 (ref 1.005–1.030)
Urobilinogen, UA: 0.2 mg/dL (ref 0.0–1.0)
pH: 5.5 (ref 5.0–8.0)

## 2012-05-25 LAB — RAPID URINE DRUG SCREEN, HOSP PERFORMED
Amphetamines: NOT DETECTED
Barbiturates: NOT DETECTED
Benzodiazepines: NOT DETECTED
Cocaine: NOT DETECTED
Opiates: NOT DETECTED
Tetrahydrocannabinol: POSITIVE — AB

## 2012-05-25 LAB — COMPREHENSIVE METABOLIC PANEL
ALT: 56 U/L — ABNORMAL HIGH (ref 0–53)
AST: 115 U/L — ABNORMAL HIGH (ref 0–37)
Albumin: 4.1 g/dL (ref 3.5–5.2)
Alkaline Phosphatase: 105 U/L (ref 39–117)
BUN: 4 mg/dL — ABNORMAL LOW (ref 6–23)
CO2: 20 mEq/L (ref 19–32)
Calcium: 9.3 mg/dL (ref 8.4–10.5)
Chloride: 105 mEq/L (ref 96–112)
Creatinine, Ser: 0.69 mg/dL (ref 0.50–1.35)
GFR calc Af Amer: >90 mL/min (ref >90)
GFR calc non Af Amer: >90 mL/min (ref >90)
Glucose, Bld: 86 mg/dL (ref 70–99)
Potassium: 3.7 mEq/L (ref 3.5–5.1)
Sodium: 141 mEq/L (ref 135–145)
Total Bilirubin: 0.5 mg/dL (ref 0.3–1.2)
Total Protein: 8.5 g/dL — ABNORMAL HIGH (ref 6.0–8.3)

## 2012-05-25 LAB — CBC WITH DIFFERENTIAL/PLATELET
Basophils Absolute: 0 10*3/uL (ref 0.0–0.1)
Basophils Relative: 1 % (ref 0–1)
Eosinophils Absolute: 0.1 10*3/uL (ref 0.0–0.7)
Eosinophils Relative: 2 % (ref 0–5)
HCT: 40.7 % (ref 39.0–52.0)
Hemoglobin: 14.6 g/dL (ref 13.0–17.0)
Lymphocytes Relative: 44 % (ref 12–46)
Lymphs Abs: 2.4 10*3/uL (ref 0.7–4.0)
MCH: 35.4 pg — ABNORMAL HIGH (ref 26.0–34.0)
MCHC: 35.9 g/dL (ref 30.0–36.0)
MCV: 98.8 fL (ref 78.0–100.0)
Monocytes Absolute: 0.6 10*3/uL (ref 0.1–1.0)
Monocytes Relative: 12 % (ref 3–12)
Neutro Abs: 2.2 10*3/uL (ref 1.7–7.7)
Neutrophils Relative %: 41 % — ABNORMAL LOW (ref 43–77)
Platelets: 75 10*3/uL — ABNORMAL LOW (ref 150–400)
RBC: 4.12 MIL/uL — ABNORMAL LOW (ref 4.22–5.81)
RDW: 14 % (ref 11.5–15.5)
WBC: 5.3 10*3/uL (ref 4.0–10.5)

## 2012-05-25 LAB — ETHANOL: Alcohol, Ethyl (B): 175 mg/dL — ABNORMAL HIGH (ref 0–11)

## 2012-05-25 MED ORDER — SODIUM CHLORIDE 0.9 % IV BOLUS (SEPSIS)
1000.0000 mL | Freq: Once | INTRAVENOUS | Status: AC
  Administered 2012-05-25: 1000 mL via INTRAVENOUS

## 2012-05-25 NOTE — ED Notes (Signed)
Pt complaining of episodes of both hands going numb x 2 months.  Pt advises he has been drinking wine in excess for 2 months also.  He drinks 1 to 2 bottles daily and blames it on stress.

## 2012-05-25 NOTE — ED Provider Notes (Signed)
History     CSN: [id number]  Arrival date & time 05/25/12  0821   First MD Initiated Contact with Patient 05/25/12 [phone]      Chief Complaint  Patient presents with  . Hand Problem    (Consider location/radiation/quality/duration/timing/severity/associated sxs/prior treatment) Patient is a 62 y.o. male presenting with neurologic complaint. The history is provided by the patient.  Neurologic Problem The primary symptoms include paresthesias. Primary symptoms do not include headaches, fever, nausea or vomiting. The symptoms began more than 1 week ago. The symptoms are waxing and waning. The neurological symptoms are multifocal. The symptoms occurred after drinking alcohol.  Paresthesias began greater than 24 hours ago. The paresthesias are waxing and waning. The paresthesias are described as tingling. Affected locations include the: left hand and left foot.  Additional symptoms do not include neck stiffness, weakness, photophobia, vertigo or anxiety. Associated medical issues comments: CAD s/p CABG.    Past Medical History  Diagnosis Date  . Anemia   . Cataract   . Hepatitis C     Has A and B also  . PAD (peripheral artery disease)     pt reports he has this and has chronic leg "soreness"  . Alcohol dependence   . Cirrhosis     Past Surgical History  Procedure Laterality Date  . Coronary artery bypass graft  2010    Triple bypass at [facility]    Family History  Problem Relation Age of Onset  . Stroke Mother     History  Substance Use Topics  . Smoking status: Current Some Day Smoker -- 0.20 packs/day for 49 years  . Smokeless tobacco: Never Used     Comment: Is trying to stop smoking, still has one or two per day most days  . Alcohol Use: 2.0 oz/week    4 drink(s) per week     Comment: pt drinks 1-2 bottles of wine daily      Review of Systems  Constitutional: Negative for fever and fatigue.  HENT: Negative for congestion, rhinorrhea, neck stiffness and postnasal  drip.   Eyes: Negative for photophobia and visual disturbance.  Respiratory: Negative for chest tightness, shortness of breath and wheezing.   Cardiovascular: Negative for chest pain, palpitations and leg swelling.  Gastrointestinal: Negative for nausea, vomiting, abdominal pain and diarrhea.  Genitourinary: Negative for urgency, frequency and difficulty urinating.  Musculoskeletal: Negative for back pain and arthralgias.  Skin: Negative for rash and wound.  Neurological: Positive for speech difficulty, numbness and paresthesias. Negative for vertigo, weakness and headaches.  Psychiatric/Behavioral: Negative for confusion and agitation.    Allergies  Review of patient's allergies indicates no known allergies.  Home Medications   Current Outpatient Rx  Name  Route  Sig  Dispense  Refill  . aspirin 81 MG tablet   Oral   Take 1 tablet (81 mg total) by mouth daily. For: Blood thinner for heart health   30 tablet      . ibuprofen (ADVIL,MOTRIN) 200 MG tablet   Oral   Take 400 mg by mouth every 6 (six) hours as needed for pain.         . Liver Extract (LIVER PO)   Oral   Take 1 capsule by mouth daily. For liver support.         . Multiple Vitamin (MULTIVITAMIN WITH MINERALS) TABS   Oral   Take 1 tablet by mouth daily. For nutritional vitamin supplement           BP  114/69  Pulse 61  Temp(Src) 97.8 F (36.6 C) (Oral)  Resp 20  SpO2 100%  Physical Exam  Nursing note and vitals reviewed. Constitutional: He is oriented to person, place, and time. He appears well-developed and well-nourished. No distress.  HENT:  Head: Normocephalic and atraumatic.  Mouth/Throat: Oropharynx is clear and moist.  Eyes: EOM are normal. Pupils are equal, round, and reactive to light.  Neck: Normal range of motion. Neck supple.  Cardiovascular: Normal rate, regular rhythm, normal heart sounds and intact distal pulses.   Pulmonary/Chest: Effort normal and breath sounds normal. He has no  wheezes. He has no rales.  Abdominal: Soft. Bowel sounds are normal. He exhibits no distension. There is no tenderness. There is no rebound and no guarding.  Musculoskeletal: Normal range of motion. He exhibits no edema and no tenderness.  Lymphadenopathy:    He has no cervical adenopathy.  Neurological: He is alert and oriented to person, place, and time. He displays normal reflexes. No cranial nerve deficit. He exhibits normal muscle tone. Coordination normal.  Subjective paresthesia bilateral forehead. Normal sensation to bilaterall upper and lower extremities. 5/5 muscle strength upper and lower extremities. Normal speech. Normal cerebellar exam.  Skin: Skin is warm and dry. No rash noted.  Psychiatric: He has a normal mood and affect. His behavior is normal.    ED Course  Procedures (including critical care time)   Date: 05/25/2012  Rate: 67  Rhythm: normal sinus rhythm  QRS Axis: normal  Intervals: normal  ST/T Wave abnormalities: normal  Conduction Disutrbances:none  Narrative Interpretation:   Old EKG Reviewed: none available    Labs Reviewed  CBC WITH DIFFERENTIAL - Abnormal; Notable for the following:    RBC 4.12 (*)    MCH 35.4 (*)    Platelets 75 (*)    Neutrophils Relative 41 (*)    All other components within normal limits  COMPREHENSIVE METABOLIC PANEL - Abnormal; Notable for the following:    BUN 4 (*)    Total Protein 8.5 (*)    AST 115 (*)    ALT 56 (*)    All other components within normal limits  URINE RAPID DRUG SCREEN (HOSP PERFORMED) - Abnormal; Notable for the following:    Tetrahydrocannabinol POSITIVE (*)    All other components within normal limits  ETHANOL - Abnormal; Notable for the following:    Alcohol, Ethyl (B) 175 (*)    All other components within normal limits  URINALYSIS, ROUTINE W REFLEX MICROSCOPIC   Ct Head Wo Contrast  05/25/2012  *RADIOLOGY REPORT*  Clinical Data: Intermittent left arm numbness  CT HEAD WITHOUT CONTRAST   Technique:  Contiguous axial images were obtained from the base of the skull through the vertex without contrast.  Comparison: 11/14/2009  Findings: No skull fracture is noted. No intracranial hemorrhage, mass effect or midline shift.  Mild cerebral atrophy.  Bilateral basal ganglia punctate calcifications are noted.  Atherosclerotic calcifications of carotid siphon.  A subcutaneous metallic BB is noted in the right occipital lobe. A metallic BB is noted in the left posterior parietal scalp.  No acute infarction.  No mass lesion is noted on this unenhanced scan.  IMPRESSION: No acute intracranial abnormality.  Mild cerebral atrophy. Atherosclerotic calcifications of carotid siphon.   Original Report Authenticated By: Natasha Mead, M.D.      1. Alcohol addiction   2. Abnormal liver enzymes       MDM  18M with ho CAD s/p CABG, alcohol abuse, liver cirrhosis  here with multiple, vague, chronic neurologic complaints. He states for "years" he has had intermittent numbness and tingling of his left arm and pain of his left leg. Yesterday, his boss told him he was having slurred speech. Pt reports drinking alcohol heavy again during the last week because of a death in the family and his AA sponsor died. His last drink was this morning. Although these symptoms have been on-and-off for so long, he states he was concerned "he wouldn't wake up" if he didn't get checked out today. No focal neurologic deficits on exam. Normal speech. Normal ambulation. Vitals stable. Currently doubt CVA or TIA. Likely related to stress and increased use of alcohol. However, will obtain CT head and labs.  Head CT unremarkable. Labs reveal elevated LFTs but at baseline. Mild thrombocytopenia but expected for patient with cirrhosis. Etoh level 175. Doubt CVA or TIA. Felt symptoms related to drinking alcohol again. Given resources for rehab. Pt deemed stable for d/c home. Return precautions given.      Johnnette Gourd, MD 05/25/12 1352

## 2012-05-25 NOTE — ED Notes (Signed)
Pt here with complaint of left hand going numb.  Pt advises his right hand went numb yesterday, resolved last night.  Pt advises his hands went numb before his heart attack and he wants to be checked out.

## 2012-05-28 NOTE — ED Provider Notes (Signed)
I saw and evaluated the patient, reviewed the resident's note and I agree with the findings and plan.   .Face to face Exam:  General:  Awake HEENT:  Atraumatic Resp:  Normal effort Abd:  Nondistended Neuro:No focal weakness Lymph: No adenopathy   Nelia Shi, MD 05/28/12 1129

## 2012-05-31 ENCOUNTER — Emergency Department (HOSPITAL_COMMUNITY): Payer: No Typology Code available for payment source

## 2012-05-31 ENCOUNTER — Encounter (HOSPITAL_COMMUNITY): Payer: Self-pay | Admitting: Emergency Medicine

## 2012-05-31 ENCOUNTER — Emergency Department (HOSPITAL_COMMUNITY)
Admission: EM | Admit: 2012-05-31 | Discharge: 2012-05-31 | Disposition: A | Discharge status: Home / Self Care | Payer: No Typology Code available for payment source | Attending: Emergency Medicine | Admitting: Emergency Medicine

## 2012-05-31 DIAGNOSIS — Z7982 Long term (current) use of aspirin: Secondary | ICD-10-CM | POA: Insufficient documentation

## 2012-05-31 DIAGNOSIS — Z8619 Personal history of other infectious and parasitic diseases: Secondary | ICD-10-CM | POA: Insufficient documentation

## 2012-05-31 DIAGNOSIS — S93409A Sprain of unspecified ligament of unspecified ankle, initial encounter: Secondary | ICD-10-CM | POA: Insufficient documentation

## 2012-05-31 DIAGNOSIS — Y9301 Activity, walking, marching and hiking: Secondary | ICD-10-CM | POA: Insufficient documentation

## 2012-05-31 DIAGNOSIS — Z8669 Personal history of other diseases of the nervous system and sense organs: Secondary | ICD-10-CM | POA: Insufficient documentation

## 2012-05-31 DIAGNOSIS — S93401A Sprain of unspecified ligament of right ankle, initial encounter: Secondary | ICD-10-CM

## 2012-05-31 DIAGNOSIS — W108XXA Fall (on) (from) other stairs and steps, initial encounter: Secondary | ICD-10-CM | POA: Insufficient documentation

## 2012-05-31 DIAGNOSIS — Y92009 Unspecified place in unspecified non-institutional (private) residence as the place of occurrence of the external cause: Secondary | ICD-10-CM | POA: Insufficient documentation

## 2012-05-31 DIAGNOSIS — Z862 Personal history of diseases of the blood and blood-forming organs and certain disorders involving the immune mechanism: Secondary | ICD-10-CM | POA: Insufficient documentation

## 2012-05-31 DIAGNOSIS — K7460 Unspecified cirrhosis of liver: Secondary | ICD-10-CM | POA: Insufficient documentation

## 2012-05-31 DIAGNOSIS — Z79899 Other long term (current) drug therapy: Secondary | ICD-10-CM | POA: Insufficient documentation

## 2012-05-31 DIAGNOSIS — I739 Peripheral vascular disease, unspecified: Secondary | ICD-10-CM | POA: Insufficient documentation

## 2012-05-31 DIAGNOSIS — X500XXA Overexertion from strenuous movement or load, initial encounter: Secondary | ICD-10-CM | POA: Insufficient documentation

## 2012-05-31 MED ORDER — IBUPROFEN 200 MG PO TABS
600.0000 mg | ORAL_TABLET | Freq: Once | ORAL | Status: AC
  Administered 2012-05-31: 600 mg via ORAL
  Filled 2012-05-31: qty 3

## 2012-05-31 MED ORDER — IBUPROFEN 600 MG PO TABS: 600.0000 mg | ORAL_TABLET | Freq: Four times a day (QID) | ORAL | Status: DC | PRN

## 2012-05-31 MED ORDER — HYDROCODONE-ACETAMINOPHEN 5-325 MG PO TABS: 1.0000 | ORAL_TABLET | Freq: Four times a day (QID) | ORAL | Status: DC | PRN

## 2012-05-31 MED ORDER — HYDROCODONE-ACETAMINOPHEN 5-325 MG PO TABS
1.0000 | ORAL_TABLET | Freq: Once | ORAL | Status: AC
  Administered 2012-05-31: 1 via ORAL
  Filled 2012-05-31: qty 1

## 2012-05-31 NOTE — ED Provider Notes (Signed)
History  This chart was scribed for non-physician practitioner, Lottie Mussel, PA working with Vida Roller, MD by Shari Heritage, ED Scribe. This patient was seen in room WTR8/WTR8 and the patient's care was started at 1904.  CSN: [id number]  Arrival date & time 05/31/12  1857   First MD Initiated Contact with Patient 05/31/12 1904      Chief Complaint  Patient presents with  . Ankle Pain    Patient is a 62 y.o. male presenting with ankle pain. The history is provided by the patient. No language interpreter was used.  Ankle Pain Location:  Ankle Injury: yes   Mechanism of injury: fall   Fall:    Fall occurred:  Tripped (while going down stairs)   Impact surface:  Concrete   Entrapped after fall: no   Ankle location:  R ankle Pain details:    Quality:  Throbbing   Radiates to:  Does not radiate   Severity:  Moderate   Onset quality:  Sudden   Timing:  Constant   Progression:  Worsening Foreign body present:  No foreign bodies Ineffective treatments:  None tried Associated symptoms: no muscle weakness, no numbness and no tingling     HPI Comments: Zachary Steele is a 62 y.o. male who presents to the Emergency Department complaining of moderate, constant, non-radiating, throbbing, right ankle pain onset this morning. Patient states that he was walking down the steps at his house when he missed one and twisted his right ankle. Patient states that pain gradually got worse throughout the day and he is having difficulty bearing weight and ambulating due to pain. There is no numbness, weakness or tingling to the right lower extremity. Patient denies any other injuries or pain at this time. He hasn't taken any medicines for the pain. He hasn't sought treatment at another facility for this problem. Patient has a medical history of anemia, cataracts, peripheral artery disease, Hepatitis C and alcohol dependence.    Past Medical History  Diagnosis Date  . Anemia   . Cataract    . Hepatitis C     Has A and B also  . PAD (peripheral artery disease)     pt reports he has this and has chronic leg "soreness"  . Alcohol dependence   . Cirrhosis     Past Surgical History  Procedure Laterality Date  . Coronary artery bypass graft  2010    Triple bypass at [facility]    Family History  Problem Relation Age of Onset  . Stroke Mother     History  Substance Use Topics  . Smoking status: Current Some Day Smoker -- 0.20 packs/day for 49 years  . Smokeless tobacco: Never Used     Comment: Is trying to stop smoking, still has one or two per day most days  . Alcohol Use: 2.0 oz/week    4 drink(s) per week     Comment: pt drinks 1-2 bottles of wine daily      Review of Systems  Musculoskeletal: Positive for myalgias.  All other systems reviewed and are negative.    Allergies  Review of patient's allergies indicates no known allergies.  Home Medications   Current Outpatient Rx  Name  Route  Sig  Dispense  Refill  . aspirin 81 MG tablet   Oral   Take 1 tablet (81 mg total) by mouth daily. For: Blood thinner for heart health   30 tablet      . ibuprofen (ADVIL,MOTRIN)  200 MG tablet   Oral   Take 400 mg by mouth every 6 (six) hours as needed for pain.         . Liver Extract (LIVER PO)   Oral   Take 1 capsule by mouth daily. For liver support.         . Multiple Vitamin (MULTIVITAMIN WITH MINERALS) TABS   Oral   Take 1 tablet by mouth daily. For nutritional vitamin supplement           Triage Vitals: BP 165/91  Pulse 69  Temp(Src) 98.3 F (36.8 C) (Oral)  SpO2 100%  Physical Exam  Constitutional: He is oriented to person, place, and time. He appears well-developed and well-nourished.  HENT:  Head: Normocephalic and atraumatic.  Musculoskeletal:       Right ankle: Tenderness. Lateral malleolus tenderness found. No medial malleolus tenderness found. Achilles tendon normal.  No tenderness over right medial malleolus. Achilles tendon is  intact. Tender over the entire dorsal mid foot and lateral malleolus. Pain with dorsiflexion, plantarflexion, inversion and eversion of the foot. Pain with ROM of toes.   Neurological: He is alert and oriented to person, place, and time.  Skin: Skin is warm and dry.    ED Course  Procedures (including critical care time) DIAGNOSTIC STUDIES: Oxygen Saturation is 100% on room air, normal by my interpretation.    COORDINATION OF CARE: 7:14 PM- Patient informed of current plan for treatment and evaluation and agrees with plan at this time.     Dg Ankle Complete Right  05/31/2012  *RADIOLOGY REPORT*  Clinical Data: Fall, right ankle pain  RIGHT ANKLE - COMPLETE 3+ VIEW  Comparison: Concurrently obtained radiographs of the right foot  Findings: No acute fracture or malalignment.  The bones are mildly osteopenic.  The ankle mortise is congruent.  There is soft tissue swelling about the lateral malleolus.  Well corticated ossified bodies adjacent to the distal fibula and inferior to the medial malleolus may represent the sequela of remote healed trauma.  There is an ankle joint effusion.  Diffuse atherosclerotic calcifications noted throughout the anterior tibial and posterior tibial arterial distributions.  IMPRESSION: Positive for ankle joint effusion although no definite fracture is identified by conventional radiography.  If there is high clinical concern for occult or intra-articular fracture, CT scan could further evaluate.  Atherosclerotic vascular calcifications.   Original Report Authenticated By: Malachy Moan, M.D.      Dg Foot Complete Right  05/31/2012  *RADIOLOGY REPORT*  Clinical Data: Fall, right foot pain  RIGHT FOOT COMPLETE - 3+ VIEW  Comparison: Concurrently obtained radiographs the right ankle  Findings: No acute fracture, or malalignment identified.  Positive ankle joint effusion.  Diffuse atherosclerotic calcifications throughout the posterior tibial and anterior tibial  arteries.  Mild soft tissue swelling about the ankle.  IMPRESSION: Positive ankle joint effusion without definite acute fracture or malalignment identified.  If clinically warranted, CT scan could further evaluate.  Atherosclerotic vascular calcifications.   Original Report Authenticated By: Malachy Moan, M.D.       1. Ankle sprain, right, initial encounter       MDM  Right ankle pain and swelling. Twisting injury early. Has been walking on it all day, got worse. X-ray negative, but large joint effusion, question occult fracture. Splinted in posterior splint. Crutches given. Pain medications and follow up with orthopedics.        I personally performed the services described in this documentation, which was scribed in my presence.  The recorded information has been reviewed and is accurate.    Lottie Mussel, PA 05/31/12 2050

## 2012-05-31 NOTE — ED Notes (Signed)
Pt states he turned his ankle and hurt his foot today and is now unable to bear weight.

## 2012-06-01 NOTE — ED Provider Notes (Signed)
Medical screening examination/treatment/procedure(s) were performed by non-physician practitioner and as supervising physician I was immediately available for consultation/collaboration.    Lareina Espino D Levante Simones, MD 06/01/12 0010 

## 2014-07-19 ENCOUNTER — Emergency Department (HOSPITAL_COMMUNITY)
Admission: EM | Admit: 2014-07-19 | Discharge: 2014-07-19 | Disposition: A | Discharge status: Home / Self Care | Payer: Medicare PPO | Attending: Emergency Medicine | Admitting: Emergency Medicine

## 2014-07-19 ENCOUNTER — Encounter (HOSPITAL_COMMUNITY): Payer: Self-pay | Admitting: Emergency Medicine

## 2014-07-19 ENCOUNTER — Emergency Department (HOSPITAL_COMMUNITY): Payer: Medicare PPO

## 2014-07-19 DIAGNOSIS — Z862 Personal history of diseases of the blood and blood-forming organs and certain disorders involving the immune mechanism: Secondary | ICD-10-CM | POA: Diagnosis not present

## 2014-07-19 DIAGNOSIS — Z950 Presence of cardiac pacemaker: Secondary | ICD-10-CM | POA: Insufficient documentation

## 2014-07-19 DIAGNOSIS — R200 Anesthesia of skin: Secondary | ICD-10-CM | POA: Insufficient documentation

## 2014-07-19 DIAGNOSIS — Z8719 Personal history of other diseases of the digestive system: Secondary | ICD-10-CM | POA: Diagnosis not present

## 2014-07-19 DIAGNOSIS — Z8679 Personal history of other diseases of the circulatory system: Secondary | ICD-10-CM

## 2014-07-19 DIAGNOSIS — Z720 Tobacco use: Secondary | ICD-10-CM | POA: Diagnosis not present

## 2014-07-19 DIAGNOSIS — H269 Unspecified cataract: Secondary | ICD-10-CM | POA: Insufficient documentation

## 2014-07-19 DIAGNOSIS — Z8619 Personal history of other infectious and parasitic diseases: Secondary | ICD-10-CM | POA: Diagnosis not present

## 2014-07-19 DIAGNOSIS — M1990 Unspecified osteoarthritis, unspecified site: Secondary | ICD-10-CM

## 2014-07-19 HISTORY — DX: Peripheral vascular disease, unspecified: I73.9

## 2014-07-19 MED ORDER — NAPROXEN 375 MG PO TABS: 375.0000 mg | ORAL_TABLET | Freq: Two times a day (BID) | ORAL | Status: DC

## 2014-07-19 MED ORDER — KETOROLAC TROMETHAMINE 60 MG/2ML IM SOLN
60.0000 mg | Freq: Once | INTRAMUSCULAR | Status: AC
  Administered 2014-07-19: 60 mg via INTRAMUSCULAR
  Filled 2014-07-19: qty 2

## 2014-07-19 NOTE — ED Notes (Signed)
Patient transported to X-ray 

## 2014-07-19 NOTE — Discharge Instructions (Signed)
Please follow the directions provided. Use the resource guide or the referral given to find a primary care doctor to follow-up with. Please take the naproxen twice a day to help with pain and inflammation. Don't hesitate to return for any new, worsening, or concerning symptoms.   SEEK IMMEDIATE MEDICAL CARE IF:  You have a fever.  You develop severe joint pain, swelling or redness.  Many joints are involved and become painful and swollen.  There is severe back pain and/or leg weakness.  You have loss of bowel or bladder control.    Emergency Department Resource Guide 1) Find a Doctor and Pay Out of Pocket Although you won't have to find out who is covered by your insurance plan, it is a good idea to ask around and get recommendations. You will then need to call the office and see if the doctor you have chosen will accept you as a new patient and what types of options they offer for patients who are self-pay. Some doctors offer discounts or will set up payment plans for their patients who do not have insurance, but you will need to ask so you aren't surprised when you get to your appointment.  2) Contact Your Local Health Department Not all health departments have doctors that can see patients for sick visits, but many do, so it is worth a call to see if yours does. If you don't know where your local health department is, you can check in your phone book. The CDC also has a tool to help you locate your state's health department, and many state websites also have listings of all of their local health departments.  3) Find a [facility] If your illness is not likely to be very severe or complicated, you may want to try a walk in clinic. These are popping up all over the country in pharmacies, drugstores, and shopping centers. They're usually staffed by nurse practitioners or physician assistants that have been trained to treat common illnesses and complaints. They're usually fairly quick and  inexpensive. However, if you have serious medical issues or chronic medical problems, these are probably not your best option.  No Primary Care Doctor: - Call Health Connect at  [phone] - they can help you locate a primary care doctor that  accepts your insurance, provides certain services, etc. - Physician Referral Service- [phone]  Chronic Pain Problems: Organization         Address  Phone   Notes  [facility]  [phone] Patients need to be referred by their primary care doctor.   Medication Assistance: Organization         Address  Phone   Notes  [facility] Medication [facility] [address] [place], [place] [address] [phone] --Must be a resident of [facility] -- Must have NO insurance coverage whatsoever (no Medicaid/ Medicare, etc.) -- The pt. MUST have a primary care doctor that directs their care regularly and follows them in the community   MedAssist  [phone]   [employer]  [phone]    Agencies that provide inexpensive medical care: Organization         Address  Phone   Notes  [place]  [phone]   [facility]    [phone]   [facility] [place], [place] [address] [phone]   [facility] [phone])  [place]    [phone]   [facility]    [phone]   [place] and [place] [address], [place] Phone:  [phone], Fax:  [phone] Hours of Operation:  9 am - 6 pm, M-F.  Also accepts Medicaid/Medicare and self-pay.  [facility] for [place], [address], [place] Phone: [phone], Fax: [phone]. Hours of Operation:  8:30 am - 5:30 pm, M-F.  Also accepts Medicaid and self-pay.  [facility] [place] [address] [place] Phone: [phone]   [place], [place], [place] [phone], Ext. [phone] Mondays & Thursdays: 7-9 AM.  First 15 patients are seen on a first come, first serve basis.    [place] Providers:  Organization         Address  Phone   Notes  [facility] [address],  [phone] Also accepts self-pay patients.  [facility] [address], [place]  [phone]   [place], [address], [place] [phone]   [facility] [address] [place] [phone]   Lucianne Lei [address], [place]   [phone] Only accepts [place] Access [place] patients after they have their name applied to their card.   Self-Pay (no insurance) in [facility]:  Organization         Address  Phone   Notes  Sickle Cell Patients, [facility] [place] [phone]   [facility] [place] [phone]   [facility] [place]  [place], [place], [place] [phone]   [facility]/Dr. Osei-Bonsu  [address] [place] or [place] [address], [place] [phone] Phone number for both [place] and [place] locations is the same.  [facility] [address] [place] [phone]   [facility] [address] [place] or [address] [phone]   [facility] [address] [place] [phone], phone; [phone], fax Sees patients 1st and 3rd Saturday of every month.  Must not qualify for public or private insurance (i.e. Medicaid, Medicare, [place] Health Choice, Veterans' Benefits)  Household income should be no more than 200% of the poverty level The clinic cannot treat you if you are pregnant or  think you are pregnant  Sexually transmitted diseases are not treated at the clinic.    Dental Care: Organization         Address  Phone  Notes  [facility] of [facility] [place] [phone] Accepts children up to age 58 who are enrolled in [place] or [place]; pregnant women with a Medicaid card; and children who have applied for Medicaid or [place] Health Choice, but were declined, whose parents can pay a reduced fee at time of service.  [facility] Department of [facility]  [address], [place] [phone] Accepts children up to age 40 who are enrolled in [place] or [place]; pregnant women with a Medicaid card; and children who have applied for Medicaid or [place] Health Choice,  but were declined, whose parents can pay a reduced fee at time of service.  [place] Adult Dental Access PROGRAM  [place] [phone] Patients are seen by appointment only. Walk-ins are not accepted. [place] will see patients 20 years of age and older. Monday - Tuesday (8am-5pm) Most Wednesdays (8:30-5pm) $30 per visit, cash only  [facility] Adult Dental Access PROGRAM  [address], [facility] [phone] Patients are seen by appointment only. Walk-ins are not accepted. [place] will see patients 87 years of age and older. One Wednesday Evening (Monthly: Volunteer Based).  $30 per visit, cash only  [place]  [phone] for adults; Children under age 63, call Graduate Pediatric Dentistry at [phone]. Children aged 70-14, please call [phone] to request a pediatric application.  Dental services are provided in all areas of dental care including fillings, crowns and bridges, complete and partial dentures, implants, gum treatment, root canals, and extractions. Preventive care is also provided. Treatment is provided to both adults  and children. Patients are selected via a lottery and there is often a waiting list.   [facility] [address] [place]  [phone] [email or web address]   [facility] [address] [place], [place] [phone], Ext. [phone] Second and Fourth Thursday of each month, opens at 6:30 AM; Clinic ends at 9 AM.  Patients are seen on a first-come first-served basis, and a limited number are seen during each clinic.   [facility]  [address] Hillard Danker [place], [place] [phone]   Eligibility Requirements You must have lived in [place], [place], or [place] counties for at least the last three months.   You cannot be eligible for state or federal sponsored [employer], including [employer], [place], or [employer].   You generally cannot be eligible for healthcare insurance through your employer.    How to apply: Eligibility screenings are held every Tuesday and Wednesday afternoon from 1:00 pm until 4:00 pm. You do not need an appointment for the interview!  [facility] [address] [place], [place]   [place]  [place] Department  [place]  [phone]    Behavioral Health Resources in the Community: Intensive Outpatient Programs Organization         Address  Phone  Notes  [place]. [address], [place], [place] [phone]   [facility] Outpatient [address] [place], [place]   ADS: Alcohol & Drug S[facility] [address] [place], [place]   [place] [address]  [place], [place] or [phone]   Substance Abuse Resources Organization         Address  Phone  Notes  Alcohol and Drug Services  [phone]   [place]  [phone]   [facility] [place]     Chinita Pester  [phone]   Residential & Outpatient Substance Abuse Program  [phone]   Psychological Services Organization         Address  Phone  Notes  [facility] [place]  [place]  [phone]   [place] [address] [place] or [phone]    Mobile Crisis Teams Organization         Address  Phone  Notes  [facility], Mobile Crisis Care Unit  [phone]   [facility]  [address] [place], [place]   [facility] [address] [place] [address], [place] [phone]    Self-Help/Support Groups Organization         Address  Phone             Notes  [place]. of [place] - variety of support groups  [place] Call for more information  Narcotics Anonymous (NA), [facility] [address], [employer] [place]  2 meetings at this location   Special educational needs teacher         Address  Phone  Notes  [facility] [place],    [place]  [phone]   [facility]  [address] [place] [address], [place], [place]   [place], [place] [phone] Admissions: 8am-3pm M-F  Incentives Substance [place] [address]    [place], [place] [phone]   [facility] [address] [place], [place], [place]   [facility] [address]  [place], [place]   [facility] - Intensive Outpatient [address] Kristeen [address], [place], [place]   [facility].,  [place], [place] [phone] or [phone]   [facility] Services (RTS) [address] [place], [place] Accepts Medicaid  [facility] [place] [address],  [place] [phone] Substance Abuse/Addiction Treatment   [facility] Organization         Address  Phone  Notes  [facility]  [phone]   Domenic Schwab, PhD [address] Arlis Porta [place], [place]   [phone] or [phone]   [place], [place] [phone]   [facility] [address], [place], [place] [phone] Insurance/Medicaid/sponsorship through [facility] and Families [address] [place]                                    [place], [place] [phone] [place] [address], [place] [phone]    Dr. Adele Schilder  [phone]   [facility] Dept. 1) [address], [place] 2) [place] 3)  [place] [address], [place] [phone]  [phone]   [place] [phone] or [phone] (After Hours)

## 2014-07-19 NOTE — ED Notes (Signed)
Pt. reports chronic left leg numbness for several months , denies injury , ambulatory . Denies pain / respirations unlabored .

## 2014-07-19 NOTE — ED Provider Notes (Signed)
CSN: [id number]     Arrival date & time 07/19/14  2125 History   First MD Initiated Contact with Patient 07/19/14 2206     Chief Complaint  Patient presents with  . Numbness   (Consider location/radiation/quality/duration/timing/severity/associated sxs/prior Treatment) HPI Zachary Steele is a 64 yo male presenting with report of multiple ongoing complaints including bilat tingling in legs and swelling and soreness in bilat hands, left greater than right.  He reports he has a history of PAD which results in tingling in his legs from time to time, but he has noticed it more when ambulating.  His most bothersome symptoms however, is pain and reported swelling in the joints in his left hand and wrist.  He states he has it in both hands but feels like the left hand is worse.  He states over the last few weeks he feels like his hands are more swollen and feels tight when making a fist.  He denies any redness in either hand.  He denies, fevers, chills, unilateral leg pain or leg swelling or chest pain or shortness of breath.  Past Medical History  Diagnosis Date  . Anemia   . Cataract   . Hepatitis C     Has A and B also  . PAD (peripheral artery disease)     pt reports he has this and has chronic leg "soreness"  . Alcohol dependence   . Cirrhosis   . Peripheral vascular disease    Past Surgical History  Procedure Laterality Date  . Coronary artery bypass graft  2010    Triple bypass at [facility]   Family History  Problem Relation Age of Onset  . Stroke Mother    History  Substance Use Topics  . Smoking status: Current Some Day Smoker -- 0.20 packs/day for 49 years  . Smokeless tobacco: Never Used     Comment: Is trying to stop smoking, still has one or two per day most days  . Alcohol Use: 2.0 oz/week    4 Standard drinks or equivalent per week     Comment: pt drinks 1-2 bottles of wine daily    Review of Systems  Constitutional: Negative for fever and chills.  HENT: Negative for  sore throat.   Eyes: Negative for visual disturbance.  Respiratory: Negative for cough and shortness of breath.   Cardiovascular: Negative for chest pain and leg swelling.  Gastrointestinal: Negative for nausea, vomiting and diarrhea.  Genitourinary: Negative for dysuria.  Musculoskeletal: Positive for joint swelling and arthralgias. Negative for myalgias.  Skin: Negative for rash.  Neurological: Negative for weakness, numbness ( Intermittent paresthesias) and headaches.    Allergies  Review of patient's allergies indicates no known allergies.  Home Medications   Prior to Admission medications   Medication Sig Start Date End Date Taking? Authorizing Provider  aspirin 81 MG tablet Take 1 tablet (81 mg total) by mouth daily. For: Blood thinner for heart health 09/23/11   Encarnacion Slates, NP  HYDROcodone-acetaminophen (NORCO) 5-325 MG per tablet Take 1 tablet by mouth every 6 (six) hours as needed for pain. 05/31/12   Tatyana Kirichenko, PA-C  ibuprofen (ADVIL,MOTRIN) 200 MG tablet Take 400 mg by mouth every 6 (six) hours as needed for pain.    Historical Provider, MD  ibuprofen (ADVIL,MOTRIN) 600 MG tablet Take 1 tablet (600 mg total) by mouth every 6 (six) hours as needed for pain. 05/31/12   Tatyana Kirichenko, PA-C  Liver Extract (LIVER PO) Take 1 capsule by mouth  daily. For liver support.    Historical Provider, MD  Multiple Vitamin (MULTIVITAMIN WITH MINERALS) TABS Take 1 tablet by mouth daily. For nutritional vitamin supplement 09/23/11   Encarnacion Slates, NP   BP 129/75 mmHg  Pulse 74  Temp(Src) 97.8 F (36.6 C) (Oral)  Resp 14  Ht 5\' 7"  (1.702 m)  Wt 133 lb 4.8 oz (60.464 kg)  BMI 20.87 kg/m2  SpO2 98% Physical Exam  Constitutional: He is oriented to person, place, and time. He appears well-developed and well-nourished. No distress.  HENT:  Head: Normocephalic and atraumatic.  Mouth/Throat: Oropharynx is clear and moist. No oropharyngeal exudate.  Eyes: Conjunctivae are normal.   Neck: Neck supple. No thyromegaly present.  Cardiovascular: Normal rate, regular rhythm and intact distal pulses.   Pulmonary/Chest: Effort normal and breath sounds normal. No respiratory distress. He has no wheezes. He has no rales. He exhibits no tenderness.  Abdominal: Soft. There is no tenderness.  Musculoskeletal: He exhibits tenderness.  Mild joint swelling noted to MCP of left 2nd and 3rd finger, no redness, warmth or effusion noted.  5/5 strength noted with flexion and extension of all fingers and wrist  Lymphadenopathy:    He has no cervical adenopathy.  Neurological: He is alert and oriented to person, place, and time. No cranial nerve deficit. Coordination normal.  Skin: Skin is warm and dry. No rash noted. He is not diaphoretic.  Psychiatric: He has a normal mood and affect.  Nursing note and vitals reviewed.   ED Course  Procedures (including critical care time) Labs Review Labs Reviewed - No data to display  Imaging Review Dg Hand Complete Left  07/19/2014   CLINICAL DATA:  Left hand numbness for 2 weeks  EXAM: LEFT HAND - COMPLETE 3+ VIEW  COMPARISON:  None.  FINDINGS: Three views of the left hand submitted. No acute fracture or subluxation. Degenerative changes are noted first carpal metacarpal joint and first metacarpophalangeal joint. Diffuse osteopenia. Narrowing of radiocarpal joint space.  IMPRESSION: No acute fracture or subluxation. Degenerative changes as described above. Diffuse osteopenia.   Electronically Signed   By: Lahoma Crocker M.D.   On: 07/19/2014 22:58     EKG Interpretation None      MDM   Final diagnoses:  Arthritis  History of peripheral arterial disease   64 yo male with symptoms consistent with arthritis in his hands.  His x-ray is negative for fracture but shows joint narrowing and osteopenia.  His pain was managed in the ED.  Discussed the importance of follow-up with a primary care provider for ongoing management of the hand pain and his  known diagnosis of peripheral arterial disease.  Will start on a short course of low dose of daily NSAIDS as he has no history of renal impairment and resources provided to establish care with a PCP.  He is well-appearing, in no acute distress and vital signs reviewed and not concerning. He appears safe to be discharged. Return precautions provided.  Pt aware plan and in agreement.     Filed Vitals:   07/19/14 2133 07/19/14 2329  BP: 129/75 121/74  Pulse: 74 64  Temp: 97.8 F (36.6 C) 98 F (36.7 C)  TempSrc: Oral Oral  Resp: 14 14  Height: 5\' 7"  (1.702 m)   Weight: 133 lb 4.8 oz (60.464 kg)   SpO2: 98% 100%   Meds given in ED:  Medications  ketorolac (TORADOL) injection 60 mg (60 mg Intramuscular Given 07/19/14 2255)    Discharge  Medication List as of 07/19/2014 11:29 PM    START taking these medications   Details  naproxen (NAPROSYN) 375 MG tablet Take 1 tablet (375 mg total) by mouth 2 (two) times daily., Starting 07/19/2014, Until Discontinued, Print           Britt Bottom, NP 07/19/14 2348  Quintella Reichert, MD 07/20/14 [phone]

## 2014-07-19 NOTE — ED Notes (Signed)
Patient returned from xray.Herbalist given

## 2014-07-19 NOTE — ED Notes (Signed)
Zachary Steele at bedside.  Pins and needles in lle for awhile.  Also in RLE as well.  C/o pain in left hand from fingers radiating to wrist.  Hurts with movement.  No recent injury.

## 2014-08-02 ENCOUNTER — Telehealth: Payer: Self-pay | Admitting: Cardiovascular Disease

## 2014-08-02 NOTE — Telephone Encounter (Signed)
Received records from [place] @ [facility] for appointment with Dr Gwenlyn Found on 08/30/14.  Records given to [facility] (medical records) for Dr Kennon Holter schedule on 08/30/14. lp

## 2014-08-14 ENCOUNTER — Encounter: Payer: Self-pay | Admitting: *Deleted

## 2014-08-30 ENCOUNTER — Ambulatory Visit: Payer: Self-pay | Admitting: Cardiovascular Disease

## 2014-09-13 ENCOUNTER — Ambulatory Visit (INDEPENDENT_AMBULATORY_CARE_PROVIDER_SITE_OTHER): Payer: Medicare PPO | Admitting: Cardiovascular Disease

## 2014-09-13 ENCOUNTER — Encounter: Payer: Self-pay | Admitting: Cardiovascular Disease

## 2014-09-13 VITALS — BP 134/74 | HR 62 | Ht 67.0 in | Wt 131.9 lb

## 2014-09-13 DIAGNOSIS — I2510 Atherosclerotic heart disease of native coronary artery without angina pectoris: Secondary | ICD-10-CM

## 2014-09-13 DIAGNOSIS — I739 Peripheral vascular disease, unspecified: Secondary | ICD-10-CM

## 2014-09-13 DIAGNOSIS — Z951 Presence of aortocoronary bypass graft: Secondary | ICD-10-CM | POA: Insufficient documentation

## 2014-09-13 DIAGNOSIS — I2583 Coronary atherosclerosis due to lipid rich plaque: Secondary | ICD-10-CM

## 2014-09-13 NOTE — Assessment & Plan Note (Signed)
History of CAD status post coronary artery bypass grafting January 2010. He will limit to his LAD, vein to the circumflex and RCA performed by Dr. Prescott Gum  He had a Myoview stress test performed 11/15/10 which was low risk and nonischemic.he denies chest pain but does get some dyspnea on exertion.

## 2014-09-13 NOTE — Patient Instructions (Signed)
Dr Gwenlyn Found has requested that you have a lower extremity arterial duplex. This test is an ultrasound of the arteries in the legs. It looks at arterial blood flow in the legs. Allow one hour for Lower Arterial scans. There are no restrictions or special instructions.  Dr Gwenlyn Found recommends that you schedule a follow-up appointment after you have completed the study.

## 2014-09-13 NOTE — Progress Notes (Signed)
09/13/2014 Zachary Steele   April 17, 1950  [id number]  Primary Physician No PCP Per Patient Primary Cardiologist: Lorretta Harp MD Renae Gloss   HPI:  Zachary Steele is a 64 year old thin appearing married African-American male father of one child who currently is out of work. He has no primary care physician. He was referred by the emergency room for evaluation of peripheral arterial disease. He was remotely a patient of Dr. Shanon Brow Harding's, who last saw him in the office 11/13/10. He has a history of tobacco abuse only smoking 1-4 cigars a day. He also has a history of polysubstance abuse including marijuana and alcohol cirrhosis alcohol cirrhosis.Marland Kitchen He has a history of coronary artery disease status post bypass grafting January 2010 with Myoview performed 11/15/10 was nonischemic. He did have a partially Dopplers performed in our office 01/03/11 showed normal ABIs with tibial vessel disease. He complains of lower extremity discomfort which is somewhat atypical for claudication along with numbness. He is not a diabetic.   Current Outpatient Prescriptions  Medication Sig Dispense Refill  . aspirin 81 MG tablet Take 1 tablet (81 mg total) by mouth daily. For: Blood thinner for heart health 30 tablet   . HYDROcodone-acetaminophen (NORCO) 5-325 MG per tablet Take 1 tablet by mouth every 6 (six) hours as needed for pain. 20 tablet 0  . ibuprofen (ADVIL,MOTRIN) 200 MG tablet Take 400 mg by mouth every 6 (six) hours as needed for pain.    . Liver Extract (LIVER PO) Take 1 capsule by mouth daily. For liver support.    . Multiple Vitamin (MULTIVITAMIN WITH MINERALS) TABS Take 1 tablet by mouth daily. For nutritional vitamin supplement    . naproxen (NAPROSYN) 375 MG tablet Take 1 tablet (375 mg total) by mouth 2 (two) times daily. 20 tablet 0   No current facility-administered medications for this visit.    No Known Allergies  History   Social History  . Marital Status: Married   Spouse Name: N/A  . Number of Children: N/A  . Years of Education: N/A   Occupational History  . Not on file.   Social History Main Topics  . Smoking status: Current Some Day Smoker -- 0.20 packs/day for 49 years  . Smokeless tobacco: Never Used     Comment: Is trying to stop smoking, still has one or two per day most days  . Alcohol Use: 2.0 oz/week    4 Standard drinks or equivalent per week     Comment: pt drinks 1-2 bottles of wine daily  . Drug Use: Yes    Special: Marijuana  . Sexual Activity: Yes    Birth Control/ Protection: Post-menopausal   Other Topics Concern  . Not on file   Social History Narrative     Review of Systems: General: negative for chills, fever, night sweats or weight changes.  Cardiovascular: negative for chest pain, dyspnea on exertion, edema, orthopnea, palpitations, paroxysmal nocturnal dyspnea or shortness of breath Dermatological: negative for rash Respiratory: negative for cough or wheezing Urologic: negative for hematuria Abdominal: negative for nausea, vomiting, diarrhea, bright red blood per rectum, melena, or hematemesis Neurologic: negative for visual changes, syncope, or dizziness All other systems reviewed and are otherwise negative except as noted above.    Blood pressure 134/74, pulse 62, height 5\' 7"  (1.702 m), weight 131 lb 14.4 oz (59.829 kg).  General appearance: alert and no distress Neck: no adenopathy, no carotid bruit, no JVD, supple, symmetrical, trachea midline and thyroid not  enlarged, symmetric, no tenderness/mass/nodules Lungs: clear to auscultation bilaterally Heart: regular rate and rhythm, S1, S2 normal, no murmur, click, rub or gallop Extremities: extremities normal, atraumatic, no cyanosis or edema and palpable pedal pulses  EKG not performed today  ASSESSMENT AND PLAN:   Peripheral arterial disease The patient was referred by the  [facility] emergency room for evaluation of PAD. He was seen because  of atypical grocery discomfort both at rest and with ambulation as well as numbness in both legs. Does have a history of tobacco abuse. He had Doppler studies performed at our office 01/03/11 revealing normal ABIs bilaterally, and tibial vessel disease. He does have palpable pedal pulses on exam. I'm going to repeat his lower extremity arterial Doppler studies.   Coronary artery disease History of CAD status post coronary artery bypass grafting January 2010. He will limit to his LAD, vein to the circumflex and RCA performed by Dr. Prescott Gum  He had a Myoview stress test performed 11/15/10 which was low risk and nonischemic.he denies chest pain but does get some dyspnea on exertion.       Lorretta Harp MD FACP,FACC,FAHA, [facility] 09/13/2014 2:13 PM

## 2014-09-13 NOTE — Assessment & Plan Note (Signed)
The patient was referred by the  [facility] emergency room for evaluation of PAD. He was seen because of atypical grocery discomfort both at rest and with ambulation as well as numbness in both legs. Does have a history of tobacco abuse. He had Doppler studies performed at our office 01/03/11 revealing normal ABIs bilaterally, and tibial vessel disease. He does have palpable pedal pulses on exam. I'm going to repeat his lower extremity arterial Doppler studies.

## 2014-09-29 ENCOUNTER — Ambulatory Visit (HOSPITAL_COMMUNITY)
Admission: RE | Admit: 2014-09-29 | Discharge: 2014-09-29 | Disposition: A | Discharge status: Home / Self Care | Payer: Medicare PPO | Source: Ambulatory Visit | Attending: Cardiovascular Disease | Admitting: Cardiovascular Disease

## 2014-09-29 DIAGNOSIS — I739 Peripheral vascular disease, unspecified: Secondary | ICD-10-CM | POA: Insufficient documentation

## 2014-10-31 ENCOUNTER — Ambulatory Visit: Payer: Self-pay | Admitting: Cardiovascular Disease

## 2014-11-01 ENCOUNTER — Encounter: Payer: Self-pay | Admitting: Cardiovascular Disease

## 2014-11-01 ENCOUNTER — Ambulatory Visit (INDEPENDENT_AMBULATORY_CARE_PROVIDER_SITE_OTHER): Payer: Medicare PPO | Admitting: Cardiovascular Disease

## 2014-11-01 VITALS — BP 126/78 | HR 75 | Ht 67.0 in | Wt 130.0 lb

## 2014-11-01 DIAGNOSIS — I739 Peripheral vascular disease, unspecified: Secondary | ICD-10-CM | POA: Diagnosis not present

## 2014-11-01 NOTE — Progress Notes (Signed)
Mr. Packard returns for follow-up of his lower extremity arterial Dopplers done because of atypical lower extremity discomfort. I did not think he had claudication. His Dopplers essentially confirm this with two-vessel runoff bilaterally. He has not occluded dorsalis pedis on the right and posterior tibial on the left otherwise normal circulation. I reassured him that his symptoms are  most likely not vascular in nature. He is otherwise stable. We'll see him back in 12 months.

## 2014-11-01 NOTE — Assessment & Plan Note (Signed)
Zachary Steele returns today for follow-up of his lower extremity arterial Doppler studies which were performed 09/29/14 revealing essentially normal circulation with two-vessel runoff bilaterally. I do not think his pain is cardiovascular in nature. It does sound either like restless leg syndrome or peripheral neuropathy. I've reassured him  And we'll see him back in one year for follow-up

## 2014-11-01 NOTE — Patient Instructions (Signed)
Your physician recommends that you schedule a follow-up appointment in 1 year with an extender. Dr Gwenlyn Found recommends that you schedule a follow-up appointment in 2 years. You will receive a reminder letter in the mail two months in advance. If you don't receive a letter, please call our office to schedule the follow-up appointment.

## 2015-02-05 ENCOUNTER — Encounter (HOSPITAL_COMMUNITY): Payer: Self-pay | Admitting: Emergency Medicine

## 2015-02-05 ENCOUNTER — Emergency Department (HOSPITAL_COMMUNITY): Payer: Medicare PPO

## 2015-02-05 ENCOUNTER — Emergency Department (HOSPITAL_COMMUNITY)
Admission: EM | Admit: 2015-02-05 | Discharge: 2015-02-05 | Disposition: A | Discharge status: Home / Self Care | Payer: Medicare PPO | Attending: Emergency Medicine | Admitting: Emergency Medicine

## 2015-02-05 DIAGNOSIS — Z862 Personal history of diseases of the blood and blood-forming organs and certain disorders involving the immune mechanism: Secondary | ICD-10-CM | POA: Insufficient documentation

## 2015-02-05 DIAGNOSIS — Z8619 Personal history of other infectious and parasitic diseases: Secondary | ICD-10-CM | POA: Diagnosis not present

## 2015-02-05 DIAGNOSIS — Z9889 Other specified postprocedural states: Secondary | ICD-10-CM | POA: Diagnosis not present

## 2015-02-05 DIAGNOSIS — Z79899 Other long term (current) drug therapy: Secondary | ICD-10-CM | POA: Insufficient documentation

## 2015-02-05 DIAGNOSIS — Z951 Presence of aortocoronary bypass graft: Secondary | ICD-10-CM | POA: Insufficient documentation

## 2015-02-05 DIAGNOSIS — H269 Unspecified cataract: Secondary | ICD-10-CM | POA: Insufficient documentation

## 2015-02-05 DIAGNOSIS — R05 Cough: Secondary | ICD-10-CM | POA: Diagnosis present

## 2015-02-05 DIAGNOSIS — S39011S Strain of muscle, fascia and tendon of abdomen, sequela: Secondary | ICD-10-CM

## 2015-02-05 DIAGNOSIS — Z8719 Personal history of other diseases of the digestive system: Secondary | ICD-10-CM | POA: Diagnosis not present

## 2015-02-05 DIAGNOSIS — Z720 Tobacco use: Secondary | ICD-10-CM | POA: Insufficient documentation

## 2015-02-05 DIAGNOSIS — X58XXXS Exposure to other specified factors, sequela: Secondary | ICD-10-CM | POA: Insufficient documentation

## 2015-02-05 DIAGNOSIS — R059 Cough, unspecified: Secondary | ICD-10-CM

## 2015-02-05 DIAGNOSIS — I2510 Atherosclerotic heart disease of native coronary artery without angina pectoris: Secondary | ICD-10-CM | POA: Diagnosis not present

## 2015-02-05 DIAGNOSIS — Z7982 Long term (current) use of aspirin: Secondary | ICD-10-CM | POA: Insufficient documentation

## 2015-02-05 LAB — CBC WITH DIFFERENTIAL/PLATELET
Basophils Absolute: 0 10*3/uL (ref 0.0–0.1)
Basophils Relative: 0 %
Eosinophils Absolute: 0.2 10*3/uL (ref 0.0–0.7)
Eosinophils Relative: 3 %
HCT: 42.5 % (ref 39.0–52.0)
Hemoglobin: 14.8 g/dL (ref 13.0–17.0)
Lymphocytes Relative: 32 %
Lymphs Abs: 1.8 10*3/uL (ref 0.7–4.0)
MCH: 36 pg — ABNORMAL HIGH (ref 26.0–34.0)
MCHC: 34.8 g/dL (ref 30.0–36.0)
MCV: 103.4 fL — ABNORMAL HIGH (ref 78.0–100.0)
Monocytes Absolute: 0.8 10*3/uL (ref 0.1–1.0)
Monocytes Relative: 14 %
Neutro Abs: 2.8 10*3/uL (ref 1.7–7.7)
Neutrophils Relative %: 51 %
Platelets: 79 10*3/uL — ABNORMAL LOW (ref 150–400)
RBC: 4.11 MIL/uL — ABNORMAL LOW (ref 4.22–5.81)
RDW: 12.8 % (ref 11.5–15.5)
WBC: 5.6 10*3/uL (ref 4.0–10.5)

## 2015-02-05 LAB — URINALYSIS, ROUTINE W REFLEX MICROSCOPIC
Glucose, UA: NEGATIVE mg/dL
Hgb urine dipstick: NEGATIVE
Ketones, ur: 40 mg/dL — AB
Leukocytes, UA: NEGATIVE
Nitrite: NEGATIVE
Protein, ur: NEGATIVE mg/dL
Specific Gravity, Urine: 1.015 (ref 1.005–1.030)
Urobilinogen, UA: 4 mg/dL — ABNORMAL HIGH (ref 0.0–1.0)
pH: 6 (ref 5.0–8.0)

## 2015-02-05 LAB — BASIC METABOLIC PANEL
Anion gap: 12 (ref 5–15)
BUN: 10 mg/dL (ref 6–20)
CO2: 24 mmol/L (ref 22–32)
Calcium: 8.6 mg/dL — ABNORMAL LOW (ref 8.9–10.3)
Chloride: 98 mmol/L — ABNORMAL LOW (ref 101–111)
Creatinine, Ser: 0.79 mg/dL (ref 0.61–1.24)
GFR calc Af Amer: >60 mL/min (ref >60)
GFR calc non Af Amer: >60 mL/min (ref >60)
Glucose, Bld: 71 mg/dL (ref 65–99)
Potassium: 4.2 mmol/L (ref 3.5–5.1)
Sodium: 134 mmol/L — ABNORMAL LOW (ref 135–145)

## 2015-02-05 MED ORDER — LORAZEPAM 2 MG/ML IJ SOLN: 0.0000 mg | Freq: Two times a day (BID) | INTRAMUSCULAR | Status: DC

## 2015-02-05 MED ORDER — LORAZEPAM 1 MG PO TABS: 0.0000 mg | ORAL_TABLET | Freq: Two times a day (BID) | ORAL | Status: DC

## 2015-02-05 MED ORDER — GUAIFENESIN-CODEINE 100-10 MG/5ML PO SOLN
5.0000 mL | Freq: Four times a day (QID) | ORAL | Status: DC | PRN
Start: 2015-02-05 — End: 2015-10-24

## 2015-02-05 MED ORDER — LORAZEPAM 1 MG PO TABS: 0.0000 mg | ORAL_TABLET | Freq: Four times a day (QID) | ORAL | Status: DC

## 2015-02-05 MED ORDER — THIAMINE HCL 100 MG/ML IJ SOLN: 100.0000 mg | Freq: Every day | INTRAMUSCULAR | Status: DC

## 2015-02-05 MED ORDER — VITAMIN B-1 100 MG PO TABS
100.0000 mg | ORAL_TABLET | Freq: Every day | ORAL | Status: DC
  Administered 2015-02-05: 100 mg via ORAL
  Filled 2015-02-05: qty 1

## 2015-02-05 MED ORDER — LORAZEPAM 2 MG/ML IJ SOLN: 0.0000 mg | Freq: Four times a day (QID) | INTRAMUSCULAR | Status: DC

## 2015-02-05 MED ORDER — HYDROCODONE-ACETAMINOPHEN 5-325 MG PO TABS
1.0000 | ORAL_TABLET | Freq: Once | ORAL | Status: AC
  Administered 2015-02-05: 1 via ORAL
  Filled 2015-02-05: qty 1

## 2015-02-05 NOTE — Discharge Instructions (Signed)
Your work up showed no concerning abnormalities. You likely pulled a muscle in your abdominal wall coughing. Please do not take the medication I have prescribed with alcohol. Do not drive, operate heavy machinery or make important decisions while taking this medication. Abdominal (belly) pain can be caused by many things. Your caregiver performed an examination and possibly ordered blood/urine tests and imaging (CT scan, x-rays, ultrasound). Many cases can be observed and treated at home after initial evaluation in the emergency department. Even though you are being discharged home, abdominal pain can be unpredictable. Therefore, you need a repeated exam if your pain does not resolve, returns, or worsens. Most patients with abdominal pain don't have to be admitted to the hospital or have surgery, but serious problems like appendicitis and gallbladder attacks can start out as nonspecific pain. Many abdominal conditions cannot be diagnosed in one visit, so follow-up evaluations are very important. SEEK IMMEDIATE MEDICAL ATTENTION IF: The pain does not go away or becomes severe.  A temperature above 101 develops.  Repeated vomiting occurs (multiple episodes).  The pain becomes localized to portions of the abdomen. The right side could possibly be appendicitis. In an adult, the left lower portion of the abdomen could be colitis or diverticulitis.  Blood is being passed in stools or vomit (bright red or black tarry stools).  Return also if you develop chest pain, difficulty breathing, dizziness or fainting, or become confused, poorly responsive, or inconsolable (young children).  You appear to have an upper respiratory infection (URI). An upper respiratory tract infection, or cold, is a viral infection of the air passages leading to the lungs. It is contagious and can be spread to others, especially during the first 3 or 4 days. It cannot be cured by antibiotics or other medicines. RETURN IMMEDIATELY IF you  develop shortness of breath,severe chest pain, confusion or altered mental status, a new rash, become dizzy, faint, or poorly responsive, or are unable to be cared for at home.  Cough, Adult Coughing is a reflex that clears your throat and your airways. Coughing helps to heal and protect your lungs. It is normal to cough occasionally, but a cough that happens with other symptoms or lasts a long time may be a sign of a condition that needs treatment. A cough may last only 2-3 weeks (acute), or it may last longer than 8 weeks (chronic). CAUSES Coughing is commonly caused by:  Breathing in substances that irritate your lungs.  A viral or bacterial respiratory infection.  Allergies.  Asthma.  Postnasal drip.  Smoking.  Acid backing up from the stomach into the esophagus (gastroesophageal reflux).  Certain medicines.  Chronic lung problems, including COPD (or rarely, lung cancer).  Other medical conditions such as heart failure. HOME CARE INSTRUCTIONS  Pay attention to any changes in your symptoms. Take these actions to help with your discomfort:  Take medicines only as told by your health care provider.  If you were prescribed an antibiotic medicine, take it as told by your health care provider. Do not stop taking the antibiotic even if you start to feel better.  Talk with your health care provider before you take a cough suppressant medicine.  Drink enough fluid to keep your urine clear or pale yellow.  If the air is dry, use a cold steam vaporizer or humidifier in your bedroom or your home to help loosen secretions.  Avoid anything that causes you to cough at work or at home.  If your cough is worse at  night, try sleeping in a semi-upright position.  Avoid cigarette smoke. If you smoke, quit smoking. If you need help quitting, ask your health care provider.  Avoid caffeine.  Avoid alcohol.  Rest as needed. SEEK MEDICAL CARE IF:   You have new symptoms.  You cough  up pus.  Your cough does not get better after 2-3 weeks, or your cough gets worse.  You cannot control your cough with suppressant medicines and you are losing sleep.  You develop pain that is getting worse or pain that is not controlled with pain medicines.  You have a fever.  You have unexplained weight loss.  You have night sweats. SEEK IMMEDIATE MEDICAL CARE IF:  You cough up blood.  You have difficulty breathing.  Your heartbeat is very fast.   This information is not intended to replace advice given to you by your health care provider. Make sure you discuss any questions you have with your health care provider.   Document Released: 09/27/2010 Document Revised: 12/20/2014 Document Reviewed: 06/07/2014 Elsevier Interactive Patient Education [employer].

## 2015-02-05 NOTE — ED Notes (Signed)
COFFEE AND CRACKERS GIVEN

## 2015-02-05 NOTE — ED Notes (Signed)
Patient c/o cough x4 days, states he is producing a large amount of phlegm. Patient states this am when he was coughing he got a sharp pain in his left side. Patient states he has not eaten in 4 days. When asked why he isn't eating patient states "I can't".

## 2015-02-05 NOTE — ED Provider Notes (Signed)
CSN: [id number]     Arrival date & time 02/05/15  0448 History   First MD Initiated Contact with Patient 02/05/15 [phone]     Chief Complaint  Patient presents with  . Cough     (Consider location/radiation/quality/duration/timing/severity/associated sxs/prior Treatment) HPI  Zachary Steele His this 64 year old male with a past medical history of hepatitis, coronary artery and peripheral arterial disease, alcohol dependence, cirrhosis, and tobacco abuse who presents emergency Department with chief complaint of cough and abdominal pain. He is status post open heart surgery for CABG performed in 2010. He complains of 4 days of productive cough of yellow phlegm without fevers. He states that every time he coughs he has pain in his chest. He specifically does not feel it in her sternum, but does feel it in his airway and describes it as consistent with bronchitis. Patient states he has had decreased appetite and has not been eating well. Patient states that yesterday he coughed very hard and felt a sharp pain in his lower abdomen. He states it hurts when he sits up and when he coughs. He denies any urinary symptoms, nausea, vomiting. Last bowel movement was this morning. He denies any history of kidney stones or radiating pain into the testicles. He states the pain is sharp and only hurts with cough and movement. No previous history of abdominal surgeries.   Past Medical History  Diagnosis Date  . Anemia   . Cataract   . Hepatitis C     Has A and B also  . PAD (peripheral artery disease) ([place])     pt reports he has this and has chronic leg "soreness"  . Alcohol dependence ([place])   . Cirrhosis ([place])   . Peripheral vascular disease ([place])     essentially normal circulations with two-vessel  runoff to the feet bilaterally  . Tobacco abuse   . Coronary artery disease due to lipid rich plaque    Past Surgical History  Procedure Laterality Date  . Coronary artery bypass graft  2010    Triple  bypass at [facility]  . Doppler echocardiography  2011  . Nm myoview ltd  2012  . Cardiac catheterization     Family History  Problem Relation Age of Onset  . Stroke Mother    Social History  Substance Use Topics  . Smoking status: Current Some Day Smoker -- 0.20 packs/day for 49 years    Types: Cigarettes  . Smokeless tobacco: Never Used     Comment: Is trying to stop smoking, still has one or two per day most days  . Alcohol Use: 2.0 oz/week    4 Standard drinks or equivalent per week     Comment: patient drink 2 beers daily    Review of Systems  Ten systems reviewed and are negative for acute change, except as noted in the HPI.    Allergies  Review of patient's allergies indicates no known allergies.  Home Medications   Prior to Admission medications   Medication Sig Start Date End Date Taking? Authorizing Provider  Ascorbic Acid (VITAMIN C PO) Take 1 tablet by mouth daily.   Yes Historical Provider, MD  aspirin 81 MG tablet Take 1 tablet (81 mg total) by mouth daily. For: Blood thinner for heart health 09/23/11  Yes Encarnacion Slates, NP  b complex vitamins tablet Take 1 tablet by mouth 2 (two) times daily.   Yes Historical Provider, MD  ibuprofen (ADVIL,MOTRIN) 200 MG tablet Take 400 mg by mouth every  6 (six) hours as needed for pain.   Yes Historical Provider, MD  Multiple Vitamins-Minerals (AIRBORNE) CHEW Chew 1 tablet by mouth daily.   Yes Historical Provider, MD  naproxen sodium (ANAPROX) 220 MG tablet Take 220 mg by mouth 2 (two) times daily as needed (pain).    Yes Historical Provider, MD  guaiFENesin-codeine 100-10 MG/5ML syrup Take 5-10 mLs by mouth every 6 (six) hours as needed for cough. 02/05/15   Margarita Mail, PA-C  naproxen (NAPROSYN) 375 MG tablet Take 1 tablet (375 mg total) by mouth 2 (two) times daily. Patient not taking: Reported on 02/05/2015 07/19/14   Britt Bottom, NP   BP 106/68 mmHg  Pulse 66  Temp(Src) 98.3 F (36.8 C) (Oral)  Resp 15  Ht 5\' 7"   (1.702 m)  Wt 137 lb (62.143 kg)  BMI 21.45 kg/m2  SpO2 98% Physical Exam  Constitutional: He appears well-developed and well-nourished. No distress.  HENT:  Head: Normocephalic and atraumatic.  Eyes: Conjunctivae are normal. No scleral icterus.  Neck: Normal range of motion. Neck supple.  Cardiovascular: Normal rate, regular rhythm and normal heart sounds.   Pulmonary/Chest: Effort normal and breath sounds normal. No respiratory distress. He has no wheezes. He exhibits no tenderness.  Well-healed sternotomy scar  Abdominal: Soft. He exhibits no distension and no mass. There is no tenderness (no tenderness to palpation, no signs of hernia.). There is no guarding.  Musculoskeletal: He exhibits no edema.  Neurological: He is alert.  Skin: Skin is warm and dry. He is not diaphoretic.  Psychiatric: His behavior is normal.  Nursing note and vitals reviewed.   ED Course  Procedures (including critical care time) Labs Review Labs Reviewed - No data to display  Imaging Review No results found. I have personally reviewed and evaluated these images and lab results as part of my medical decision-making.   EKG Interpretation None      MDM   Final diagnoses:  Cough  Abdominal muscle strain, sequela    9:21 AM BP 106/68 mmHg  Pulse 66  Temp(Src) 98.3 F (36.8 C) (Oral)  Resp 15  Ht 5\' 7"  (1.702 m)  Wt 137 lb (62.143 kg)  BMI 21.45 kg/m2  SpO2 98% Patient here with complaint of cough and lower abdominal pain. The patient's chest x-ray unremarkable for acute abnormalities, his urine also shows ketones at 40 mg/dL, high urobilinogen, without signs of infection. Labs are otherwise currently pending  9:21 AM BP 106/68 mmHg  Pulse 66  Temp(Src) 98.3 F (36.8 C) (Oral)  Resp 15  Ht 5\' 7"  (1.702 m)  Wt 137 lb (62.143 kg)  BMI 21.45 kg/m2  SpO2 98% Patient CMP shows transaminitis which appears  Chronic. No leukocytosis. No fever.  +target cells. Thrombocytopenia appears  baseline. Patient likely strained an abdominal wall muscle coughing.  Patient is nontoxic, nonseptic appearing, in no apparent distress.  Patient's pain and other symptoms adequately managed in emergency department.  Fluid bolus given.  Labs, imaging and vitals reviewed.  Patient does not meet the SIRS or Sepsis criteria.  On repeat exam patient does not have a surgical abdomin and there are no peritoneal signs.  No indication of appendicitis, bowel obstruction, bowel perforation, cholecystitis, diverticulitis  Patient discharged home with symptomatic treatment and given strict instructions for follow-up with their primary care physician.  I have also discussed reasons to return immediately to the ER.  Patient expresses understanding and agrees with plan.     Margarita Mail, PA-C 02/05/15 9480  Loma Sousa  Gwinda Passe, MD 02/06/15 [phone]

## 2015-02-05 NOTE — ED Notes (Signed)
LAB AT BS ATTEMPTING TO COLLECT

## 2015-02-05 NOTE — ED Notes (Signed)
Pt from home states he has had a cough x 4 days with yellow phlegm. Pt states he has a hx of triple bypass sx and sometimes when he coughs the pain he feels when he coughs reminds him of surgical pain from that. However, this area is not currently hurting. The pt also states that sometimes when he coughs he gets a pain in his lower right abdomen. His lower right abdomen is currently hurting him.  Pt states that he has not had an appetite since he has been not feeling well.

## 2015-08-14 ENCOUNTER — Encounter: Payer: Self-pay | Admitting: Gastroenterology

## 2015-09-11 ENCOUNTER — Encounter: Payer: Self-pay | Admitting: Gastroenterology

## 2015-10-24 ENCOUNTER — Ambulatory Visit (AMBULATORY_SURGERY_CENTER): Payer: Self-pay | Admitting: *Deleted

## 2015-10-24 VITALS — Ht 67.0 in | Wt 130.0 lb

## 2015-10-24 DIAGNOSIS — Z86010 Personal history of colonic polyps: Secondary | ICD-10-CM

## 2015-10-24 MED ORDER — NA SULFATE-K SULFATE-MG SULF 17.5-3.13-1.6 GM/177ML PO SOLN: 1.0000 | Freq: Once | ORAL | Status: DC

## 2015-10-24 NOTE — Progress Notes (Signed)
Denies allergies to eggs or soy products. Denies complications with sedation or anesthesia. Denies O2 use. Denies use of diet or weight loss medications.  Emmi instructions not given for colonoscopy. Pt refused emmi video.

## 2015-10-25 ENCOUNTER — Encounter: Payer: Self-pay | Admitting: Gastroenterology

## 2015-11-07 ENCOUNTER — Encounter: Payer: Self-pay | Admitting: Gastroenterology

## 2015-11-07 ENCOUNTER — Ambulatory Visit (AMBULATORY_SURGERY_CENTER): Payer: Medicare Other | Admitting: Gastroenterology

## 2015-11-07 VITALS — BP 109/63 | HR 62 | Temp 98.2°F | Resp 11 | Ht 67.0 in | Wt 130.0 lb

## 2015-11-07 DIAGNOSIS — Z86010 Personal history of colonic polyps: Secondary | ICD-10-CM

## 2015-11-07 NOTE — Patient Instructions (Signed)
YOU HAD AN ENDOSCOPIC PROCEDURE TODAY AT THE [facility]:   Refer to the procedure report that was given to you for any specific questions about what was found during the examination.  If the procedure report does not answer your questions, please call your gastroenterologist to clarify.  If you requested that your care partner not be given the details of your procedure findings, then the procedure report has been included in a sealed envelope for you to review at your convenience later.  YOU SHOULD EXPECT: Some feelings of bloating in the abdomen. Passage of more gas than usual.  Walking can help get rid of the air that was put into your GI tract during the procedure and reduce the bloating. If you had a lower endoscopy (such as a colonoscopy or flexible sigmoidoscopy) you may notice spotting of blood in your stool or on the toilet paper. If you underwent a bowel prep for your procedure, you may not have a normal bowel movement for a few days.  Please Note:  You might notice some irritation and congestion in your nose or some drainage.  This is from the oxygen used during your procedure.  There is no need for concern and it should clear up in a day or so.  SYMPTOMS TO REPORT IMMEDIATELY:   Following lower endoscopy (colonoscopy or flexible sigmoidoscopy):  Excessive amounts of blood in the stool  Significant tenderness or worsening of abdominal pains  Swelling of the abdomen that is new, acute  Fever of 100F or higher    For urgent or emergent issues, a gastroenterologist can be reached at any hour by calling [phone].   DIET: Your first meal following the procedure should be a small meal and then it is ok to progress to your normal diet. Heavy or fried foods are harder to digest and may make you feel nauseous or bloated.  Likewise, meals heavy in dairy and vegetables can increase bloating.  Drink plenty of fluids but you should avoid alcoholic beverages for 24  hours.  ACTIVITY:  You should plan to take it easy for the rest of today and you should NOT DRIVE or use heavy machinery until tomorrow (because of the sedation medicines used during the test).    FOLLOW UP: Our staff will call the number listed on your records the next business day following your procedure to check on you and address any questions or concerns that you may have regarding the information given to you following your procedure. If we do not reach you, we will leave a message.  However, if you are feeling well and you are not experiencing any problems, there is no need to return our call.  We will assume that you have returned to your regular daily activities without incident.  If any biopsies were taken you will be contacted by phone or by letter within the next 1-3 weeks.  Please call us at [phone] if you have not heard about the biopsies in 3 weeks.    SIGNATURES/CONFIDENTIALITY: You and/or your care partner have signed paperwork which will be entered into your electronic medical record.  These signatures attest to the fact that that the information above on your After Visit Summary has been reviewed and is understood.  Full responsibility of the confidentiality of this discharge information lies with you and/or your care-partner.   Resume medications. Information given on diverticulosis and high fiber diet. 

## 2015-11-07 NOTE — Progress Notes (Signed)
Report to PACU, RN, vss, BBS= Clear.  

## 2015-11-07 NOTE — Progress Notes (Signed)
Pt. Soft and expelling air.

## 2015-11-07 NOTE — Op Note (Signed)
[facility] Patient Name: Zachary Steele Procedure Date: 11/07/2015 2:27 PM MRN: [id number] Endoscopist: Milus Banister , MD Age: 65 Referring MD:  Date of Birth: Jul 26, 1950 Gender: Male Account #: [id number] Procedure:                Colonoscopy Indications:              High risk colon cancer surveillance: Personal                            history of colonic polyps; Colonoscopy 2012 Dr.                            Ardis Hughs, one subCM adenoma removed Medicines:                Monitored Anesthesia Care Procedure:                Pre-Anesthesia Assessment:                           - Prior to the procedure, a History and Physical                            was performed, and patient medications and                            allergies were reviewed. The patient's tolerance of                            previous anesthesia was also reviewed. The risks                            and benefits of the procedure and the sedation                            options and risks were discussed with the patient.                            All questions were answered, and informed consent                            was obtained. Prior Anticoagulants: The patient has                            taken no previous anticoagulant or antiplatelet                            agents. ASA Grade Assessment: III - A patient with                            severe systemic disease. After reviewing the risks                            and benefits, the patient was deemed in  satisfactory condition to undergo the procedure.                           After obtaining informed consent, the colonoscope                            was passed under direct vision. Throughout the                            procedure, the patient's blood pressure, pulse, and                            oxygen saturations were monitored continuously. The                            Model PCF-[id number])  scope was introduced                            through the anus and advanced to the the cecum,                            identified by appendiceal orifice and ileocecal                            valve. The colonoscopy was performed without                            difficulty. The patient tolerated the procedure                            well. The quality of the bowel preparation was                            good. The ileocecal valve, appendiceal orifice, and                            rectum were photographed. Scope In: 2:34:12 PM Scope Out: 2:42:37 PM Scope Withdrawal Time: 0 hours 5 minutes 54 seconds  Total Procedure Duration: 0 hours 8 minutes 25 seconds  Findings:                 Multiple small and large-mouthed diverticula were                            found in the entire colon.                           The exam was otherwise without abnormality on                            direct and retroflexion views. Complications:            No immediate complications. Estimated blood loss:                            None.  Estimated Blood Loss:     Estimated blood loss: none. Impression:               - Diverticulosis in the entire examined colon.                           - The examination was otherwise normal on direct                            and retroflexion views.                           - No polyps or cancers Recommendation:           - Patient has a contact number available for                            emergencies. The signs and symptoms of potential                            delayed complications were discussed with the                            patient. Return to normal activities tomorrow.                            Written discharge instructions were provided to the                            patient.                           - Resume previous diet.                           - Continue present medications.                           - Repeat colonoscopy in 10  years for screening                            purposes. Milus Banister, MD 11/07/2015 2:46:11 PM This report has been signed electronically.

## 2015-11-08 ENCOUNTER — Telehealth: Payer: Self-pay | Admitting: *Deleted

## 2015-11-08 NOTE — Telephone Encounter (Signed)
  Follow up Call-  Call back number 11/07/2015  Post procedure Call Back phone  # [phone]  Permission to leave phone message Yes  Some recent data might be hidden     Patient questions:  Do you have a fever, pain , or abdominal swelling? No. Pain Score  0 *  Have you tolerated food without any problems? Yes.    Have you been able to return to your normal activities? Yes.    Do you have any questions about your discharge instructions: Diet   No. Medications  No. Follow up visit  No.  Do you have questions or concerns about your Care? No.  Actions: * If pain score is 4 or above: No action needed, pain <4.

## 2015-11-09 ENCOUNTER — Encounter: Payer: Self-pay | Admitting: Cardiology

## 2015-11-09 ENCOUNTER — Ambulatory Visit (INDEPENDENT_AMBULATORY_CARE_PROVIDER_SITE_OTHER): Payer: Medicare Other | Admitting: Cardiology

## 2015-11-09 VITALS — BP 92/59 | HR 69 | Ht 67.0 in | Wt 134.8 lb

## 2015-11-09 DIAGNOSIS — Z720 Tobacco use: Secondary | ICD-10-CM

## 2015-11-09 DIAGNOSIS — I2510 Atherosclerotic heart disease of native coronary artery without angina pectoris: Secondary | ICD-10-CM

## 2015-11-09 DIAGNOSIS — I739 Peripheral vascular disease, unspecified: Secondary | ICD-10-CM | POA: Diagnosis not present

## 2015-11-09 DIAGNOSIS — Z951 Presence of aortocoronary bypass graft: Secondary | ICD-10-CM | POA: Diagnosis not present

## 2015-11-09 DIAGNOSIS — I2583 Coronary atherosclerosis due to lipid rich plaque: Secondary | ICD-10-CM

## 2015-11-09 DIAGNOSIS — K7030 Alcoholic cirrhosis of liver without ascites: Secondary | ICD-10-CM

## 2015-11-09 DIAGNOSIS — F10288 Alcohol dependence with other alcohol-induced disorder: Secondary | ICD-10-CM | POA: Diagnosis not present

## 2015-11-09 DIAGNOSIS — F17200 Nicotine dependence, unspecified, uncomplicated: Secondary | ICD-10-CM

## 2015-11-09 NOTE — Assessment & Plan Note (Signed)
Some leg pain- mixed restless leg syndrome and possible claudication Mildly abnormal dopplers 2016

## 2015-11-09 NOTE — Assessment & Plan Note (Signed)
Less than 1/2 ppd

## 2015-11-09 NOTE — Assessment & Plan Note (Signed)
He has ETOH related cirrhosis, not on statin Rx

## 2015-11-09 NOTE — Assessment & Plan Note (Signed)
CABG x 3 with LIMA-LAD, SVG-CFX, SVG-RCA 2010 Myoview low risk 2012, normal LVF by echo 2011

## 2015-11-09 NOTE — Patient Instructions (Signed)
Your physician has requested that you have a lower  extremity arterial duplex at your convenience  This test is an ultrasound of the arteries in the legs or arms. It looks at arterial blood flow in the legs and arms. Allow one hour for Lower and Upper Arterial scans. There are no restrictions or special instructions.   Your physician recommends that you schedule a follow-up appointment with Dr Gwenlyn Found after doppler.

## 2015-11-09 NOTE — Progress Notes (Signed)
11/09/2015 Zachary Steele   November 17, 1950  [id number]  Primary Physician Zachary Peers, MD Primary Cardiologist: Dr Zachary Steele  HPI:  65 y/o AA male with a history of ETOH abuse, cirrhosis, and CAD. He had CABG in 2010. Myoview was low risk in 2012. He saw Dr Zachary Steele in 2016 for leg pain. LEA doppler mildly abnormal June 2016. Dr Zachary Steele did not feel his symptoms were secondary to claudication. He is in the office today for routine f/u. He denies chest pain. He is smoking a couple of cigarettes a day. He says he hasn't had any ETOH in a week. He complains of his legs bothering him at night and he has to "move my feet around". He also says he has calf pain when walking.    Current Outpatient Prescriptions  Medication Sig Dispense Refill  . Ascorbic Acid (VITAMIN C PO) Take 1 tablet by mouth daily.    Marland Kitchen aspirin 81 MG tablet Take 1 tablet (81 mg total) by mouth daily. For: Blood thinner for heart health 30 tablet   . b complex vitamins tablet Take 1 tablet by mouth 2 (two) times daily.    Marland Kitchen ibuprofen (ADVIL,MOTRIN) 200 MG tablet Take 400 mg by mouth every 6 (six) hours as needed for pain.    . Multiple Vitamins-Minerals (AIRBORNE) CHEW Chew 1 tablet by mouth daily.     No current facility-administered medications for this visit.     No Known Allergies  Social History   Social History  . Marital status: Married    Spouse name: N/A  . Number of children: N/A  . Years of education: N/A   Occupational History  . Not on file.   Social History Main Topics  . Smoking status: Current Some Day Smoker    Packs/day: 0.20    Years: 49.00    Types: Cigarettes  . Smokeless tobacco: Never Used     Comment: Is trying to stop smoking, still has one or two per day most days  . Alcohol use 8.4 oz/week    14 Cans of beer per week     Comment: patient drink 2 beers daily  . Drug use:     Types: Marijuana     Comment: occ  . Sexual activity: Yes   Other Topics Concern  . Not on file    Social History Narrative  . No narrative on file     Review of Systems: General: negative for chills, fever, night sweats or weight changes.  Cardiovascular: negative for chest pain, dyspnea on exertion, edema, orthopnea, palpitations, paroxysmal nocturnal dyspnea or shortness of breath Dermatological: negative for rash Respiratory: negative for cough or wheezing Urologic: negative for hematuria Abdominal: negative for nausea, vomiting, diarrhea, bright red blood per rectum, melena, or hematemesis Neurologic: negative for visual changes, syncope, or dizziness All other systems reviewed and are otherwise negative except as noted above.    Height 5\' 7"  (1.702 m), weight 129 lb 12.8 oz (58.9 kg).  General appearance: alert, cooperative, no distress and well groomed Neck: no carotid bruit and no JVD Lungs: clear to auscultation bilaterally Heart: regular rate and rhythm Extremities: no edema, decreased pulses both DP Skin: Skin color, texture, turgor normal. No rashes or lesions Neurologic: Grossly normal  EKG NSR  ASSESSMENT AND PLAN:   Hx of CABG CABG x 3 with LIMA-LAD, SVG-CFX, SVG-RCA 2010 Myoview low risk 2012, normal LVF by echo 2011  Alcohol addiction (Zachary Steele) Pt says he hasn't drank in a week  Cirrhosis He has ETOH related cirrhosis, not on statin Rx  Peripheral arterial disease (Zachary Steele) Some leg pain- mixed restless leg syndrome and possible claudication Mildly abnormal dopplers 2016  Smoker Less than 1/2 ppd   PLAN  I think Zachary Steele has restless leg syndrome but he may also have some claudication symptoms. I suggest we repeat his LEA dopplers and f/u with Dr Zachary Steele. He is not on a beta blocker (systolic B/P 92) and he is not on a statin (cirrhosis).   Zachary Ransom PA-C 11/09/2015 10:04 AM

## 2015-11-09 NOTE — Assessment & Plan Note (Signed)
Pt says he hasn't drank in a week

## 2015-11-15 NOTE — Addendum Note (Signed)
Addended by: Cristopher Estimable on: 11/15/2015 11:31 AM   Modules accepted: Orders

## 2015-11-20 ENCOUNTER — Other Ambulatory Visit: Payer: Self-pay | Admitting: Cardiovascular Disease

## 2015-11-20 ENCOUNTER — Other Ambulatory Visit: Payer: Self-pay | Admitting: Cardiology

## 2015-11-20 DIAGNOSIS — I2583 Coronary atherosclerosis due to lipid rich plaque: Secondary | ICD-10-CM

## 2015-11-20 DIAGNOSIS — I739 Peripheral vascular disease, unspecified: Secondary | ICD-10-CM

## 2015-11-20 DIAGNOSIS — I2510 Atherosclerotic heart disease of native coronary artery without angina pectoris: Secondary | ICD-10-CM

## 2015-12-04 ENCOUNTER — Inpatient Hospital Stay (HOSPITAL_COMMUNITY): Admission: RE | Admit: 2015-12-04 | Payer: Medicare Other | Source: Ambulatory Visit

## 2015-12-21 ENCOUNTER — Ambulatory Visit: Payer: Medicare Other | Admitting: Cardiovascular Disease

## 2016-04-23 ENCOUNTER — Other Ambulatory Visit: Payer: Self-pay | Admitting: Family

## 2016-04-23 DIAGNOSIS — R0989 Other specified symptoms and signs involving the circulatory and respiratory systems: Secondary | ICD-10-CM

## 2016-04-23 DIAGNOSIS — Z136 Encounter for screening for cardiovascular disorders: Secondary | ICD-10-CM

## 2016-04-29 ENCOUNTER — Other Ambulatory Visit: Payer: Self-pay

## 2016-05-01 ENCOUNTER — Other Ambulatory Visit: Payer: Self-pay

## 2016-05-01 ENCOUNTER — Ambulatory Visit: Payer: Self-pay

## 2016-05-12 ENCOUNTER — Ambulatory Visit
Admission: RE | Admit: 2016-05-12 | Discharge: 2016-05-12 | Disposition: A | Discharge status: Home / Self Care | Payer: Medicare HMO | Source: Ambulatory Visit | Attending: Family | Admitting: Family

## 2016-05-12 DIAGNOSIS — R0989 Other specified symptoms and signs involving the circulatory and respiratory systems: Secondary | ICD-10-CM

## 2016-05-12 DIAGNOSIS — Z136 Encounter for screening for cardiovascular disorders: Secondary | ICD-10-CM

## 2016-05-23 ENCOUNTER — Other Ambulatory Visit: Payer: Self-pay

## 2016-05-23 DIAGNOSIS — I70219 Atherosclerosis of native arteries of extremities with intermittent claudication, unspecified extremity: Secondary | ICD-10-CM

## 2016-07-04 ENCOUNTER — Encounter: Payer: Self-pay | Admitting: Vascular Surgery

## 2016-07-10 ENCOUNTER — Ambulatory Visit (HOSPITAL_COMMUNITY)
Admission: RE | Admit: 2016-07-10 | Discharge: 2016-07-10 | Disposition: A | Discharge status: Home / Self Care | Payer: Medicare HMO | Source: Ambulatory Visit | Attending: Vascular Surgery | Admitting: Vascular Surgery

## 2016-07-10 ENCOUNTER — Ambulatory Visit (INDEPENDENT_AMBULATORY_CARE_PROVIDER_SITE_OTHER): Payer: Medicare HMO | Admitting: Vascular Surgery

## 2016-07-10 ENCOUNTER — Encounter: Payer: Self-pay | Admitting: Vascular Surgery

## 2016-07-10 VITALS — BP 136/80 | HR 58 | Temp 97.0°F | Resp 16 | Ht 67.0 in | Wt 141.0 lb

## 2016-07-10 DIAGNOSIS — I739 Peripheral vascular disease, unspecified: Secondary | ICD-10-CM

## 2016-07-10 DIAGNOSIS — I70219 Atherosclerosis of native arteries of extremities with intermittent claudication, unspecified extremity: Secondary | ICD-10-CM | POA: Diagnosis present

## 2016-07-10 LAB — VAS US LOWER EXTREMITY ARTERIAL DUPLEX
LEFT PERO DIST SYS: -22 cm/s
Left ant tibial distal sys: 92 cm/s
Left super femoral dist sys PSV: -50 cm/s
Left super femoral mid sys PSV: -94 cm/s
Left super femoral prox sys PSV: -86 cm/s
RIGHT ANT DIST TIBAL SYS PSV: -4 cm/s
RIGHT POST TIB DIST SYS: -92 cm/s
Right peroneal sys PSV: 41 cm/s
Right super femoral dist sys PSV: -84 cm/s
Right super femoral mid sys PSV: -80 cm/s
Right super femoral prox sys PSV: -76 cm/s

## 2016-07-10 NOTE — Progress Notes (Signed)
Referring Physician: Oleta Mouse, NP  Patient name: Zachary Steele MRN: [id number] DOB: 1950/12/21 Sex: male  REASON FOR CONSULT: Leg pain  HPI: Zachary Steele is a 66 y.o. male with a chronic several year history of pain in his legs after being on his feet all day long. He states that the calf aches. He occasionally takes ibuprofen for this. He also elevate his legs when he can. 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 He does admit to smoking although probably less than half a pack per day. Greater than 3 minutes today spell regarding smoking cessation counseling. He also has a history of current alcohol use and known cirrhosis. He currently is not interested in stopping drinking. He does state that he does not rank heavily and has may be four alcoholic beverages per week.  He does not really describe claudication symptoms. He states his legs do hurt after being on them for long periods of time but this does not seem to be related to any particular walking distance. He denies rest pain. He has no history of nonhealing wounds. He did have an ulcer on the lateral aspect of his left leg that took a long time to heal but has not had problems with this recently. He did have harvesting of the greater saphenous vein of both legs in the past for coronary artery bypass grafting. He is on aspirin. He is not on a statin due to his liver dysfunction. He denies any prior episodes of ascites or hematemesis.  Past Medical History:  Diagnosis Date  . Alcohol dependence ([place])   . Anemia   . Arthritis   . Cataract   . Cirrhosis ([place])   . Coronary artery disease due to lipid rich plaque   . Depression   . Hepatitis C    Has A and B also  . PAD (peripheral artery disease) ([place])    pt reports he has this and has chronic leg "soreness"  . Peripheral vascular disease ([place])    essentially normal circulations with two-vessel  runoff to the feet bilaterally  . Tobacco abuse    Past Surgical  History:  Procedure Laterality Date  . CARDIAC CATHETERIZATION    . CORONARY ARTERY BYPASS GRAFT  2010   Triple bypass at [facility]  . DOPPLER ECHOCARDIOGRAPHY  2011  . NM MYOVIEW LTD  2012    Family History  Problem Relation Age of Onset  . Stroke Mother   . Colon cancer Neg Hx     SOCIAL HISTORY: Social History   Social History  . Marital status: Married    Spouse name: N/A  . Number of children: N/A  . Years of education: N/A   Occupational History  . Not on file.   Social History Main Topics  . Smoking status: Current Some Day Smoker    Packs/day: 0.20    Years: 49.00    Types: Cigarettes  . Smokeless tobacco: Never Used     Comment: Is trying to stop smoking, still has one or two per day most days  . Alcohol use 8.4 oz/week    14 Cans of beer per week     Comment: patient drink 2 beers daily  . Drug use: Yes    Types: Marijuana     Comment: occ  . Sexual activity: Yes   Other Topics Concern  . Not on file   Social History Narrative  . No narrative on file  Allergies  Allergen Reactions  . Statins     Alcoholic cirrhosis, cardiology has declined to start statin    Current Outpatient Prescriptions  Medication Sig Dispense Refill  . aspirin 81 MG tablet Take 1 tablet (81 mg total) by mouth daily. For: Blood thinner for heart health 30 tablet   . ibuprofen (ADVIL,MOTRIN) 200 MG tablet Take 400 mg by mouth every 6 (six) hours as needed for pain.    . Multiple Vitamins-Minerals (AIRBORNE) CHEW Chew 1 tablet by mouth daily.    . Ascorbic Acid (VITAMIN C PO) Take 1 tablet by mouth daily.    Marland Kitchen b complex vitamins tablet Take 1 tablet by mouth 2 (two) times daily.     No current facility-administered medications for this visit.     ROS:   General:  No weight loss, Fever, chills  HEENT: No recent headaches, no nasal bleeding, no visual changes, no sore throat  Neurologic: No dizziness, blackouts, seizures. No recent symptoms of stroke or mini- stroke. No  recent episodes of slurred speech, or temporary blindness.  Cardiac: No recent episodes of chest pain/pressure, no shortness of breath at rest.  No shortness of breath with exertion.  Denies history of atrial fibrillation or irregular heartbeat  Vascular: No history of rest pain in feet.  No history of claudication.  No history of non-healing ulcer, No history of DVT   Pulmonary: No home oxygen, no productive cough, no hemoptysis,  No asthma or wheezing  Musculoskeletal:  [ ]  Arthritis, [ ]  Low back pain,  [ ]  Joint pain  Hematologic:No history of hypercoagulable state.  No history of easy bleeding.  No history of anemia  Gastrointestinal: No hematochezia or melena,  No gastroesophageal reflux, no trouble swallowing  Urinary: [ ]  chronic Kidney disease, [ ]  on HD - [ ]  MWF or [ ]  TTHS, [ ]  Burning with urination, [ ]  Frequent urination, [ ]  Difficulty urinating;   Skin: No rashes  Psychological: No history of anxiety,  No history of depression   Physical Examination  Vitals:   07/10/16 1057  BP: 136/80  Pulse: (!) 58  Resp: 16  Temp: 97 F (36.1 C)  TempSrc: Oral  SpO2: 99%  Weight: 141 lb (64 kg)  Height: 5\' 7"  (1.702 m)    Body mass index is 22.08 kg/m.  General:  Alert and oriented, no acute distress HEENT: Normal Neck: No bruit or JVD Pulmonary: Clear to auscultation bilaterally Cardiac: Regular Rate and Rhythm without murmur Abdomen: Soft, non-tender, non-distended, no mass, palpable aortic pulsation Skin: No rash, hypopigmented area left lateral calf, no ulcer Extremity Pulses:  2+ radial, brachial, femoral, 1+ left dorsalis pedis, absent right dorsalis pedis and absent posterior tibial pulses bilaterally Musculoskeletal: No deformity or edema  Neurologic: Upper and lower extremity motor 5/5 and symmetric  DATA:  Patient had a arterial duplex exam of both legs today. This showed triphasic waveforms all the way down to the popliteal artery bilaterally. He did  have evidence of atherosclerosis in the tibial vessels primarily. However all 3 tibial vessels were patent on the right leg. On the left leg the anterior tibial and peroneal arteries were patent with occlusion of the posterior tibial artery.  ASSESSMENT:  Evidence of peripheral arterial disease but symptoms more consistent with elements of venous hypertension with heaviness and aching of the leg. Duplex ultrasound did not suggest any significant arterial flow-limiting stenosis that I would consider for an intervention.   PLAN:  The patient was given  a prescription today for lower extremity compression stockings to see if this improves his symptoms. He will follow-up in one year with our nurse practitioner with a repeat arterial duplex exam. He will try to quit smoking.  Other chronic medical problems including coronary artery disease cirrhosis tobacco abuse were dressed and are all stable.   Ruta Hinds, MD [facility] Office: [phone] Pager: [phone]

## 2016-07-10 NOTE — Addendum Note (Signed)
Addended by: Lianne Cure A on: 07/10/2016 01:19 PM   Modules accepted: Orders

## 2017-04-03 ENCOUNTER — Encounter (HOSPITAL_COMMUNITY): Payer: Self-pay | Admitting: Emergency Medicine

## 2017-04-03 ENCOUNTER — Emergency Department (HOSPITAL_COMMUNITY)
Admission: EM | Admit: 2017-04-03 | Discharge: 2017-04-04 | Disposition: A | Discharge status: Home / Self Care | Payer: Medicare HMO | Attending: Emergency Medicine | Admitting: Emergency Medicine

## 2017-04-03 ENCOUNTER — Other Ambulatory Visit: Payer: Self-pay

## 2017-04-03 ENCOUNTER — Emergency Department (HOSPITAL_COMMUNITY): Payer: Medicare HMO

## 2017-04-03 DIAGNOSIS — R0789 Other chest pain: Secondary | ICD-10-CM | POA: Diagnosis not present

## 2017-04-03 DIAGNOSIS — I259 Chronic ischemic heart disease, unspecified: Secondary | ICD-10-CM | POA: Diagnosis not present

## 2017-04-03 DIAGNOSIS — F17210 Nicotine dependence, cigarettes, uncomplicated: Secondary | ICD-10-CM | POA: Insufficient documentation

## 2017-04-03 DIAGNOSIS — E876 Hypokalemia: Secondary | ICD-10-CM | POA: Diagnosis not present

## 2017-04-03 DIAGNOSIS — Z720 Tobacco use: Secondary | ICD-10-CM

## 2017-04-03 DIAGNOSIS — R079 Chest pain, unspecified: Secondary | ICD-10-CM

## 2017-04-03 LAB — CBC WITH DIFFERENTIAL/PLATELET
Basophils Absolute: 0 10*3/uL (ref 0.0–0.1)
Basophils Relative: 0 %
Eosinophils Absolute: 0 10*3/uL (ref 0.0–0.7)
Eosinophils Relative: 1 %
HCT: 34.6 % — ABNORMAL LOW (ref 39.0–52.0)
Hemoglobin: 12.7 g/dL — ABNORMAL LOW (ref 13.0–17.0)
Lymphocytes Relative: 53 %
Lymphs Abs: 1.5 10*3/uL (ref 0.7–4.0)
MCH: 36.2 pg — ABNORMAL HIGH (ref 26.0–34.0)
MCHC: 36.7 g/dL — ABNORMAL HIGH (ref 30.0–36.0)
MCV: 98.6 fL (ref 78.0–100.0)
Monocytes Absolute: 0.2 10*3/uL (ref 0.1–1.0)
Monocytes Relative: 7 %
Neutro Abs: 1.1 10*3/uL — ABNORMAL LOW (ref 1.7–7.7)
Neutrophils Relative %: 39 %
Platelets: 47 10*3/uL — ABNORMAL LOW (ref 150–400)
RBC: 3.51 MIL/uL — ABNORMAL LOW (ref 4.22–5.81)
RDW: 14.4 % (ref 11.5–15.5)
WBC: 2.9 10*3/uL — ABNORMAL LOW (ref 4.0–10.5)

## 2017-04-03 LAB — BASIC METABOLIC PANEL
Anion gap: 13 (ref 5–15)
BUN: <5 mg/dL — ABNORMAL LOW (ref 6–20)
CO2: 19 mmol/L — ABNORMAL LOW (ref 22–32)
Calcium: 8.1 mg/dL — ABNORMAL LOW (ref 8.9–10.3)
Chloride: 105 mmol/L (ref 101–111)
Creatinine, Ser: 0.69 mg/dL (ref 0.61–1.24)
GFR calc Af Amer: >60 mL/min (ref >60)
GFR calc non Af Amer: >60 mL/min (ref >60)
Glucose, Bld: 96 mg/dL (ref 65–99)
Potassium: 3.5 mmol/L (ref 3.5–5.1)
Sodium: 137 mmol/L (ref 135–145)

## 2017-04-03 LAB — I-STAT TROPONIN, ED: Troponin i, poc: 0 ng/mL (ref 0.00–0.08)

## 2017-04-03 MED ORDER — SODIUM CHLORIDE 0.9 % IV BOLUS (SEPSIS)
500.0000 mL | Freq: Once | INTRAVENOUS | Status: AC
  Administered 2017-04-03: 500 mL via INTRAVENOUS

## 2017-04-03 MED ORDER — ALBUTEROL SULFATE (2.5 MG/3ML) 0.083% IN NEBU
5.0000 mg | INHALATION_SOLUTION | Freq: Once | RESPIRATORY_TRACT | Status: AC
  Administered 2017-04-03: 5 mg via RESPIRATORY_TRACT
  Filled 2017-04-03: qty 6

## 2017-04-03 MED ORDER — PREDNISONE 20 MG PO TABS
60.0000 mg | ORAL_TABLET | Freq: Once | ORAL | Status: AC
  Administered 2017-04-03: 60 mg via ORAL
  Filled 2017-04-03: qty 3

## 2017-04-03 MED ORDER — DOXYCYCLINE HYCLATE 100 MG PO TABS
100.0000 mg | ORAL_TABLET | Freq: Once | ORAL | Status: AC
Start: 2017-04-03 — End: 2017-04-03
  Administered 2017-04-03: 100 mg via ORAL
  Filled 2017-04-03: qty 1

## 2017-04-03 MED ORDER — ONDANSETRON HCL 4 MG/2ML IJ SOLN
4.0000 mg | Freq: Once | INTRAMUSCULAR | Status: AC
  Administered 2017-04-03: 4 mg via INTRAVENOUS
  Filled 2017-04-03: qty 2

## 2017-04-03 MED ORDER — POTASSIUM CHLORIDE CRYS ER 20 MEQ PO TBCR
40.0000 meq | EXTENDED_RELEASE_TABLET | Freq: Once | ORAL | Status: AC
  Administered 2017-04-03: 40 meq via ORAL
  Filled 2017-04-03: qty 2

## 2017-04-03 NOTE — ED Provider Notes (Signed)
[facility] EMERGENCY DEPARTMENT Provider Note   CSN: [id number] Arrival date & time: 04/03/17  1530     History   Chief Complaint Chief Complaint  Patient presents with  . Chest Pain    HPI Zachary Steele is a 66 y.o. male.  He presents for evaluation of multiple complaints including intermittent periods of chest pain, cough with sputum production, heart fluttering, decreased oral appetite, occasional vomiting, and intermittent shortness of breath.  He also has intermittent numbness in fingers of both hands, which occurs without provocation.  He has a history of coronary disease, with cardiac bypass, but does not take any medications with the exception of aspirin.  He does not see a cardiologist at this time.  He states he has a primary care provider.  He continues to smoke cigarettes.  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  He is here with his wife.  There are no other known modifying factors.  HPI  Past Medical History:  Diagnosis Date  . Alcohol dependence ([place])   . Anemia   . Arthritis   . Cataract   . Cirrhosis ([place])   . Coronary artery disease due to lipid rich plaque   . Depression   . Hepatitis C    Has A and B also  . PAD (peripheral artery disease) ([place])    pt reports he has this and has chronic leg "soreness"  . Peripheral vascular disease ([place])    essentially normal circulations with two-vessel  runoff to the feet bilaterally  . Tobacco abuse     Patient Active Problem List   Diagnosis Date Noted  . Smoker 11/09/2015  . Hx of CABG 09/13/2014  . Peripheral arterial disease ([place]) 09/13/2014  . Cirrhosis ([place])   . Alcohol addiction ([place]) 09/18/2011  . Abnormal liver enzymes 09/18/2011  . Abnormal platelets ([place]) 09/18/2011  . Pancytopenia ([place]) 09/18/2011    Class: Chronic  . UTI (urinary tract infection) 09/18/2011    Class: Acute  . Substance induced mood  disorder ([place]) 09/18/2011    Past Surgical History:  Procedure Laterality Date  . CARDIAC CATHETERIZATION    . CORONARY ARTERY BYPASS GRAFT  2010   Tri[facility] bypass at [facility]  . DOPPLER ECHOCARDIOGRAPHY  2011  . NM MYOVIEW LTD  2012       Home Medications    Prior to Admission medications   Medication Sig Start Date End Date Taking? Authorizing Provider  aspirin 81 MG tablet Take 1 tablet (81 mg total) by mouth daily. For: Blood thinner for heart health 09/23/11  Yes Lindell Spar I, NP  ibuprofen (ADVIL,MOTRIN) 200 MG tablet Take 400-600 mg by mouth every 6 (six) hours as needed for headache (pain).    Yes [provider]  Multiple Vitamin (MULTIVITAMIN WITH MINERALS) TABS tablet Take 1 tablet by mouth daily.   Yes [provider]  potassium chloride (K-DUR) 10 MEQ tablet Take 1 tablet (10 mEq total) by mouth 2 (two) times daily. 04/04/17   Daleen Bo, MD    Family History Family History  Problem Relation Age of Onset  . Stroke Mother   . Colon cancer Neg Hx     Social History Social History   Tobacco Use  . Smoking status: Current Some Day Smoker    Packs/day: 0.20    Years: 49.00    Pack years: 9.80    Types: Cigarettes  .  Smokeless tobacco: Never Used  . Tobacco comment: Is trying to stop smoking, still has one or two per day most days  Substance Use Topics  . Alcohol use: Yes    Alcohol/week: 8.4 oz    Types: 14 Cans of beer per week    Comment: patient drink 2 beers daily  . Drug use: Yes    Types: Marijuana    Comment: occ     Allergies   Statins   Review of Systems Review of Systems  All other systems reviewed and are negative.    Physical Exam Updated Vital Signs BP 114/79   Pulse 96   Temp 98 F (36.7 C) (Oral)   Resp (!) 22   Ht 5\' 7"  (1.702 m)   Wt 64.4 kg (142 lb)   SpO2 99%   BMI 22.24 kg/m   Physical Exam  Constitutional: He is oriented to person, place, and time. He appears well-developed. He does not appear  ill.  He appears under nourished  HENT:  Head: Normocephalic and atraumatic.  Right Ear: External ear normal.  Left Ear: External ear normal.  Eyes: Conjunctivae and EOM are normal. Pupils are equal, round, and reactive to light.  Neck: Normal range of motion and phonation normal. Neck supple.  Cardiovascular: Normal rate, regular rhythm and normal heart sounds.  Pulmonary/Chest: Effort normal and breath sounds normal. No accessory muscle usage. No tachypnea. No respiratory distress. He exhibits no bony tenderness.  Abdominal: Soft. There is no tenderness.  Musculoskeletal: Normal range of motion.       Right lower leg: Normal. He exhibits no tenderness and no edema.       Left lower leg: He exhibits no tenderness and no edema.  Neurological: He is alert and oriented to person, place, and time. No cranial nerve deficit or sensory deficit. He exhibits normal muscle tone. Coordination normal.  Skin: Skin is warm, dry and intact.  Psychiatric: He has a normal mood and affect. His behavior is normal. Judgment and thought content normal.  Nursing note and vitals reviewed.    ED Treatments / Results  Labs (all labs ordered are listed, but only abnormal results are displayed) Labs Reviewed  BASIC METABOLIC PANEL - Abnormal; Notable for the following components:      Result Value   CO2 19 (*)    BUN <5 (*)    Calcium 8.1 (*)    All other components within normal limits  CBC WITH DIFFERENTIAL/PLATELET - Abnormal; Notable for the following components:   WBC 2.9 (*)    RBC 3.51 (*)    Hemoglobin 12.7 (*)    HCT 34.6 (*)    MCH 36.2 (*)    MCHC 36.7 (*)    Platelets 47 (*)    Neutro Abs 1.1 (*)    All other components within normal limits  I-STAT TROPONIN, ED    EKG  EKG Interpretation  Date/Time:  Friday April 03 2017 15:33:32 EST Ventricular Rate:  114 PR Interval:  136 QRS Duration: 80 QT Interval:  322 QTC Calculation: 443 R Axis:   39 Text Interpretation:  Sinus  tachycardia with Premature atrial complexes Nonspecific ST and T wave abnormality Abnormal ECG Since last tracing rate faster Nonspecific ST and T wave abnormality is new Confirmed by Daleen Bo [phone]) on 04/03/2017 7:27:44 PM       Radiology Dg Chest 2 View  Result Date: 04/03/2017 CLINICAL DATA:  Chest pain. EXAM: CHEST  2 VIEW COMPARISON:  02/05/2015 .  FINDINGS: Mediastinum and hilar structures normal. Prior CABG. Mild left base infiltrate cannot be excluded. No pleural effusion or pneumothorax. Old left rib fractures . IMPRESSION: Mild left base infiltrate cannot be excluded. Electronically Signed   By: Marcello Moores  Register   On: 04/03/2017 16:08    Procedures Procedures (including critical care time)  Medications Ordered in ED Medications  potassium chloride SA (K-DUR,KLOR-CON) CR tablet 40 mEq (40 mEq Oral Given 04/03/17 2027)  albuterol (PROVENTIL) (2.5 MG/3ML) 0.083% nebulizer solution 5 mg (5 mg Nebulization Given 04/03/17 2027)  predniSONE (DELTASONE) tablet 60 mg (60 mg Oral Given 04/03/17 2027)  doxycycline (VIBRA-TABS) tablet 100 mg (100 mg Oral Given 04/03/17 2027)  sodium chloride 0.9 % bolus 500 mL (0 mLs Intravenous Stopped 04/03/17 2214)  ondansetron (ZOFRAN) injection 4 mg (4 mg Intravenous Given 04/03/17 2134)     Initial Impression / Assessment and Plan / ED Course  I have reviewed the triage vital signs and the nursing notes.  Pertinent labs & imaging results that were available during my care of the patient were reviewed by me and considered in my medical decision making (see chart for details).  Clinical Course as of Apr 04 4  Fri Apr 03, 2017  2357 Low WBC: (!) 2.9 [EW]  2357 Low Hemoglobin: (!) 12.7 [EW]  2357 Low Platelets: (!) 47 [EW]  2357 Normal Troponin i, poc: 0.00 [EW]  2357 Normal Sodium: 137 [EW]  2357 Normal Potassium: 3.5 [EW]  2357 Normal Creatinine: 0.69 [EW]    Clinical Course User Index [EW] Daleen Bo, MD     Patient  Vitals for the past 24 hrs:  BP Temp Temp src Pulse Resp SpO2 Height Weight  04/03/17 2300 114/79 - - 96 (!) 22 99 % - -  04/03/17 2230 111/73 - - 91 (!) 22 100 % - -  04/03/17 2215 118/72 - - 89 (!) 21 100 % - -  04/03/17 2200 117/68 - - 66 (!) 22 100 % - -  04/03/17 2130 - - - 81 (!) 21 97 % - -  04/03/17 2115 117/80 - - 94 (!) 24 94 % - -  04/03/17 2100 117/79 - - 100 19 96 % - -  04/03/17 2045 118/71 - - 76 18 100 % - -  04/03/17 2015 113/74 - - (!) 58 (!) 22 98 % - -  04/03/17 2000 128/77 - - 61 (!) 21 95 % - -  04/03/17 1945 126/82 - - 88 14 100 % - -  04/03/17 1830 (!) 148/83 - - 72 16 98 % - -  04/03/17 1739 112/86 98 F (36.7 C) Oral 76 20 100 % - -  04/03/17 1544 (!) 152/129 97.9 F (36.6 C) Oral 92 20 100 % - -  04/03/17 1538 - - - - - - 5\' 7"  (1.702 m) 64.4 kg (142 lb)    12:03 AM Reevaluation with update and discussion. After initial assessment and treatment, an updated evaluation reveals patient vomited after initially being treated with a potassium pill.  He was then given potassium dissolved in water, which he also vomited.  He feels like that the initial tablet was stuck, which caused him to vomit.  At this time he is tolerating liquids and comfortable.Daleen Bo     Final Clinical Impressions(s) / ED Diagnoses   Final diagnoses:  Nonspecific chest pain  Hypokalemia  Tobacco abuse    Nonspecific discomfort, with reassuring evaluation.  Doubt ACS, PE or pneumonia.  Incidental  hypokalemia.  Ongoing tobacco abuse.  Suspect musculoskeletal left shoulder pain.  Nursing Notes Reviewed/ Care Coordinated Applicable Imaging Reviewed Interpretation of Laboratory Data incorporated into ED treatment  The patient appears reasonably screened and/or stabilized for discharge and I doubt any other medical condition or other [facility] requiring further screening, evaluation, or treatment in the ED at this time prior to discharge.  Plan: Home Medications-APAP for pain, continue  current medications; Home Treatments-rest, fluids; return here if the recommended treatment, does not improve the symptoms; Recommended follow up-PCP of choice, 1-2 weeks for follow-up care.  Recheck potassium level at that time   ED Discharge Orders        Ordered    potassium chloride (K-DUR) 10 MEQ tablet  2 times daily     04/04/17 0000       Daleen Bo, MD 04/04/17 [phone]

## 2017-04-03 NOTE — ED Notes (Signed)
Pt placed on 2L for SpO2 sats dropping into the 80s while resting.

## 2017-04-03 NOTE — ED Notes (Signed)
ED Provider at bedside. 

## 2017-04-03 NOTE — ED Triage Notes (Signed)
Pt to ED with c/o left chest pain onset 3 days ago.  Pt st's feels like heart is fluttering.  Pt also c/o nausea, vomiting and shortness of breath.

## 2017-04-03 NOTE — ED Notes (Signed)
Pt vomited x1. Pt's heart rate jumped up into the 150s. Currently resting at 102. Pt reports the dissolved potassium did not settle well in his stomach. EDP aware.

## 2017-04-03 NOTE — ED Notes (Signed)
Pt given apple juice for fluid challenge. Pt tolerating juice well. Pt had difficulty with swallowing potassium pills - cut in half. Will continue to monitor.

## 2017-04-03 NOTE — ED Notes (Signed)
Pt's potassium pills given by dissolving in water d/t pt unable to swallow half of pill at a time.

## 2017-04-03 NOTE — ED Notes (Signed)
Pt given apple juice for fluid challenge. Pt tolerating well. Will continue to monitor.

## 2017-04-04 MED ORDER — POTASSIUM CHLORIDE ER 10 MEQ PO TBCR: 10.0000 meq | EXTENDED_RELEASE_TABLET | Freq: Two times a day (BID) | ORAL | 0 refills | Status: DC

## 2017-04-04 NOTE — Discharge Instructions (Signed)
It is important to stop smoking.  For the pain which you are having, use Tylenol every 4 hours.  We are prescribing potassium, to improve your level.  Follow-up with a primary care doctor, or cardiologist in 1 or 2 weeks for repeat potassium level check.  Use the resource guide to help you find a doctor to see for ongoing management of your medical conditions.

## 2017-04-04 NOTE — ED Notes (Signed)
Pt verbalizes understanding of d/c instructions. Pt received prescriptions. Pt ambulatory at d/c with all belongings.  

## 2017-05-19 ENCOUNTER — Encounter: Payer: Self-pay | Admitting: Family Medicine

## 2017-05-19 ENCOUNTER — Ambulatory Visit (INDEPENDENT_AMBULATORY_CARE_PROVIDER_SITE_OTHER): Payer: Commercial Managed Care - HMO | Admitting: Family Medicine

## 2017-05-19 VITALS — BP 110/68 | HR 70 | Ht 67.0 in | Wt 129.2 lb

## 2017-05-19 DIAGNOSIS — Z951 Presence of aortocoronary bypass graft: Secondary | ICD-10-CM | POA: Diagnosis not present

## 2017-05-19 DIAGNOSIS — Z8619 Personal history of other infectious and parasitic diseases: Secondary | ICD-10-CM | POA: Diagnosis not present

## 2017-05-19 DIAGNOSIS — N529 Male erectile dysfunction, unspecified: Secondary | ICD-10-CM

## 2017-05-19 DIAGNOSIS — K7030 Alcoholic cirrhosis of liver without ascites: Secondary | ICD-10-CM | POA: Diagnosis not present

## 2017-05-19 DIAGNOSIS — E785 Hyperlipidemia, unspecified: Secondary | ICD-10-CM

## 2017-05-19 DIAGNOSIS — I739 Peripheral vascular disease, unspecified: Secondary | ICD-10-CM

## 2017-05-19 DIAGNOSIS — F17200 Nicotine dependence, unspecified, uncomplicated: Secondary | ICD-10-CM | POA: Diagnosis not present

## 2017-05-19 NOTE — Progress Notes (Signed)
Subjective:    Patient ID: Zachary Steele, male    DOB: 1950/06/29, 67 y.o.   MRN: [id number]  HPI Chief Complaint  Patient presents with  . new pt    new pt get established. cramping in the thumbs, leg issues but seen vasular, low testosterone.   He is new to the practice and here to establish care.  Previous medical care: [facility]- Augusto Gamble FNP Last CPE: 05/15/2016 Medicare Wellness   States he feels fine but does complain of aching and intermittent cramping in his thumbs. 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 This has been chronic and is not worsening. Pain is relieved with rest. No redness, swelling, morning stiffness.  States he takes Aleve or ibuprofen as needed.   Complains of erectile dysfunction that has been chronic. States he took Viagra at one point in the distant past. Questions whether I would prescribe this for him today. Reports difficulty getting and maintaining an erection.   History of CAD, CABG- has not followed up with his cardiologist in years.   History of hepatitis C and states he was treated for this. It is unclear who treated him for this. He cannot recall.  History of alcoholic cirrhosis. States he is not being followed by anyone for this. States he has not seen GI in years.   Alcohol dependence-stopped drinking cold [place] 2 months ago.  States he goes to [place] occasionally.   PVD and PAD- managed by vascular surgeon. Is due to follow up in March. States he wears compression stockings and this helps. Is not taking Gabapentin but has in the past. States pain is tolerable. Denies numbness, tingling or weakness.   No increased pain with walking.   History of hyperlipidemia. Not on statin due to liver disease.   Other providers: [facility]. Dr. Gwenlyn Found- cardiologist. Dr. Oneida Alar - vascular surgeon  Surgeries: Triple Bypass 2010. Cardiac cath. 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  Daughter who is 75 years old lives with them.   Smoker 2-3 cigarettes per day now. 49 year history.  Occasional marijuana use  Denies fever, chills, fatigue, unexplained weight loss,  dizziness, chest pain, palpitations, shortness of breath, abdominal pain, N/V/D,  LE edema.   Reviewed allergies, medications, past medical, surgical, family, and social history.    Review of Systems Pertinent positives and negatives in the history of present illness.     Objective:   Physical Exam  Constitutional: He is oriented to person, place, and time. He appears well-developed. No distress.  HENT:  Mouth/Throat: Oropharynx is clear and moist.  Cardiovascular: Normal rate and regular rhythm.  Pulmonary/Chest: Effort normal and breath sounds normal.  Abdominal: Soft. Normal appearance and bowel sounds are normal. There is no tenderness. There is no rigidity, no rebound, no guarding, no tenderness at McBurney's point and negative Murphy's sign.  Neurological: He is alert and oriented to person, place, and time. He has normal strength. Gait normal.  Skin: Skin is warm and dry. No rash noted. No pallor.  Psychiatric: He has a normal mood and affect. His speech is normal and behavior is normal. Thought content normal.   BP 110/68   Pulse 70   Ht 5\' 7"  (1.702 m)   Wt 129 lb 3.2 oz (58.6 kg)   BMI 20.24 kg/m       Assessment &  Plan:  Alcoholic cirrhosis of liver without ascites ([place]) - Plan: CBC with Differential/Platelet, Comprehensive metabolic panel, Ethanol, Hepatitis B surface antibody, HCV RNA quant  Peripheral arterial disease ([place]) - Plan: Lipid panel  Hx of CABG - Plan: Lipid panel  Smoker  Erectile dysfunction, unspecified erectile dysfunction type  PVD (peripheral vascular disease) ([facility]) - Plan: Lipid panel  History of hepatitis C - Plan: HCV RNA quant  Hyperlipidemia, unspecified hyperlipidemia type - Plan: Lipid  panel  He has a complex medical history and has not followed up on cirrhosis or heart disease.  Plan to have him follow up with his cardiologist. I will defer to cardiology as to whether he is safe to try ED medication.  Congratulated him on stopping alcohol and cutting back on smoking.  Encouraged him to go to AA and to seek outpatient rehab for alcohol addition if he is ready to do this.  Will check a viral load for Hepatitis C. It is unclear where or when he was treated.  He expresses that he would like to see if he can get on a liver transplant list.  I will refer him back to GI to discuss this.  Follow up pending labs. He will be due for a Medicare Wellness visit soon.  Spent at least 45 minutes face to face with patient and more than 50% of this was in counseling and coordination of care.

## 2017-05-19 NOTE — Patient Instructions (Signed)
Call and schedule with Dr. Gwenlyn Found cardiologist. [facility] at [address] [place] [phone]  We will call you with your lab results.

## 2017-05-20 LAB — COMPREHENSIVE METABOLIC PANEL
ALT: 35 IU/L (ref 0–44)
AST: 52 IU/L — ABNORMAL HIGH (ref 0–40)
Albumin/Globulin Ratio: 0.7 — ABNORMAL LOW (ref 1.2–2.2)
Albumin: 3.1 g/dL — ABNORMAL LOW (ref 3.6–4.8)
Alkaline Phosphatase: 112 IU/L (ref 39–117)
BUN/Creatinine Ratio: 7 — ABNORMAL LOW (ref 10–24)
BUN: 5 mg/dL — ABNORMAL LOW (ref 8–27)
Bilirubin Total: 0.9 mg/dL (ref 0.0–1.2)
CO2: 22 mmol/L (ref 20–29)
Calcium: 9 mg/dL (ref 8.6–10.2)
Chloride: 103 mmol/L (ref 96–106)
Creatinine, Ser: 0.72 mg/dL — ABNORMAL LOW (ref 0.76–1.27)
GFR calc Af Amer: 112 mL/min/{1.73_m2} (ref >59)
GFR calc non Af Amer: 97 mL/min/{1.73_m2} (ref >59)
Globulin, Total: 4.2 g/dL (ref 1.5–4.5)
Glucose: 83 mg/dL (ref 65–99)
Potassium: 3.9 mmol/L (ref 3.5–5.2)
Sodium: 140 mmol/L (ref 134–144)
Total Protein: 7.3 g/dL (ref 6.0–8.5)

## 2017-05-20 LAB — CBC WITH DIFFERENTIAL/PLATELET
Basophils Absolute: 0.1 10*3/uL (ref 0.0–0.2)
Basos: 1 %
EOS (ABSOLUTE): 0.3 10*3/uL (ref 0.0–0.4)
Eos: 7 %
Hematocrit: 37.5 % (ref 37.5–51.0)
Hemoglobin: 12.9 g/dL — ABNORMAL LOW (ref 13.0–17.7)
Immature Grans (Abs): 0 10*3/uL (ref 0.0–0.1)
Immature Granulocytes: 0 %
Lymphocytes Absolute: 1.7 10*3/uL (ref 0.7–3.1)
Lymphs: 37 %
MCH: 35.6 pg — ABNORMAL HIGH (ref 26.6–33.0)
MCHC: 34.4 g/dL (ref 31.5–35.7)
MCV: 104 fL — ABNORMAL HIGH (ref 79–97)
Monocytes Absolute: 0.5 10*3/uL (ref 0.1–0.9)
Monocytes: 12 %
Neutrophils Absolute: 1.9 10*3/uL (ref 1.4–7.0)
Neutrophils: 43 %
Platelets: 90 10*3/uL — CL (ref 150–379)
RBC: 3.62 x10E6/uL — ABNORMAL LOW (ref 4.14–5.80)
RDW: 13.5 % (ref 12.3–15.4)
WBC: 4.5 10*3/uL (ref 3.4–10.8)

## 2017-05-20 LAB — HCV RNA QUANT
HCV log10: 4.09 log10 IU/mL
Hepatitis C Quantitation: 12300 IU/mL

## 2017-05-20 LAB — LIPID PANEL
Chol/HDL Ratio: 4 ratio (ref 0.0–5.0)
Cholesterol, Total: 119 mg/dL (ref 100–199)
HDL: 30 mg/dL — ABNORMAL LOW (ref >39)
LDL Calculated: 77 mg/dL (ref 0–99)
Triglycerides: 60 mg/dL (ref 0–149)
VLDL Cholesterol Cal: 12 mg/dL (ref 5–40)

## 2017-05-20 LAB — HEPATITIS B SURFACE ANTIBODY, QUANTITATIVE: Hepatitis B Surf Ab Quant: <3.1 m[IU]/mL — ABNORMAL LOW (ref >9.9)

## 2017-05-20 LAB — ETHANOL: Ethanol: NEGATIVE %

## 2017-05-21 ENCOUNTER — Other Ambulatory Visit: Payer: Self-pay | Admitting: Family Medicine

## 2017-05-21 DIAGNOSIS — K7030 Alcoholic cirrhosis of liver without ascites: Secondary | ICD-10-CM

## 2017-06-03 ENCOUNTER — Encounter: Payer: Self-pay | Admitting: Internal Medicine

## 2017-06-12 ENCOUNTER — Encounter: Payer: Self-pay | Admitting: Cardiovascular Disease

## 2017-06-12 ENCOUNTER — Ambulatory Visit (INDEPENDENT_AMBULATORY_CARE_PROVIDER_SITE_OTHER): Payer: Medicare HMO | Admitting: Cardiovascular Disease

## 2017-06-12 DIAGNOSIS — I739 Peripheral vascular disease, unspecified: Secondary | ICD-10-CM | POA: Diagnosis not present

## 2017-06-12 DIAGNOSIS — Z951 Presence of aortocoronary bypass graft: Secondary | ICD-10-CM

## 2017-06-12 NOTE — Patient Instructions (Addendum)
Medication Instructions: Your physician recommends that you continue on your current medications as directed. Please refer to the Current Medication list given to you today.   Follow-Up: Your physician wants you to follow-up in: 1 year with Dr. Gwenlyn Found. You will receive a reminder letter in the mail two months in advance. If you don't receive a letter, please call our office to schedule the follow-up appointment.  If you need refills on your cardiac medications prior to your next appointment, please contact your pharmacy.

## 2017-06-12 NOTE — Progress Notes (Signed)
06/12/2017 BHAVIK CABINESS   04/04/1951  [id number]  Primary Physician [name], NP-C Primary Cardiologist: Lorretta Harp MD Lupe Carney, [place]  HPI:  Zachary Steele is a 67 y.o.  thin appearing married African-American male father of one child who currently is out of work. He has no primary care physician. He was referred by the emergency room for evaluation of peripheral arterial disease. He was remotely a patient of Dr. Shanon Brow Harding's, who last saw him in the office 09/13/14. He has a history of tobacco abuse only smoking 1-4 cigars a day. He also has a history of polysubstance abuse including marijuana and alcohol cirrhosis alcohol cirrhosis.Marland Kitchen He has a history of coronary artery disease status post bypass grafting January 2010 with Myoview performed 11/15/10 was nonischemic. He did have a partially Dopplers performed in our office 01/03/11 showed normal ABIs with tibial vessel disease. He complained of lower extremity discomfort which is somewhat atypical for claudication along with numbness. He is not a diabetic. Marland Kitchen Doppler studies that showed tibial vessel disease without significant obstructive disease otherwise. Also gives symptoms compatible with restless leg syndrome. He apparently has stopped drinking alcohol and smokes minimally.   Current Meds  Medication Sig  . aspirin 81 MG tablet Take 81 mg by mouth daily.  . Cholecalciferol (VITAMIN D3 PO) Take 125 mcg by mouth daily.  . Liver Extract (LIVER PO) Take by mouth.  . Multiple Vitamin (MULTIVITAMIN WITH MINERALS) TABS tablet Take 1 tablet by mouth daily.     Allergies  Allergen Reactions  . Statins Other (See Comments)    Alcoholic cirrhosis, cardiology has declined to start statin    Social History   Socioeconomic History  . Marital status: Married    Spouse name: Not on file  . Number of children: Not on file  . Years of education: Not on file  . Highest education level: Not on file  Social Needs  .  Financial resource strain: Not on file  . Food insecurity - worry: Not on file  . Food insecurity - inability: Not on file  . Transportation needs - medical: Not on file  . Transportation needs - non-medical: Not on file  Occupational History  . Not on file  Tobacco Use  . Smoking status: Current Some Day Smoker    Packs/day: 0.03    Years: 52.00    Pack years: 1.56    Types: Cigarettes  . Smokeless tobacco: Never Used  . Tobacco comment: Is trying to stop smoking, still has one or two per day most days  Substance and Sexual Activity  . Alcohol use: No    Frequency: Never    Comment: used to drink  . Drug use: No    Comment: occ  . Sexual activity: Yes  Other Topics Concern  . Not on file  Social History Narrative  . Not on file     Review of Systems: General: negative for chills, fever, night sweats or weight changes.  Cardiovascular: negative for chest pain, dyspnea on exertion, edema, orthopnea, palpitations, paroxysmal nocturnal dyspnea or shortness of breath Dermatological: negative for rash Respiratory: negative for cough or wheezing Urologic: negative for hematuria Abdominal: negative for nausea, vomiting, diarrhea, bright red blood per rectum, melena, or hematemesis Neurologic: negative for visual changes, syncope, or dizziness All other systems reviewed and are otherwise negative except as noted above.    Blood pressure 118/75, pulse 67, height 5\' 7"  (1.702 m), weight 129 lb  12.8 oz (58.9 kg).  General appearance: alert and no distress Neck: no adenopathy, no carotid bruit, no JVD, supple, symmetrical, trachea midline and thyroid not enlarged, symmetric, no tenderness/mass/nodules Lungs: clear to auscultation bilaterally Heart: regular rate and rhythm, S1, S2 normal, no murmur, click, rub or gallop Extremities: extremities normal, atraumatic, no cyanosis or edema Pulses: 2+ and symmetric Skin: Skin color, texture, turgor normal. No rashes or  lesions Neurologic: Alert and oriented X 3, normal strength and tone. Normal symmetric reflexes. Normal coordination and gait  EKG not performed today  ASSESSMENT AND PLAN:   Hx of CABG History of CAD status post coronary artery bypass grafting 3 in 2010 with a LIMA to the LAD, vein to the circumflex and RCA. He had a low risk Myoview in 2012. He denies chest pain or shortness of breath.  Peripheral arterial disease (HCC) History of peripheral arterial disease with also symptoms of restless leg syndrome. I had evaluated him in the past revealing some tibial vessel disease but nothing that required intervention. He really denies lifestyle limiting claudication.      Lorretta Harp MD FACP,FACC,FAHA, [facility] 06/12/2017 9:01 AM

## 2017-06-12 NOTE — Assessment & Plan Note (Signed)
History of peripheral arterial disease with also symptoms of restless leg syndrome. I had evaluated him in the past revealing some tibial vessel disease but nothing that required intervention. He really denies lifestyle limiting claudication.

## 2017-06-12 NOTE — Assessment & Plan Note (Signed)
History of CAD status post coronary artery bypass grafting 3 in 2010 with a LIMA to the LAD, vein to the circumflex and RCA. He had a low risk Myoview in 2012. He denies chest pain or shortness of breath.

## 2017-06-19 ENCOUNTER — Other Ambulatory Visit: Payer: Self-pay | Admitting: Nurse Practitioner

## 2017-06-19 DIAGNOSIS — B182 Chronic viral hepatitis C: Secondary | ICD-10-CM

## 2017-06-23 ENCOUNTER — Other Ambulatory Visit: Payer: Self-pay | Admitting: Nurse Practitioner

## 2017-06-23 DIAGNOSIS — K7469 Other cirrhosis of liver: Secondary | ICD-10-CM

## 2017-07-02 ENCOUNTER — Other Ambulatory Visit: Payer: Self-pay

## 2017-07-07 ENCOUNTER — Other Ambulatory Visit: Payer: Self-pay

## 2017-07-15 ENCOUNTER — Ambulatory Visit
Admission: RE | Admit: 2017-07-15 | Discharge: 2017-07-15 | Disposition: A | Discharge status: Home / Self Care | Payer: Medicare HMO | Source: Ambulatory Visit | Attending: Nurse Practitioner | Admitting: Nurse Practitioner

## 2017-07-15 DIAGNOSIS — B182 Chronic viral hepatitis C: Secondary | ICD-10-CM

## 2017-07-16 ENCOUNTER — Other Ambulatory Visit: Payer: Self-pay | Admitting: *Deleted

## 2017-07-16 ENCOUNTER — Encounter (HOSPITAL_COMMUNITY): Payer: Self-pay

## 2017-07-16 ENCOUNTER — Telehealth (HOSPITAL_COMMUNITY): Payer: Self-pay | Admitting: *Deleted

## 2017-07-16 ENCOUNTER — Ambulatory Visit: Payer: Medicare HMO | Admitting: Family

## 2017-07-16 NOTE — Telephone Encounter (Addendum)
I contacted this patient on behalf of Dr. Oneida Alar to let him know that he can follow up with Dr. Alvester Chou for this issue.   ----- Message from Elam Dutch, MD sent at 07/15/2017  2:44 PM EDT ----- Regarding: RE: Patient cancelling follow up  [facility] thanks ----- Message ----- From: Martina Brodbeck, Dionne Ano, RVT, RDCS Sent: 07/15/2017   9:23 AM To: Elam Dutch, MD, Mena Goes, RN Subject: Patient cancelling follow up                   Good morning Dr. Oneida Alar,  When confirming Mr. Oestreicher's follow up appointments (LEA + NP) the patient stated he was told there is nothing that can be done for him and therefore wanted to cancel all follow up appointments.  He requests a phone call from our office letting him know one way or the other if there is anything that can be done and if so he will reschedule.  I let him know I would send you a message and that you or a nurse would call him back to discuss treatment follow up.  Thank you, Juliann Pulse Alroy Portela

## 2017-07-24 ENCOUNTER — Ambulatory Visit
Admission: RE | Admit: 2017-07-24 | Discharge: 2017-07-24 | Disposition: A | Discharge status: Home / Self Care | Payer: Medicare HMO | Source: Ambulatory Visit | Attending: Nurse Practitioner | Admitting: Nurse Practitioner

## 2017-07-24 DIAGNOSIS — K7469 Other cirrhosis of liver: Secondary | ICD-10-CM

## 2017-07-24 MED ORDER — GADOXETATE DISODIUM 0.25 MMOL/ML IV SOLN
7.0000 mL | Freq: Once | INTRAVENOUS | Status: AC | PRN
  Administered 2017-07-24: 7 mL via INTRAVENOUS

## 2017-07-30 ENCOUNTER — Other Ambulatory Visit: Payer: Self-pay | Admitting: Nurse Practitioner

## 2017-07-30 ENCOUNTER — Encounter: Payer: Self-pay | Admitting: Family Medicine

## 2017-07-30 ENCOUNTER — Ambulatory Visit: Payer: Self-pay | Admitting: Family Medicine

## 2017-07-30 DIAGNOSIS — C220 Liver cell carcinoma: Secondary | ICD-10-CM

## 2017-07-30 DIAGNOSIS — D751 Secondary polycythemia: Principal | ICD-10-CM

## 2017-08-12 ENCOUNTER — Encounter: Payer: Self-pay | Admitting: Nurse Practitioner

## 2017-08-12 ENCOUNTER — Ambulatory Visit
Admission: RE | Admit: 2017-08-12 | Discharge: 2017-08-12 | Disposition: A | Discharge status: Home / Self Care | Payer: Medicare HMO | Source: Ambulatory Visit | Attending: Nurse Practitioner | Admitting: Nurse Practitioner

## 2017-08-12 DIAGNOSIS — C220 Liver cell carcinoma: Secondary | ICD-10-CM

## 2017-08-12 HISTORY — PX: IR RADIOLOGIST EVAL & MGMT: IMG5224

## 2017-08-12 NOTE — Consult Note (Signed)
Chief Complaint:   Hepatocellular carcinoma  Referring Physician(s): Drazek,Dawn  History of Present Illness: Zachary Steele is a 67 y.o. male African-American male with chronic hepatitis C and remote history of alcoholism.  Recent labs demonstrated an elevated AFP of 2431.  Initially he underwent surveillance ultrasound confirming several hepatic lesions, largest measuring 5.1 cm with evidence of cirrhosis.  This led to an MRI scan 07/24/2017 demonstrating at least 4 hepatic lesions compatible with multifocal hepato carcinoma, dominant lesion measures 5.5 cm in segment 4A.  He remains asymptomatic.  No significant abdominal pain, flank or epigastric pain.  No signs of jaundice or liver failure.  He no longer drinks alcohol.  He continues to smoke.  Overall he has an excellent functional status.  No additional history of malignancy.  Past Medical History:  Diagnosis Date  . Alcohol dependence ([place])   . Anemia   . Arthritis   . Cataract   . Cirrhosis ([place])   . Coronary artery disease due to lipid rich plaque   . Depression   . Hepatitis C    Has A and B also  . PAD (peripheral artery disease) ([place])    pt reports he has this and has chronic leg "soreness"  . Peripheral vascular disease ([place])    essentially normal circulations with two-vessel  runoff to the feet bilaterally  . Tobacco abuse     Past Surgical History:  Procedure Laterality Date  . CARDIAC CATHETERIZATION    . CORONARY ARTERY BYPASS GRAFT  2010   Triple bypass at [facility]  . DOPPLER ECHOCARDIOGRAPHY  2011  . IR RADIOLOGIST EVAL & MGMT  08/12/2017  . NM MYOVIEW LTD  2012    Allergies: Statins  Medications: Prior to Admission medications   Medication Sig Start Date End Date Taking? Authorizing Provider  aspirin 81 MG tablet Take 81 mg by mouth daily.   Yes [provider]  Cholecalciferol (VITAMIN D3 PO) Take 125 mcg by mouth daily.   Yes [provider]  ibuprofen (ADVIL,MOTRIN) 400 MG  tablet Take 400 mg by mouth every 6 (six) hours as needed.   Yes [provider]  Liver Extract (LIVER PO) Take by mouth.   Yes [provider]  Multiple Vitamin (MULTIVITAMIN WITH MINERALS) TABS tablet Take 1 tablet by mouth daily.   Yes [provider]     Family History  Problem Relation Age of Onset  . Stroke Mother   . Colon cancer Neg Hx     Social History   Socioeconomic History  . Marital status: Married    Spouse name: Not on file  . Number of children: Not on file  . Years of education: Not on file  . Highest education level: Not on file  Occupational History  . Not on file  Social Needs  . Financial resource strain: Not on file  . Food insecurity:    Worry: Not on file    Inability: Not on file  . Transportation needs:    Medical: Not on file    Non-medical: Not on file  Tobacco Use  . Smoking status: Current Some Day Smoker    Packs/day: 0.03    Years: 52.00    Pack years: 1.56    Types: Cigarettes  . Smokeless tobacco: Never Used  . Tobacco comment: Is trying to stop smoking, still has one or two per day most days  Substance and Sexual Activity  . Alcohol use: No  Frequency: Never    Comment: used to drink  . Drug use: No    Comment: occ  . Sexual activity: Yes  Lifestyle  . Physical activity:    Days per week: Not on file    Minutes per session: Not on file  . Stress: Not on file  Relationships  . Social connections:    Talks on phone: Not on file    Gets together: Not on file    Attends religious service: Not on file    Active member of club or organization: Not on file    Attends meetings of clubs or organizations: Not on file    Relationship status: Not on file  Other Topics Concern  . Not on file  Social History Narrative  . Not on file    ECOG Status: 1 - Symptomatic but completely ambulatory  Review of Systems: A 12 point ROS discussed and pertinent positives are indicated in the HPI above.  All other  systems are negative.  Review of Systems  Vital Signs: BP 132/76   Pulse 63   Temp 97.8 F (36.6 C) (Oral)   Resp 14   Ht 5\' 7"  (1.702 m)   Wt 138 lb (62.6 kg)   SpO2 100%   BMI 21.61 kg/m   Physical Exam  Constitutional: He is oriented to person, place, and time. He appears well-developed and well-nourished. No distress.  Eyes: Conjunctivae are normal. No scleral icterus.  Cardiovascular: Normal rate and regular rhythm.  Pulmonary/Chest: Effort normal and breath sounds normal.  Abdominal: Soft. Bowel sounds are normal. He exhibits no distension and no mass.  Musculoskeletal: Normal range of motion. He exhibits no edema.  Neurological: He is alert and oriented to person, place, and time.  Skin: Skin is warm and dry. He is not diaphoretic.  Psychiatric: He has a normal mood and affect.    Imaging: Mr Abdomen Wwo Contrast  Result Date: 07/25/2017 CLINICAL DATA:  Cirrhosis. Chronic hepatitis-C. Multiple liver masses on recent ultrasound. Creatinine was obtained on site at [facility] at [address] Results: Creatinine 0.8 mg/dL. EXAM: MRI ABDOMEN WITHOUT AND WITH CONTRAST TECHNIQUE: Multiplanar multisequence MR imaging of the abdomen was performed both before and after the administration of intravenous contrast. CONTRAST:  37mL EOVIST GADOXETATE DISODIUM 0.25 MOL/L IV SOLN COMPARISON:  Ultrasound on 07/15/2017 and MRI on 05/20/2010 FINDINGS: Lower chest: No acute findings. Hepatobiliary: Right hepatic lobe atrophy and mild left lobe hypertrophy is consistent with cirrhosis. New mass is seen in segment 4A which shows arterial phase enhancement, contrast washout, and delayed rim enhancement, characteristic of hepatoma. This measures 5.5 x 4.6 cm. Smaller masses with same imaging characteristics are seen in central aspect of segment 4B adjacent to the porta hepatis on image 33/8, inferior right hepatic lobe in segment 5 measuring 2.4 x 1.6 cm on image 44/17, and in segment 6  measuring 1.0 cm on image 36/17. Gallstones are seen, however there is no evidence of cholecystitis or biliary dilatation. Pancreas:  No mass or inflammatory changes. Spleen:  Within normal limits in size and appearance. Adrenals/Urinary Tract: No masses identified. No evidence of hydronephrosis. Stomach/Bowel: No evidence of dilated bowel loops. Proximal jejunum is seen in the right upper quadrant, consistent with congenital small bowel malrotation. No evidence of volvulus. Vascular/Lymphatic: No pathologically enlarged lymph nodes identified. No abdominal aortic aneurysm. Other:  None. Musculoskeletal:  No suspicious bone lesions identified. IMPRESSION: At least 4 separate hepatic masses with characteristics of multifocal hepatoma, largest measuring  5.5 cm. No evidence of abdominal metastatic disease. Cholelithiasis.  No radiographic evidence of cholecystitis. Incidentally noted congenital small bowel malrotation. No evidence of volvulus or bowel obstruction. Electronically Signed   By: Earle Gell M.D.   On: 07/25/2017 10:14   US Abdomen Complete W/elastography  Result Date: 07/15/2017 CLINICAL DATA:  Chronic hepatitis-C without hepatic coma. EXAM: ULTRASOUND ABDOMEN ULTRASOUND HEPATIC ELASTOGRAPHY TECHNIQUE: Sonography of the upper abdomen was performed. In addition, ultrasound elastography evaluation of the liver was performed. A region of interest was placed within the right lobe of the liver. Following application of a compressive sonographic pulse, shear waves were detected in the adjacent hepatic tissue and the shear wave velocity was calculated. Multiple assessments were performed at the selected site. Median shear wave velocity is correlated to a Metavir fibrosis score. COMPARISON:  None. FINDINGS: ULTRASOUND ABDOMEN Gallbladder: Multiple gallstones are seen, largest measuring 1.1 cm. No evidence of gallbladder wall thickening or pericholecystic fluid. No sonographic Murphy sign noted by sonographer.  Common bile duct: Diameter: 2 mm, within normal limits. Liver: Several heterogeneous hypoechoic masses are seen, largest measuring 5.1 cm. Portal vein is patent on color Doppler imaging with normal direction of blood flow towards the liver. IVC: No abnormality visualized. Pancreas: Visualized portion unremarkable. Spleen: Size and appearance within normal limits. Right Kidney: Length: 10.1 cm. Echogenicity within normal limits. No mass or hydronephrosis visualized. Left Kidney: Length: 10.7 cm. Echogenicity within normal limits. No mass or hydronephrosis visualized. Abdominal aorta: No aneurysm visualized. Other findings: None. ULTRASOUND HEPATIC ELASTOGRAPHY Device: Siemens Helix VTQ Patient position: Oblique Transducer 6C1 Number of measurements: 10 Hepatic segment:  8 Median velocity:   2.52 m/sec IQR: 0.23 IQR/Median velocity ratio: 0.09 Corresponding Metavir fibrosis score:  Some F3 + F4 Risk of fibrosis: High Limitations of exam: None Please note that abnormal shear wave velocities may also be identified in clinical settings other than with hepatic fibrosis, such as: acute hepatitis, elevated right heart and central venous pressures including use of beta blockers, veno-occlusive disease (Budd-Chiari), infiltrative processes such as mastocytosis/amyloidosis/infiltrative tumor, extrahepatic cholestasis, in the post-prandial state, and liver transplantation. Correlation with patient history, laboratory data, and clinical condition recommended. IMPRESSION: ULTRASOUND ABDOMEN: Multiple hypoechoic liver masses, suspicious for multifocal hepatoma or hepatic metastatic disease. Abdomen MRI without and with contrast is recommended for further characterization. Cholelithiasis. No sonographic signs of acute cholecystitis or biliary dilatation. These results will be called to the ordering clinician or representative by the Radiologist Assistant, and communication documented in the PACS or zVision Dashboard. ULTRASOUND  HEPATIC ELASTOGRAPHY: Median hepatic shear wave velocity is calculated at 2.52 m/sec. Corresponding Metavir fibrosis score is Some F3 + F4. Risk of fibrosis is High. Follow-up: Follow up advised Electronically Signed   By: Earle Gell M.D.   On: 07/15/2017 10:22   Ir Radiologist Eval & Mgmt  Result Date: 08/12/2017 Please refer to notes tab for details about interventional procedure. (Op Note)   Labs:  CBC: Recent Labs    04/03/17 1727 05/19/17 1010  WBC 2.9* 4.5  HGB 12.7* 12.9*  HCT 34.6* 37.5  PLT 47* 90*    COAGS: No results for input(s): INR, APTT in the last 8760 hours.  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  MR imaging is consistent with hepatocellular carcinoma.  Patient has not had a formal biopsy.  Treatment options were reviewed including embolization therapies.  Dominant hepatic lesion is up to 5.5 cm.  He is not a candidate for ablation.  Y 90 radio embolization is his best option at this point since he is not a surgical candidate.  The Y 90 embolization procedure was reviewed in detail including the pretreatment planning phase, treatment day, expected recovery, outcomes and goals.  He understands this is a palliative therapy and his disease is not curable.  He would like to proceed with Y 90 radioembolization.  Prior to this, I would like to have him evaluated by medical oncology (Dr. Burr Medico).  He may also need a biopsy prior to the embolization for definitive diagnosis.  Thank you for this interesting consult.  I greatly enjoyed meeting Zachary Steele  and look forward to participating in their care.  A copy of this report was sent to the requesting provider on this date.  Electronically Signed: Greggory Keen 08/12/2017, 11:06 AM   I spent a total of  40 Minutes   in face to face in clinical consultation, greater than 50% of which was counseling/coordinating care for this patient with multifocal hepatocellular carcinoma.

## 2017-08-19 ENCOUNTER — Encounter: Payer: Self-pay | Admitting: Family Medicine

## 2017-08-19 ENCOUNTER — Telehealth: Payer: Self-pay | Admitting: Nurse Practitioner

## 2017-08-19 NOTE — Telephone Encounter (Signed)
Pt cld to reschedule appt to see Lacie on 5/14 at 1pm.

## 2017-08-21 ENCOUNTER — Ambulatory Visit: Payer: Self-pay | Admitting: Nurse Practitioner

## 2017-08-25 ENCOUNTER — Inpatient Hospital Stay: Payer: Medicare HMO | Attending: Nurse Practitioner | Admitting: Nurse Practitioner

## 2017-08-25 ENCOUNTER — Telehealth: Payer: Self-pay | Admitting: Nurse Practitioner

## 2017-08-25 ENCOUNTER — Encounter: Payer: Self-pay | Admitting: Nurse Practitioner

## 2017-08-25 ENCOUNTER — Ambulatory Visit: Payer: Self-pay | Admitting: Physician Assistant

## 2017-08-25 VITALS — BP 132/69 | HR 66 | Temp 97.5°F | Resp 18 | Ht 67.0 in | Wt 127.8 lb

## 2017-08-25 DIAGNOSIS — D696 Thrombocytopenia, unspecified: Secondary | ICD-10-CM | POA: Insufficient documentation

## 2017-08-25 DIAGNOSIS — F1021 Alcohol dependence, in remission: Secondary | ICD-10-CM | POA: Insufficient documentation

## 2017-08-25 DIAGNOSIS — I739 Peripheral vascular disease, unspecified: Secondary | ICD-10-CM | POA: Insufficient documentation

## 2017-08-25 DIAGNOSIS — Z720 Tobacco use: Secondary | ICD-10-CM | POA: Diagnosis not present

## 2017-08-25 DIAGNOSIS — K7460 Unspecified cirrhosis of liver: Secondary | ICD-10-CM | POA: Insufficient documentation

## 2017-08-25 DIAGNOSIS — B182 Chronic viral hepatitis C: Secondary | ICD-10-CM

## 2017-08-25 DIAGNOSIS — C220 Liver cell carcinoma: Secondary | ICD-10-CM | POA: Insufficient documentation

## 2017-08-25 DIAGNOSIS — I25810 Atherosclerosis of coronary artery bypass graft(s) without angina pectoris: Secondary | ICD-10-CM | POA: Diagnosis not present

## 2017-08-25 NOTE — Telephone Encounter (Signed)
Scheduled appt per 5/14 los - Gave patient aVS and calender per los. 

## 2017-08-25 NOTE — Progress Notes (Addendum)
[place]  [phone] Fax:([phone] [place] Note   Patient Care Team: [name], NP-C as PCP - General (Family Medicine) Milus Banister, MD as Attending Physician (Gastroenterology) Ilean China as Physician Assistant (Cardiology) Prescott Gum, Collier Salina, MD as Consulting Physician (Cardiothoracic Surgery) 08/25/2017   CHIEF COMPLAINTS/PURPOSE OF CONSULTATION:  Liver masses concerning for hepatocellular carcinoma    Referred by Dr. Greggory Keen, interventional radiology  Oncology History   Cancer Staging Hepatocellular carcinoma [facility]) Staging form: Liver, AJCC 8th Edition - Clinical stage from 07/25/2017: Stage IIIA (cT3, cN0, cM0) - Signed by Truitt Merle, MD on 08/26/2017       Hepatocellular carcinoma ([place])   07/2017 Tumor Marker    AFP: 7672      07/15/2017 Imaging    IMPRESSION: ULTRASOUND ABDOMEN: Multiple hypoechoic liver masses, suspicious for multifocal hepatoma or hepatic metastatic disease. Abdomen MRI without and with contrast is recommended for further characterization. Cholelithiasis. No sonographic signs of acute cholecystitis or biliary dilatation. These results will be called to the ordering clinician or representative by the Radiologist Assistant, and communication documented in the PACS or zVision Dashboard.  ULTRASOUND HEPATIC ELASTOGRAPHY: Median hepatic shear wave velocity is calculated at 2.52 m/sec. Corresponding Metavir fibrosis score is Some F3 + F4. Risk of fibrosis is High. Follow-up: Follow up advised      07/25/2017 Imaging    IMPRESSION: At least 4 separate hepatic masses with characteristics of multifocal hepatoma, largest measuring 5.5 cm. No evidence of abdominal metastatic disease. Cholelithiasis.  No radiographic evidence of cholecystitis. Incidentally noted congenital small bowel malrotation. No evidence of volvulus or bowel obstruction.       07/25/2017 Cancer Staging   Staging form: Liver, AJCC 8th Edition - Clinical stage from 07/25/2017: Stage IIIA (cT3, cN0, cM0) - Signed by Truitt Merle, MD on 08/26/2017      08/25/2017 Initial Diagnosis    Hepatocellular carcinoma ([place])       HISTORY OF PRESENTING ILLNESS:  DAMARIOUS HOLTSCLAW 67 y.o. male is here because of suspected hepatocellular carcinoma. He presents with his wife. He was referred by interventional radiologist Dr. Greggory Keen. This gentleman with 5-10 year history of treatment naive chronic Hep C presented to new PCP to establish care. He had not had routine f/u for Hep C therefore was referred to liver clinic and seen by Roosevelt Locks, NP on 07/29/17.  He underwent evaluation with abdominal ultrasound with elastography which found several heterogeneous hypoechoic masses in the liver with the largest measuring 5.1 cm.  He subsequently underwent MRI of the abdomen with and without contrast which showed evidence of cirrhosis as well as a new mass measuring 5.5 x 4.6 cm in segment 4A with arterial phase enhancement, contrast washout, and delayed rim enhancement.  Smaller masses were seen in central aspect of segment 4, inferior right hepatic lobe in segment 5 measuring 2.4 x 1.6 cm and in segment 6 measuring 1 cm; imaging findings are characteristic of HCC.  AFP is 2431.  He was then referred to IR and medical oncology.   He reports a 40-year history of alcohol use, 1 bottle of wine per day, he recently quit in January 2019 on his own.  He continues to smoke 1/4 PPD with a 50-year smoking history.  Past medical history includes CAD status post coronary artery bypass graft in 2010, peripheral artery disease, hyperlipidemia, arthritis, and chronic hep C.  Recent serologies indicate exposure to hep B and no immunity  to hep A.  He has 1 healthy child, no family history of cancer.  He lives at home with his spouse, independent of ADLs, works as a Retail buyer for [employer].  Today he feels well with mild fatigue,  denies abdominal pain or signs of jaundice.  Has a normal appetite, no weight loss.  No recent fever, chills, nausea, vomiting, constipation, or diarrhea.   MEDICAL HISTORY:  Past Medical History:  Diagnosis Date  . Alcohol dependence ([place])   . Anemia   . Arthritis   . Cataract   . Cirrhosis ([place])   . Coronary artery disease due to lipid rich plaque   . Depression   . Hepatitis C    Has A and B also  . PAD (peripheral artery disease) ([place])    pt reports he has this and has chronic leg "soreness"  . Peripheral vascular disease ([place])    essentially normal circulations with two-vessel  runoff to the feet bilaterally  . Tobacco abuse     SURGICAL HISTORY: Past Surgical History:  Procedure Laterality Date  . CARDIAC CATHETERIZATION    . CORONARY ARTERY BYPASS GRAFT  2010   Triple bypass at [facility]  . DOPPLER ECHOCARDIOGRAPHY  2011  . IR RADIOLOGIST EVAL & MGMT  08/12/2017  . NM MYOVIEW LTD  2012    SOCIAL HISTORY: Social History   Socioeconomic History  . Marital status: Married    Spouse name: Not on file  . Number of children: 1  . Years of education: Not on file  . Highest education level: Not on file  Occupational History  . Occupation: Retail buyer    Comment: city of [place]  . Financial resource strain: Not on file  . Food insecurity:    Worry: Not on file    Inability: Not on file  . Transportation needs:    Medical: Not on file    Non-medical: Not on file  Tobacco Use  . Smoking status: Current Some Day Smoker    Packs/day: 0.25    Years: 52.00    Pack years: 13.00    Types: Cigarettes  . Smokeless tobacco: Never Used  . Tobacco comment: Is trying to stop smoking, still has one or two per day most days  Substance and Sexual Activity  . Alcohol use: No    Frequency: Never    Comment: quit 04/2017; previous 40 year history 1 wine bottle per day  . Drug use: No    Comment: occ  . Sexual activity: Yes  Lifestyle  . Physical activity:     Days per week: Not on file    Minutes per session: Not on file  . Stress: Not on file  Relationships  . Social connections:    Talks on phone: Not on file    Gets together: Not on file    Attends religious service: Not on file    Active member of club or organization: Not on file    Attends meetings of clubs or organizations: Not on file    Relationship status: Not on file  . Intimate partner violence:    Fear of current or ex partner: Not on file    Emotionally abused: Not on file    Physically abused: Not on file    Forced sexual activity: Not on file  Other Topics Concern  . Not on file  Social History Narrative  . Not on file    FAMILY HISTORY: Family History  Problem Relation Age of Onset  . Stroke Mother   . Colon cancer Neg Hx     ALLERGIES:  is allergic to statins.  MEDICATIONS:  Current Outpatient Medications  Medication Sig Dispense Refill  . aspirin 81 MG tablet Take 81 mg by mouth daily.    . Cholecalciferol (VITAMIN D3 PO) Take 125 mcg by mouth daily.    Marland Kitchen ibuprofen (ADVIL,MOTRIN) 400 MG tablet Take 400 mg by mouth every 6 (six) hours as needed.    . Liver Extract (LIVER PO) Take by mouth.    . Multiple Vitamin (MULTIVITAMIN WITH MINERALS) TABS tablet Take 1 tablet by mouth daily.     No current facility-administered medications for this visit.     REVIEW OF SYSTEMS:   Constitutional: Denies fevers, chills or abnormal night sweats (+) mild fatigue Eyes: Denies blurriness of vision, double vision or watery eyes Ears, nose, mouth, throat, and face: Denies mucositis or sore throat Respiratory: Denies cough, dyspnea or wheezes Cardiovascular: Denies palpitation, chest discomfort or lower extremity swelling Gastrointestinal:  Denies nausea, vomiting, constipation, diarrhea, abdominal pain, heartburn or change in bowel habits Skin: Denies abnormal skin rashes Lymphatics: Denies new lymphadenopathy or easy bruising Neurological:Denies numbness, tingling or  new weaknesses Behavioral/Psych: Mood is stable, no new changes  All other systems were reviewed with the patient and are negative.  PHYSICAL EXAMINATION: ECOG PERFORMANCE STATUS: 0 - Asymptomatic  Vitals:   08/25/17 1239  BP: 132/69  Pulse: 66  Resp: 18  Temp: (!) 97.5 F (36.4 C)  SpO2: 100%   Filed Weights   08/25/17 1239  Weight: 127 lb 12.8 oz (58 kg)    GENERAL:alert, no distress and comfortable SKIN: skin color, texture, turgor are normal, no rashes or significant lesions EYES: normal, conjunctiva are pink and non-injected, sclera clear OROPHARYNX:no exudate, no erythema and lips, buccal mucosa, and tongue normal  LYMPH:  no palpable cervical, supraclavicular, or axillary lymphadenopathy LUNGS: clear to auscultation with normal breathing effort HEART: regular rate & rhythm and no murmurs and no lower extremity edema ABDOMEN:abdomen soft, non-tender and normal bowel sounds.  No palpable hepatosplenomegaly Musculoskeletal:no cyanosis of digits and no clubbing  PSYCH: alert & oriented x 3 with fluent speech NEURO: no focal motor/sensory deficits  LABORATORY DATA:  I have reviewed the data as listed CBC Latest Ref Rng & Units 05/19/2017 04/03/2017 02/05/2015  WBC 3.4 - 10.8 x10E3/uL 4.5 2.9(L) 5.6  Hemoglobin 13.0 - 17.7 g/dL 12.9(L) 12.7(L) 14.8  Hematocrit 37.5 - 51.0 % 37.5 34.6(L) 42.5  Platelets 150 - 379 x10E3/uL 90(LL) 47(L) 79(L)   CMP Latest Ref Rng & Units 05/19/2017 04/03/2017 02/05/2015  Glucose 65 - 99 mg/dL 83 96 71  BUN 8 - 27 mg/dL 5(L) <5(L) 10  Creatinine 0.76 - 1.27 mg/dL 0.72(L) 0.69 0.79  Sodium 134 - 144 mmol/L 140 137 134(L)  Potassium 3.5 - 5.2 mmol/L 3.9 3.5 4.2  Chloride 96 - 106 mmol/L 103 105 98(L)  CO2 20 - 29 mmol/L 22 19(L) 24  Calcium 8.6 - 10.2 mg/dL 9.0 8.1(L) 8.6(L)  Total Protein 6.0 - 8.5 g/dL 7.3 - -  Total Bilirubin 0.0 - 1.2 mg/dL 0.9 - -  Alkaline Phos 39 - 117 IU/L 112 - -  AST 0 - 40 IU/L 52(H) - -  ALT 0 - 44 IU/L 35 - -     RADIOGRAPHIC STUDIES: I have personally reviewed the radiological images as listed and agreed with the findings in the report. Ir Radiologist [place]  Result Date: 08/12/2017 Please refer to notes tab for details about interventional procedure. (Op Note)   ASSESSMENT & PLAN: VERLON PISCHKE is a pleasant 67 year old AAF male with PMH significant for long term alcohol use, chronic Hep C, and liver cirrhosis presents for evaluation of multiple liver masses  1. Hepatocellular Carcinoma, multifocal, cT3N0Mx -We reviewed his medical record, labs, and imaging in detail with the patient and spouse.  He has at least 4 separate hepatic masses with the largest measuring 5.5 cm with typical imaging features of HCC.  AFP is elevated to 9476; it is almost certainly hepatocellular carcinoma.  Dr. Burr Medico does not recommend liver biopsy.  Due to the size and multifocal disease he is not a candidate for liver transplant or resection. His liver cancer is not likely curable without surgery. He met with IR Dr. Annamaria Boots who reviewed embolization options and recommends palliative Y90 radioembolization. Due to the size he is not a candidate for ablation.  -Will obtain CT chest to complete staging work up  -Will discuss his case at tumor board this week or next week.  -He will f/u with IR routinely and return to [place] with lab and f/u in 6 months   2. 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  -He has thrombocytopenia PLT 90K related to his liver disease but liver function remains normal -Due to concern for portal hypertension he has been referred to GI for endoscopy for variceal screening, appointment 09/22/17.  -No sign of ascites; will monitor  -We recommend he continue to abstain from alcohol to prevent further liver damage and try to quit smoking   3. Chronic Hepatitis C -Per Dawn, NP will defer hep C treatment until after [facility] treatment -She recommends Hep A and Hep B  vaccination series   PLAN: -CT chest to complete staging work up -Discuss case in tumor board this week or next week -Return to IR for Y90 -Lab and f/u in 6 months    Orders Placed This Encounter  Procedures  . CBC with Differential (Cancer Center Only)    Standing Status:   Standing    Number of Occurrences:   100    Standing Expiration Date:   08/26/2018  . CMP ([place] only)    Standing Status:   Standing    Number of Occurrences:   100    Standing Expiration Date:   08/26/2023  . AFP tumor marker    Standing Status:   Standing    Number of Occurrences:   100    Standing Expiration Date:   08/26/2023    All questions were answered. The patient knows to call the clinic with any problems, questions or concerns. I spent 45 minuntes counseling the patient face to face. The total time spent in the appointment was 60 minutes and more than 50% was on counseling.     Alla Feeling, NP 08/25/2017   Addendum  I have seen the patient, examined him. I agree with the assessment and and plan and have edited the notes.   Mr. Iannello is a 67 yo AAM with untreated HepC, heavy alcohol drinker,  liver cirrhosis, presented with screening discovered mutifocal HCC. His MRI is diagnostic for HCC, along with significantly elevated AFP, I do not think he needs tissue biopsy to confirm. Given the multifocal disease, largest 5.5cm, he is not a candidate for surgery or transplant. I will complete his staging with a CT  chest. If no distant metastasis, I will agree with Y90, with the goal of disease control, but not cure. He has been seen by IR Dr. Rolinda Roan will proceed with the treatment. I discussed the role of systemic therapy, such as sorafenib, Lenvatinib, immunotherpay and chemotherapy, which he will likely needed when he progressed after Y-90. I will see him back in 6 months for f/u. He knows to call us if he has concerns. I will present his case in GI tumor board. All questions were ansered.    Truitt Merle  08/25/2017   Addendum  Case was discussed in GI tumor board this morning. MRI findings and elevated AFP are diagnostic for HCC, no need tissue biopsy to confirm. We agree Y-90 is the best treatment option for now. Will re-evaluate his response to Y-90. If he has great response, Dr. Barry Dienes will evaluate his candidacy for surgery, he may need right port vein thrombosis before surgery.   Truitt Merle  09/02/2017

## 2017-09-02 ENCOUNTER — Other Ambulatory Visit: Payer: Self-pay | Admitting: Hematology

## 2017-09-08 ENCOUNTER — Other Ambulatory Visit (HOSPITAL_COMMUNITY): Payer: Self-pay | Admitting: Interventional Radiology

## 2017-09-08 DIAGNOSIS — C220 Liver cell carcinoma: Secondary | ICD-10-CM

## 2017-09-22 ENCOUNTER — Ambulatory Visit
Admission: RE | Admit: 2017-09-22 | Discharge: 2017-09-22 | Disposition: A | Discharge status: Home / Self Care | Payer: Medicare HMO | Source: Ambulatory Visit | Attending: Interventional Radiology | Admitting: Interventional Radiology

## 2017-09-22 ENCOUNTER — Ambulatory Visit: Payer: Medicare HMO | Admitting: Physician Assistant

## 2017-09-22 ENCOUNTER — Encounter: Payer: Self-pay | Admitting: Physician Assistant

## 2017-09-22 ENCOUNTER — Other Ambulatory Visit (INDEPENDENT_AMBULATORY_CARE_PROVIDER_SITE_OTHER): Payer: Medicare HMO

## 2017-09-22 VITALS — BP 128/62 | HR 66 | Ht 67.0 in | Wt 132.8 lb

## 2017-09-22 DIAGNOSIS — C220 Liver cell carcinoma: Secondary | ICD-10-CM | POA: Diagnosis not present

## 2017-09-22 DIAGNOSIS — K7460 Unspecified cirrhosis of liver: Secondary | ICD-10-CM | POA: Diagnosis not present

## 2017-09-22 HISTORY — PX: IR RADIOLOGIST EVAL & MGMT: IMG5224

## 2017-09-22 LAB — COMPREHENSIVE METABOLIC PANEL
ALT: 21 U/L (ref 0–53)
AST: 37 U/L (ref 0–37)
Albumin: 3.4 g/dL — ABNORMAL LOW (ref 3.5–5.2)
Alkaline Phosphatase: 114 U/L (ref 39–117)
BUN: 5 mg/dL — ABNORMAL LOW (ref 6–23)
CO2: 29 mEq/L (ref 19–32)
Calcium: 9.5 mg/dL (ref 8.4–10.5)
Chloride: 106 mEq/L (ref 96–112)
Creatinine, Ser: 0.78 mg/dL (ref 0.40–1.50)
GFR: 127.62 mL/min (ref >60.00)
Glucose, Bld: 106 mg/dL — ABNORMAL HIGH (ref 70–99)
Potassium: 3.8 mEq/L (ref 3.5–5.1)
Sodium: 142 mEq/L (ref 135–145)
Total Bilirubin: 0.6 mg/dL (ref 0.2–1.2)
Total Protein: 7.3 g/dL (ref 6.0–8.3)

## 2017-09-22 LAB — CBC WITH DIFFERENTIAL/PLATELET
Basophils Absolute: 0 10*3/uL (ref 0.0–0.1)
Basophils Relative: 0.7 % (ref 0.0–3.0)
Eosinophils Absolute: 0.3 10*3/uL (ref 0.0–0.7)
Eosinophils Relative: 6.6 % — ABNORMAL HIGH (ref 0.0–5.0)
HCT: 36.3 % — ABNORMAL LOW (ref 39.0–52.0)
Hemoglobin: 12.5 g/dL — ABNORMAL LOW (ref 13.0–17.0)
Lymphocytes Relative: 37.1 % (ref 12.0–46.0)
Lymphs Abs: 1.6 10*3/uL (ref 0.7–4.0)
MCHC: 34.4 g/dL (ref 30.0–36.0)
MCV: 101.1 fl — ABNORMAL HIGH (ref 78.0–100.0)
Monocytes Absolute: 0.5 10*3/uL (ref 0.1–1.0)
Monocytes Relative: 10.9 % (ref 3.0–12.0)
Neutro Abs: 2 10*3/uL (ref 1.4–7.7)
Neutrophils Relative %: 44.7 % (ref 43.0–77.0)
Platelets: 100 10*3/uL — ABNORMAL LOW (ref 150.0–400.0)
RBC: 3.59 Mil/uL — ABNORMAL LOW (ref 4.22–5.81)
RDW: 13.9 % (ref 11.5–15.5)
WBC: 4.4 10*3/uL (ref 4.0–10.5)

## 2017-09-22 LAB — PROTIME-INR
INR: 1.1 ratio — ABNORMAL HIGH (ref 0.8–1.0)
Prothrombin Time: 13.4 s — ABNORMAL HIGH (ref 9.6–13.1)

## 2017-09-22 NOTE — Progress Notes (Signed)
Patient ID: Zachary Steele, male   DOB: November 26, 1950, 67 y.o.   MRN: [id number]       Chief Complaint:  Hepatocellular carcinoma, multifocal.  Nonoperative candidate.  Referring Physician(s): Devany Aja  History of Present Illness: Zachary Steele is a 67 y.o. male with chronic hepatitis C and previous alcoholism.  Surveillance imaging confirmed several hepatic lesions largest measuring 5.1 cm with evidence of cirrhosis.  Alpha-fetoprotein was elevated at 2000 431.  MRI demonstrated at least 4 hepatic lesions compatible with multifocal hepatocellular carcinoma.  Largest lesion is in segment 4A measuring 5.5 cm.  No biliary dilatation or extrahepatic disease.  No significant adenopathy appreciated by imaging.  He remains asymptomatic.  He continues to work full-time.  No significant abdominal pain, flank pain or epigastric pain.  No signs of liver failure or jaundice.  He no longer drinks alcohol.  He does continue to smoke daily.  Current functional status is excellent.  He understands he is a nonoperative candidate.  Past Medical History:  Diagnosis Date  . Alcohol dependence ([place])   . Anemia   . Arthritis   . Cataract   . Cirrhosis ([place])   . Coronary artery disease due to lipid rich plaque   . Depression   . Hepatitis C    Has A and B also  . PAD (peripheral artery disease) ([place])    pt reports he has this and has chronic leg "soreness"  . Peripheral vascular disease ([place])    essentially normal circulations with two-vessel  runoff to the feet bilaterally  . Tobacco abuse     Past Surgical History:  Procedure Laterality Date  . CARDIAC CATHETERIZATION    . CORONARY ARTERY BYPASS GRAFT  2010   Triple bypass at [facility]  . DOPPLER ECHOCARDIOGRAPHY  2011  . IR RADIOLOGIST EVAL & MGMT  08/12/2017  . NM MYOVIEW LTD  2012    Allergies: Statins  Medications: Prior to Admission medications   Medication Sig Start Date End Date Taking? Authorizing Provider  acetaminophen (TYLENOL)  500 MG tablet Take 1,000 mg by mouth every 6 (six) hours as needed.   Yes [provider]  aspirin 81 MG tablet Take 81 mg by mouth daily.   Yes [provider]  Cholecalciferol (VITAMIN D3 PO) Take 125 mcg by mouth daily.   Yes [provider]  Liver Extract (LIVER PO) Take by mouth.   Yes [provider]  Multiple Vitamin (MULTIVITAMIN WITH MINERALS) TABS tablet Take 1 tablet by mouth daily.   Yes [provider]  ibuprofen (ADVIL,MOTRIN) 400 MG tablet Take 400 mg by mouth every 6 (six) hours as needed.    [provider]     Family History  Problem Relation Age of Onset  . Stroke Mother   . Colon cancer Neg Hx     Social History   Socioeconomic History  . Marital status: Married    Spouse name: Not on file  . Number of children: 1  . Years of education: Not on file  . Highest education level: Not on file  Occupational History  . Occupation: Retail buyer    Comment: city of [place]  . Financial resource strain: Not on file  . Food insecurity:    Worry: Not on file    Inability: Not on file  . Transportation needs:    Medical: Not on file    Non-medical: Not on file  Tobacco Use  . Smoking status: Current Some  Day Smoker    Packs/day: 0.25    Years: 52.00    Pack years: 13.00    Types: Cigarettes  . Smokeless tobacco: Never Used  . Tobacco comment: Is trying to stop smoking, still has one or two per day most days  Substance and Sexual Activity  . Alcohol use: No    Frequency: Never    Comment: quit 04/2017; previous 40 year history 1 wine bottle per day  . Drug use: No    Comment: occ  . Sexual activity: Yes  Lifestyle  . Physical activity:    Days per week: Not on file    Minutes per session: Not on file  . Stress: Not on file  Relationships  . Social connections:    Talks on phone: Not on file    Gets together: Not on file    Attends religious service: Not on file    Active member of  club or organization: Not on file    Attends meetings of clubs or organizations: Not on file    Relationship status: Not on file  Other Topics Concern  . Not on file  Social History Narrative  . Not on file    ECOG Status: 1 - Symptomatic but completely ambulatory  Review of Systems: A 12 point ROS discussed and pertinent positives are indicated in the HPI above.  All other systems are negative.  Review of Systems  Vital Signs: BP 119/64   Pulse (!) 58   Temp 97.8 F (36.6 C) (Oral)   Resp 14   Ht 5\' 7"  (1.702 m)   Wt 132 lb (59.9 kg)   SpO2 100%   BMI 20.67 kg/m   Physical Exam  Constitutional: He is oriented to person, place, and time.  Very thin male in no distress.  Eyes: Conjunctivae are normal. No scleral icterus.  Cardiovascular: Normal rate, regular rhythm and normal heart sounds.  Pulmonary/Chest: Effort normal and breath sounds normal.  Abdominal: Soft. Bowel sounds are normal.  Musculoskeletal: Normal range of motion.  Neurological: He is alert and oriented to person, place, and time.  Skin: No rash noted. No erythema.    Imaging: No results found.  Labs:  CBC: Recent Labs    04/03/17 1727 05/19/17 1010 09/22/17 1043  WBC 2.9* 4.5 4.4  HGB 12.7* 12.9* 12.5*  HCT 34.6* 37.5 36.3*  PLT 47* 90* 100.0*    COAGS: Recent Labs    09/22/17 1043  INR 1.1*    BMP: Recent Labs    04/03/17 1538 05/19/17 1010 09/22/17 1043  NA 137 140 142  K 3.5 3.9 3.8  CL 105 103 106  CO2 19* 22 29  GLUCOSE 96 83 106*  BUN <5* 5* 5*  CALCIUM 8.1* 9.0 9.5  CREATININE 0.69 0.72* 0.78  GFRNONAA >60 97  --   GFRAA >60 112  --     LIVER FUNCTION TESTS: Recent Labs    05/19/17 1010 09/22/17 1043  BILITOT 0.9 0.6  AST 52* 37  ALT 35 21  ALKPHOS 112 114  PROT 7.3 7.3  ALBUMIN 3.1* 3.4*    TUMOR MARKERS: No results for input(s): AFPTM, CEA, CA199, CHROMGRNA in the last 8760 hours.  Assessment and Plan:  Asymptomatic multifocal hepatocellular  carcinoma in the setting of hepatitis C and cirrhosis.  He is a nonoperative candidate.  MR imaging findings are consistent with hepatocellular carcinoma.  He has been evaluated by medical oncology who agree with Y 90 radioembolization.  Plan: The  Y 90 radio embolization procedure was reviewed in detail including the planning phase, treatment day, expected recovery, outcomes, continued surveillance, and goals.  He understands this is palliative management and his disease is not curable.  He would like to proceed with Y 90 radial embolization.  This will be scheduled at [facility] in the next few weeks.  Thank you for this interesting consult.  I greatly enjoyed meeting Zachary Steele and look forward to participating in their care.  A copy of this report was sent to the requesting provider on this date.  Electronically Signed: Greggory Keen 09/22/2017, 1:17 PM   I spent a total of    25 Minutes in face to face in clinical consultation, greater than 50% of which was counseling/coordinating care for this patient with hepatocellular carcinoma.

## 2017-09-22 NOTE — Progress Notes (Signed)
Subjective:    Patient ID: Zachary Steele, male    DOB: 1950-09-15, 67 y.o.   MRN: [id number]  HPI Zachary Steele is a pleasant 67 year old African-American male known previously to Dr. Ardis Hughs from colonoscopies.  He is referred today by Roosevelt Locks  NP for variceal screening.    Patient was last seen here in 2017 when he had colonoscopy pertinent only for diverticulosis. Patient has history of hepatitis C and was recently referred to the hepatitis clinic for consideration of treatment and underwent upper abdominal ultrasound in April 2019 that showed multiple hypoechoic liver masses suspicious for multifocal hepatoma or metastatic disease.  The largest of these was 5.5 cm.  He was also noted to have Coley lithiasis. Subsequent AFP level markedly elevated at 2431. Subsequent MRI showed a cirrhotic liver, with a new mass seen in segment 4A showing arterial phase enhancement contrast washout and delayed rim enhancement characteristic of hepatoma this measured 5.5 x 4.6 cm.  There are also smaller masses with the same imaging characteristics.  Felt to be at least 4 separate hepatic masses consistent with multifocal hepatoma.  Also incidentally noted congenital small bowel malrotation . Patient has been evaluated by oncology, and is being followed by Dr.Feng..  He is not a candidate for ablation but will likely be candidate for palliative radio embolization and has upcoming appointment with interventional radiology.  He also has chest CT pending.  Patient has prior history of long-term EtOH use drinking at least a bottle of wine per day x40 years.  He stopped drinking alcohol in January 2019 and says is been very successful thus far.  Is also a smoker and has been trying hard to stop. Fortunately he has not been having any significant abdominal pain.  His appetite has been good and his weight thus far has been stable. Test labs were done in February 2019 and were reviewed, at that time LFTs normal, platelet  count 90,000.  Review of Systems Pertinent positive and negative review of systems were noted in the above HPI section.  All other review of systems was otherwise negative.  Outpatient Encounter Medications as of 09/22/2017  Medication Sig  . aspirin 81 MG tablet Take 81 mg by mouth daily.  . Cholecalciferol (VITAMIN D3 PO) Take 125 mcg by mouth daily.  Marland Kitchen ibuprofen (ADVIL,MOTRIN) 400 MG tablet Take 400 mg by mouth every 6 (six) hours as needed.  . Liver Extract (LIVER PO) Take by mouth.  . Multiple Vitamin (MULTIVITAMIN WITH MINERALS) TABS tablet Take 1 tablet by mouth daily.   No facility-administered encounter medications on file as of 09/22/2017.    Allergies  Allergen Reactions  . Statins Other (See Comments)    Alcoholic cirrhosis, cardiology has declined to start statin   Patient Active Problem List   Diagnosis Date Noted  . Hepatocellular carcinoma ([place]) 08/25/2017  . Smoker 11/09/2015  . Hx of CABG 09/13/2014  . Peripheral arterial disease ([place]) 09/13/2014  . Cirrhosis ([place])   . Alcohol addiction ([place]) 09/18/2011  . Abnormal liver enzymes 09/18/2011  . Abnormal platelets ([place]) 09/18/2011  . Pancytopenia ([place]) 09/18/2011    Class: Chronic  . UTI (urinary tract infection) 09/18/2011    Class: Acute  . Substance induced mood disorder ([place]) 09/18/2011   Social History   Socioeconomic History  . Marital status: Married    Spouse name: Not on file  . Number of children: 1  . Years of education: Not on file  . Highest education level: Not on  file  Occupational History  . Occupation: Retail buyer    Comment: city of [place]  . Financial resource strain: Not on file  . Food insecurity:    Worry: Not on file    Inability: Not on file  . Transportation needs:    Medical: Not on file    Non-medical: Not on file  Tobacco Use  . Smoking status: Current Some Day Smoker    Packs/day: 0.25    Years: 52.00    Pack years: 13.00    Types: Cigarettes  .  Smokeless tobacco: Never Used  . Tobacco comment: Is trying to stop smoking, still has one or two per day most days  Substance and Sexual Activity  . Alcohol use: No    Frequency: Never    Comment: quit 04/2017; previous 40 year history 1 wine bottle per day  . Drug use: No    Comment: occ  . Sexual activity: Yes  Lifestyle  . Physical activity:    Days per week: Not on file    Minutes per session: Not on file  . Stress: Not on file  Relationships  . Social connections:    Talks on phone: Not on file    Gets together: Not on file    Attends religious service: Not on file    Active member of club or organization: Not on file    Attends meetings of clubs or organizations: Not on file    Relationship status: Not on file  . Intimate partner violence:    Fear of current or ex partner: Not on file    Emotionally abused: Not on file    Physically abused: Not on file    Forced sexual activity: Not on file  Other Topics Concern  . Not on file  Social History Narrative  . Not on file    Zachary Steele family history includes Stroke in his mother.      Objective:    Vitals:   09/22/17 0953  BP: 128/62  Pulse: 66    Physical Exam; well-developed older African-American male in no acute distress, pleasant blood pressure 128                                                                                           over 62, pulse 66, height 5 foot 7, weight 132, BMI 20.8.  HEENT; nontraumatic normocephalic EOMI PERRLA sclera anicteric, Oropharynx clear.  Cardiovascular ;regular rate and rhythm with S1-S2 is a sternal incisional scar.  Pulmonary ;clear bilaterally, Abdomen ;soft, some mild tenderness in the right upper quadrant no definite palpable mass or splenomegaly, no fluid wave, bowel sounds present, Rectal ;exam not done, Extremities; no clubbing cyanosis or edema skin warm and dry, Neuro psych ;alert and oriented, grossly nonfocal mood and affect appropriate       Assessment &  Plan:   #50 67 year old African-American male who was recently diagnosed with stage IIIa multifocal hepatoma, with one large dominant mass, and is referred today for EGD for variceal screening. #2 new diagnosis of cirrhosis, decompensated with hepatoma, thrombocytopenia. #3 history of hepatitis C, not treated #  4.  Long history of EtOH abuse-inactive since January 2019 #5.  Coronary artery disease status post CABG 2010 #6.  Peripheral arterial disease #7.  Arthritis #8.  Cholelithiasis   #9 diverticulosis  Plan; Patient will be scheduled for upper endoscopy with Dr. Ardis Hughs.  Procedure was discussed in detail with the patient including indications risks and benefits and he is agreeable to proceed.  We will check CBC, C met pro time/INR and AFP as these were recently ordered and not done. Patient was concerned about taking pain medication for his arthritis, he was advised to avoid NSAIDs and told that low-dose Tylenol no more than 2 g/day would be acceptable. Patient was commended for stopping EtOH strongly encouraged to continue EtOH avoidance.  Kemaria Dedic S Milly Goggins PA-C 09/22/2017   Cc: [name], NP-C

## 2017-09-22 NOTE — Patient Instructions (Signed)
Your provider has requested that you go to the basement level for lab work before leaving today. Press "B" on the elevator. The lab is located at the first door on the left as you exit the elevator.  You have been scheduled for an endoscopy. Please follow written instructions given to you at your visit today. If you use inhalers (even only as needed), please bring them with you on the day of your procedure. Your physician has requested that you go to www.startemmi.com and enter the access code given to you at your visit today. This web site gives a general overview about your procedure. However, you should still follow specific instructions given to you by our office regarding your preparation for the procedure.  

## 2017-09-23 LAB — AFP TUMOR MARKER: AFP-Tumor Marker: 5417.1 ng/mL — ABNORMAL HIGH (ref <6.1)

## 2017-09-23 NOTE — Progress Notes (Signed)
I agree with the above note, plan. Yesterdays labs show MELD is 7.

## 2017-09-25 ENCOUNTER — Other Ambulatory Visit (HOSPITAL_COMMUNITY): Payer: Self-pay | Admitting: Interventional Radiology

## 2017-09-25 DIAGNOSIS — C220 Liver cell carcinoma: Secondary | ICD-10-CM

## 2017-10-26 ENCOUNTER — Other Ambulatory Visit: Payer: Self-pay | Admitting: Student

## 2017-10-27 ENCOUNTER — Encounter (HOSPITAL_COMMUNITY)
Admission: RE | Admit: 2017-10-27 | Discharge: 2017-10-27 | Disposition: A | Discharge status: Home / Self Care | Payer: Medicare HMO | Source: Ambulatory Visit | Attending: Interventional Radiology | Admitting: Interventional Radiology

## 2017-10-27 ENCOUNTER — Other Ambulatory Visit (HOSPITAL_COMMUNITY): Payer: Self-pay | Admitting: Interventional Radiology

## 2017-10-27 ENCOUNTER — Other Ambulatory Visit: Payer: Self-pay

## 2017-10-27 ENCOUNTER — Ambulatory Visit (HOSPITAL_COMMUNITY)
Admission: RE | Admit: 2017-10-27 | Discharge: 2017-10-27 | Disposition: A | Discharge status: Home / Self Care | Payer: Medicare HMO | Source: Ambulatory Visit | Attending: Interventional Radiology | Admitting: Interventional Radiology

## 2017-10-27 ENCOUNTER — Encounter (HOSPITAL_COMMUNITY): Payer: Self-pay

## 2017-10-27 DIAGNOSIS — I739 Peripheral vascular disease, unspecified: Secondary | ICD-10-CM | POA: Insufficient documentation

## 2017-10-27 DIAGNOSIS — K7460 Unspecified cirrhosis of liver: Secondary | ICD-10-CM | POA: Diagnosis not present

## 2017-10-27 DIAGNOSIS — C220 Liver cell carcinoma: Secondary | ICD-10-CM | POA: Diagnosis not present

## 2017-10-27 DIAGNOSIS — F17210 Nicotine dependence, cigarettes, uncomplicated: Secondary | ICD-10-CM | POA: Insufficient documentation

## 2017-10-27 DIAGNOSIS — B1920 Unspecified viral hepatitis C without hepatic coma: Secondary | ICD-10-CM | POA: Insufficient documentation

## 2017-10-27 DIAGNOSIS — I2583 Coronary atherosclerosis due to lipid rich plaque: Secondary | ICD-10-CM | POA: Diagnosis not present

## 2017-10-27 DIAGNOSIS — I2510 Atherosclerotic heart disease of native coronary artery without angina pectoris: Secondary | ICD-10-CM | POA: Diagnosis not present

## 2017-10-27 DIAGNOSIS — Z7982 Long term (current) use of aspirin: Secondary | ICD-10-CM | POA: Insufficient documentation

## 2017-10-27 DIAGNOSIS — Z951 Presence of aortocoronary bypass graft: Secondary | ICD-10-CM | POA: Diagnosis not present

## 2017-10-27 DIAGNOSIS — F329 Major depressive disorder, single episode, unspecified: Secondary | ICD-10-CM | POA: Insufficient documentation

## 2017-10-27 DIAGNOSIS — M1990 Unspecified osteoarthritis, unspecified site: Secondary | ICD-10-CM | POA: Diagnosis not present

## 2017-10-27 HISTORY — PX: IR ANGIOGRAM SELECTIVE EACH ADDITIONAL VESSEL: IMG667

## 2017-10-27 HISTORY — PX: IR ANGIOGRAM VISCERAL SELECTIVE: IMG657

## 2017-10-27 HISTORY — PX: IR US GUIDE VASC ACCESS RIGHT: IMG2390

## 2017-10-27 HISTORY — PX: IR EMBO ARTERIAL NOT HEMORR HEMANG INC GUIDE ROADMAPPING: IMG5448

## 2017-10-27 LAB — CBC
HCT: 38.8 % — ABNORMAL LOW (ref 39.0–52.0)
Hemoglobin: 13.3 g/dL (ref 13.0–17.0)
MCH: 32.9 pg (ref 26.0–34.0)
MCHC: 34.3 g/dL (ref 30.0–36.0)
MCV: 96 fL (ref 78.0–100.0)
Platelets: 107 10*3/uL — ABNORMAL LOW (ref 150–400)
RBC: 4.04 MIL/uL — ABNORMAL LOW (ref 4.22–5.81)
RDW: 13.9 % (ref 11.5–15.5)
WBC: 5.3 10*3/uL (ref 4.0–10.5)

## 2017-10-27 LAB — COMPREHENSIVE METABOLIC PANEL
ALT: 28 U/L (ref 0–44)
AST: 52 U/L — ABNORMAL HIGH (ref 15–41)
Albumin: 3.4 g/dL — ABNORMAL LOW (ref 3.5–5.0)
Alkaline Phosphatase: 108 U/L (ref 38–126)
Anion gap: 10 (ref 5–15)
BUN: 8 mg/dL (ref 8–23)
CO2: 24 mmol/L (ref 22–32)
Calcium: 9.4 mg/dL (ref 8.9–10.3)
Chloride: 107 mmol/L (ref 98–111)
Creatinine, Ser: 0.65 mg/dL (ref 0.61–1.24)
GFR calc Af Amer: >60 mL/min (ref >60)
GFR calc non Af Amer: >60 mL/min (ref >60)
Glucose, Bld: 88 mg/dL (ref 70–99)
Potassium: 4.1 mmol/L (ref 3.5–5.1)
Sodium: 141 mmol/L (ref 135–145)
Total Bilirubin: 0.5 mg/dL (ref 0.3–1.2)
Total Protein: 7.7 g/dL (ref 6.5–8.1)

## 2017-10-27 LAB — PROTIME-INR
INR: 1.09
Prothrombin Time: 14 seconds (ref 11.4–15.2)

## 2017-10-27 LAB — APTT: aPTT: 30 seconds (ref 24–36)

## 2017-10-27 MED ORDER — MIDAZOLAM HCL 2 MG/2ML IJ SOLN
INTRAMUSCULAR | Status: AC | PRN
  Administered 2017-10-27: 0.5 mg via INTRAVENOUS
  Administered 2017-10-27 (×3): 1 mg via INTRAVENOUS

## 2017-10-27 MED ORDER — IOPAMIDOL (ISOVUE-300) INJECTION 61%
INTRAVENOUS | Status: AC
  Administered 2017-10-27: 50 mL via INTRA_ARTERIAL
  Filled 2017-10-27: qty 300

## 2017-10-27 MED ORDER — SODIUM CHLORIDE 0.9 % IV SOLN
INTRAVENOUS | Status: DC
  Administered 2017-10-27: 08:00:00 via INTRAVENOUS

## 2017-10-27 MED ORDER — PIPERACILLIN-TAZOBACTAM 3.375 G IVPB
INTRAVENOUS | Status: AC
  Filled 2017-10-27: qty 50

## 2017-10-27 MED ORDER — MIDAZOLAM HCL 2 MG/2ML IJ SOLN
INTRAMUSCULAR | Status: AC
  Filled 2017-10-27: qty 6

## 2017-10-27 MED ORDER — IOPAMIDOL (ISOVUE-300) INJECTION 61%
100.0000 mL | Freq: Once | INTRAVENOUS | Status: AC | PRN
  Administered 2017-10-27: 60 mL via INTRA_ARTERIAL

## 2017-10-27 MED ORDER — PIPERACILLIN-TAZOBACTAM 3.375 G IVPB 30 MIN
3.3750 g | Freq: Once | INTRAVENOUS | Status: AC
  Administered 2017-10-27: 3.375 g via INTRAVENOUS
  Filled 2017-10-27: qty 50

## 2017-10-27 MED ORDER — SODIUM CHLORIDE 0.9 % IV SOLN: INTRAVENOUS | Status: DC

## 2017-10-27 MED ORDER — IOPAMIDOL (ISOVUE-300) INJECTION 61%
100.0000 mL | Freq: Once | INTRAVENOUS | Status: AC | PRN
  Administered 2017-10-27: 50 mL via INTRA_ARTERIAL

## 2017-10-27 MED ORDER — LIDOCAINE HCL 1 % IJ SOLN
INTRAMUSCULAR | Status: AC
  Filled 2017-10-27: qty 20

## 2017-10-27 MED ORDER — FENTANYL CITRATE (PF) 100 MCG/2ML IJ SOLN
INTRAMUSCULAR | Status: AC | PRN
  Administered 2017-10-27: 50 ug via INTRAVENOUS
  Administered 2017-10-27 (×4): 25 ug via INTRAVENOUS

## 2017-10-27 MED ORDER — IOPAMIDOL (ISOVUE-300) INJECTION 61%
100.0000 mL | Freq: Once | INTRAVENOUS | Status: AC | PRN
Start: 2017-10-27 — End: 2017-10-27
  Administered 2017-10-27: 55 mL via INTRA_ARTERIAL

## 2017-10-27 MED ORDER — FENTANYL CITRATE (PF) 100 MCG/2ML IJ SOLN
INTRAMUSCULAR | Status: AC
  Filled 2017-10-27: qty 6

## 2017-10-27 MED ORDER — LIDOCAINE HCL 1 % IJ SOLN
INTRAMUSCULAR | Status: AC | PRN
  Administered 2017-10-27: 5 mL

## 2017-10-27 MED ORDER — PANTOPRAZOLE SODIUM 40 MG PO TBEC
40.0000 mg | DELAYED_RELEASE_TABLET | Freq: Every day | ORAL | Status: DC
  Filled 2017-10-27 (×2): qty 1

## 2017-10-27 NOTE — Sedation Documentation (Signed)
5 french Exoseal closure device deployed.

## 2017-10-27 NOTE — Discharge Instructions (Signed)
Moderate Conscious Sedation, Adult, Care After These instructions provide you with information about caring for yourself after your procedure. Your health care provider may also give you more specific instructions. Your treatment has been planned according to current medical practices, but problems sometimes occur. Call your health care provider if you have any problems or questions after your procedure. What can I expect after the procedure? After your procedure, it is common:  To feel sleepy for several hours.  To feel clumsy and have poor balance for several hours.  To have poor judgment for several hours.  To vomit if you eat too soon.  Follow these instructions at home: For at least 24 hours after the procedure:   Do not: ? Participate in activities where you could fall or become injured. ? Drive. ? Use heavy machinery. ? Drink alcohol. ? Take sleeping pills or medicines that cause drowsiness. ? Make important decisions or sign legal documents. ? Take care of children on your own.  Rest. Eating and drinking  Follow the diet recommended by your health care provider.  If you vomit: ? Drink water, juice, or soup when you can drink without vomiting. ? Make sure you have little or no nausea before eating solid foods. General instructions  Have a responsible adult stay with you until you are awake and alert.  Take over-the-counter and prescription medicines only as told by your health care provider.  If you smoke, do not smoke without supervision.  Keep all follow-up visits as told by your health care provider. This is important. Contact a health care provider if:  You keep feeling nauseous or you keep vomiting.  You feel light-headed.  You develop a rash.  You have a fever. Get help right away if:  You have trouble breathing. This information is not intended to replace advice given to you by your health care provider. Make sure you discuss any questions you have  with your health care provider. Document Released: 01/19/2013 Document Revised: 09/03/2015 Document Reviewed: 07/21/2015 Elsevier Interactive Patient Education  2018 [employer].    Hepatic Artery Radioembolization, Care After This sheet gives you information about how to care for yourself after your procedure. Your health care provider may also give you more specific instructions. If you have problems or questions, contact your health care provider. What can I expect after the procedure? After the procedure, it is common to have:  A slight fever for 1-2 weeks. If your fever gets worse, tell your health care provider.  Fatigue.  Loss of appetite. This should gradually improve after about 1 week.  Abdominal pain on your right side.  Soreness and tenderness in your groin area where the needle and catheter were placed (puncture site).  Follow these instructions at home: Puncture site care  Follow instructions from your health care provider about how to take care of the puncture site. Make sure you: ? Wash your hands with soap and water before you change your bandage (dressing). If soap and water are not available, use hand sanitizer. ? Change your dressing as told by your health care provider. ? Leave stitches (sutures), skin glue, or adhesive strips in place. These skin closures may need to stay in place for 2 weeks or longer. If adhesive strip edges start to loosen and curl up, you may trim the loose edges. Do not remove adhesive strips completely unless your health care provider tells you to do that.  Check your puncture site every day for signs of infection. Check  for: ? More redness, swelling, or pain. ? More fluid or blood. ? Warmth. ? Pus or a bad smell. Activity  Rest and return to your normal activities as told by your health care provider. Ask your health care provider what activities are safe for you.  Do not drive for 24 hours after the procedure if you were given a  medicine to help you relax (sedative).  Do not lift anything that is heavier than 10 lb (4.5 kg) until your health care provider says that it is safe. Medicines  Take over-the-counter and prescription medicines only as told by your health care provider.  Do not drive or use heavy machinery while taking prescription pain medicine. Radiation precautions  For up to a week after your procedure, there will be a small amount of radioactivity near your liver. This is not especially dangerous to other people. However, you should follow these precautions for 7 days: ? Do not come in close contact with people. ? Do not sleep in the same bed as someone else. ? Do not hold children or babies. ? Do not have contact with pregnant women. General instructions   To prevent or treat constipation while you are taking prescription pain medicine, your health care provider may recommend that you: ? Drink enough fluid to keep your urine clear or pale yellow. ? Take over-the-counter or prescription medicines. ? Eat foods that are high in fiber, such as fresh fruits and vegetables, whole grains, and beans. ? Limit foods that are high in fat and processed sugars, such as fried and sweet foods.  Eat frequent small meals until your appetite returns. Follow instructions from your health care provider about eating or drinking restrictions.  Do not take baths, swim, or use a hot tub until your health care provider approves. You may take showers. Wash your puncture site with mild soap and water and pat the area dry.  Wear compression stockings as told by your health care provider. These stockings help to prevent blood clots and reduce swelling in your legs.  Keep all follow-up visits as told by your health care provider. This is important. You may need to have blood tests and imaging tests done. Contact a health care provider if:  You have more redness, swelling, or pain around your puncture site.  You have more  fluid or blood coming from your puncture site.  Your puncture site feels warm to the touch.  You have pus or a bad smell coming from your puncture site.  You have pain that: ? Gets worse. ? Does not get better with medicine. ? Feels like very bad heartburn. ? Is in the middle of your abdomen, above your belly button.  Your skin and the white parts of your eyes turn yellow (jaundice).  The color of your urine changes to dark brown.  The color of your stool changes to light yellow.  Your abdominal measurement (girth) increases in a short period of time.  You gain more than 5 lb (2.3 kg) in a short period of time. Get help right away if:  You have a fever that lasts longer than 2 weeks or is higher than what your health care provider told you to expect.  You develop any of the following in your legs: ? Pain. ? Swelling. ? Skin that is cold or pale or turns blue.  You have chest pain.  You have blood in your vomit, saliva, or stool.  You have trouble breathing. This information is not  intended to replace advice given to you by your health care provider. Make sure you discuss any questions you have with your health care provider. Document Released: 04/05/2013 Document Revised: 12/29/2015 Document Reviewed: 12/29/2015 Elsevier Interactive Patient Education  2018 [place] FOLLOWING INSTRUCTIONS ARE NOT FOR TODAY BUT FOR YOUR NEXT APPT WITH [place].   Post Y-90 Radioembolization Discharge Instructions  You have been given a radioactive material during your procedure.  While it is safe for you to be discharged home from the hospital, you need to proceed directly home.    Do not use public transportation, including air travel, lasting more than 2 hours for 1 week.  Avoid crowded public places for 1 week.  Adult visitors should try to avoid close contact with you for 1 week.    Children and pregnant females should not visit or have close contact with you for 1  week.  Items that you touch are not radioactive.  Do not sleep in the same bed as your partner for 1 week, and a condom should be used for sexual activity during the first 24 hours.  Your blood may be radioactive and caution should be used if any bleeding occurs during the recovery period.  Body fluids may be radioactive for 24 hours.  Wash your hands after voiding.  Men should sit to urinate.  Dispose of any soiled materials (flush down toilet or place in trash at home) during the first day.  Drink 6 to 8 glasses of fluids per day for 5 days to hydrate yourself.  If you need to see a doctor during the first week, you must let them know that you were treated with yttrium-90 microspheres, and will be slightly radioactive.  They can call Interventional Radiology [phone] with any questions.

## 2017-10-27 NOTE — H&P (Signed)
Chief Complaint: Patient was seen in consultation today for hepatocellular carcinoma  Referring Physician(s): Shick,Michael  Supervising Physician: Daryll Brod  Patient Status: [facility] - Out-pt  History of Present Illness: Zachary Steele is a 66 y.o. male with history of cirrhosis, CAD, depression, Hep C who was recently found to have hepatic lesions by [place].  Patient met with Dr. Annamaria Boots in Interventional Radiology clinic 08/12/17 to discuss potential treatment options.  Patient is not a surgical candidate. Patient and wife elected to proceed with Y90 procedure.   Patient presents to radiology department for procedure today. He remains asymptomatic; denies fever, chills, cough, shortness of breath, abdominal pain, nausea, dysuria.   He has been NPO. He does not take blood thinners.   Past Medical History:  Diagnosis Date  . Alcohol dependence ([place])   . Anemia   . Arthritis   . Cataract   . Cirrhosis ([place])   . Coronary artery disease due to lipid rich plaque   . Depression   . Hepatitis C    Has A and B also  . PAD (peripheral artery disease) ([place])    pt reports he has this and has chronic leg "soreness"  . Peripheral vascular disease ([place])    essentially normal circulations with two-vessel  runoff to the feet bilaterally  . Tobacco abuse     Past Surgical History:  Procedure Laterality Date  . CARDIAC CATHETERIZATION    . CORONARY ARTERY BYPASS GRAFT  2010   Triple bypass at [facility]  . DOPPLER ECHOCARDIOGRAPHY  2011  . IR RADIOLOGIST EVAL & MGMT  08/12/2017  . IR RADIOLOGIST EVAL & MGMT  09/22/2017  . NM MYOVIEW LTD  2012    Allergies: Statins  Medications: Prior to Admission medications   Medication Sig Start Date End Date Taking? Authorizing Provider  acetaminophen (TYLENOL) 500 MG tablet Take 1,000 mg by mouth every 6 (six) hours as needed.   Yes [provider]  aspirin 81 MG tablet Take 81 mg by mouth daily.   Yes [provider]    Cholecalciferol (VITAMIN D3 PO) Take 125 mcg by mouth daily.   Yes [provider]  Liver Extract (LIVER PO) Take by mouth.   Yes [provider]  Multiple Vitamin (MULTIVITAMIN WITH MINERALS) TABS tablet Take 1 tablet by mouth daily.   Yes [provider]  ibuprofen (ADVIL,MOTRIN) 400 MG tablet Take 400 mg by mouth every 6 (six) hours as needed.    [provider]     Family History  Problem Relation Age of Onset  . Stroke Mother   . Colon cancer Neg Hx     Social History   Socioeconomic History  . Marital status: Married    Spouse name: Not on file  . Number of children: 1  . Years of education: Not on file  . Highest education level: Not on file  Occupational History  . Occupation: Retail buyer    Comment: city of [place]  . Financial resource strain: Not on file  . Food insecurity:    Worry: Not on file    Inability: Not on file  . Transportation needs:    Medical: Not on file    Non-medical: Not on file  Tobacco Use  . Smoking status: Current Some Day Smoker    Packs/day: 0.25    Years: 52.00    Pack years: 13.00    Types: Cigarettes  . Smokeless tobacco: Never Used  . Tobacco  comment: Is trying to stop smoking, still has one or two per day most days  Substance and Sexual Activity  . Alcohol use: No    Frequency: Never    Comment: quit 04/2017; previous 40 year history 1 wine bottle per day  . Drug use: No    Comment: occ  . Sexual activity: Yes  Lifestyle  . Physical activity:    Days per week: Not on file    Minutes per session: Not on file  . Stress: Not on file  Relationships  . Social connections:    Talks on phone: Not on file    Gets together: Not on file    Attends religious service: Not on file    Active member of club or organization: Not on file    Attends meetings of clubs or organizations: Not on file    Relationship status: Not on file  Other Topics Concern  . Not on file  Social  History Narrative  . Not on file     Review of Systems: A 12 point ROS discussed and pertinent positives are indicated in the HPI above.  All other systems are negative.  Review of Systems  Constitutional: Negative for fatigue and fever.  Respiratory: Negative for cough and shortness of breath.   Cardiovascular: Negative for chest pain.  Gastrointestinal: Negative for abdominal pain and nausea.  Musculoskeletal: Negative for back pain.  Psychiatric/Behavioral: Negative for behavioral problems and confusion.    Vital Signs: BP (!) 165/75 (BP Location: Right Arm)   Pulse (!) 58   Temp (!) 96.9 F (36.1 C) (Axillary)   Resp 16   SpO2 100%   Physical Exam  Constitutional: He is oriented to person, place, and time. He appears well-developed.  Cardiovascular: Normal rate, regular rhythm and normal heart sounds.  Pulmonary/Chest: Effort normal and breath sounds normal. No respiratory distress.  Abdominal: Soft. Bowel sounds are normal. He exhibits no distension. There is no tenderness.  Neurological: He is alert and oriented to person, place, and time.  Skin: Skin is warm and dry.  Psychiatric: He has a normal mood and affect. His behavior is normal. Judgment and thought content normal.  Nursing note and vitals reviewed.    MD Evaluation Airway: WNL Heart: WNL Abdomen: WNL Chest/ Lungs: WNL ASA  Classification: 3 Mallampati/Airway Score: One   Imaging: No results found.  Labs:  CBC: Recent Labs    04/03/17 1727 05/19/17 1010 09/22/17 1043 10/27/17 0811  WBC 2.9* 4.5 4.4 5.3  HGB 12.7* 12.9* 12.5* 13.3  HCT 34.6* 37.5 36.3* 38.8*  PLT 47* 90* 100.0* 107*    COAGS: Recent Labs    09/22/17 1043 10/27/17 0811  INR 1.1* 1.09  APTT  --  30    BMP: Recent Labs    04/03/17 1538 05/19/17 1010 09/22/17 1043 10/27/17 0811  NA 137 140 142 141  K 3.5 3.9 3.8 4.1  CL 105 103 106 107  CO2 19* _0 GLUCOSE 96 83 106* 88  BUN <5* 5* 5* 8  CALCIUM 8.1*  9.0 9.5 9.4  CREATININE 0.69 0.72* 0.78 0.65  GFRNONAA >60 97  --  >60  GFRAA >60 112  --  >60    LIVER FUNCTION TESTS: Recent Labs    05/19/17 1010 09/22/17 1043 10/27/17 0811  BILITOT 0.9 0.6 0.5  AST 52* 37 52*  ALT 35 21 28  ALKPHOS 112 114 108  PROT 7.3 7.3 7.7  ALBUMIN 3.1* 3.4* 3.4*  TUMOR MARKERS: Recent Labs    09/22/17 1043  AFPTM 5,417.1*    Assessment and Plan: Patient with past medical history of cirrhosis presents with complaint of hepatocellular carcinoma.  Patient and wife met with Dr. Annamaria Boots in consultation 08/12/17 to discuss management options.  They elect for Y90. Patient presents today in their usual state of health.  He has been NPO and is not currently on blood thinners.   Risks and benefits discussed with the patient including, but not limited to bleeding, infection, vascular injury, post procedural pain, nausea, vomiting and fatigue, contrast induced renal failure, liver failure, neutropenia and possible need for additional procedures.  All of the patient's questions were answered, patient is agreeable to proceed. Consent signed and in chart.   Thank you for this interesting consult.  I greatly enjoyed meeting Zachary Steele and look forward to participating in their care.  A copy of this report was sent to the requesting provider on this date.  Electronically Signed: Docia Barrier, PA 10/27/2017, 8:53 AM   I spent a total of    15 Minutes in face to face in clinical consultation, greater than 50% of which was counseling/coordinating care for hepatocellular carcinoma.

## 2017-10-27 NOTE — Procedures (Signed)
Multifocal HCC  S/p visceral angios, GDA and Cystic artery embos, and MAA injection for hepatic to pulmonary shunt calculation  No comp Stable EBL min Full report in pacs

## 2017-10-27 NOTE — Progress Notes (Signed)
PA paged to assess groin after patient complaint of discomfort.  PA to bedside. Groin is soft.  Dressing c/d/i.  Patient states "actually I think my bladder is full. It feels like just pressure." Patient given urinal and was able to void clear, yellow urine. No pain, pressure, or discomfort with assessment after urination.  Brynda Greathouse, MS RD PA-C 3:52 PM

## 2017-10-27 NOTE — Progress Notes (Signed)
Mr. Ruddy was treated at [facility] 10/27/17 in the short stay and radiology departments.  He may return to work Thursday 10/29/17,  however should use caution at his discretion with heavy lifting through the week.    Filomena Jungling RN / per V/O Brynda Greathouse, PA and Dr Annamaria Boots

## 2017-10-27 NOTE — Sedation Documentation (Signed)
Pressure at site removed at 11:59.

## 2017-10-28 ENCOUNTER — Other Ambulatory Visit: Payer: Self-pay

## 2017-10-28 LAB — AFP TUMOR MARKER: AFP, Serum, Tumor Marker: 15095 ng/mL — ABNORMAL HIGH (ref 0.0–8.3)

## 2017-10-28 LAB — CEA: CEA: 11.2 ng/mL — ABNORMAL HIGH (ref 0.0–4.7)

## 2017-11-02 ENCOUNTER — Inpatient Hospital Stay: Admission: RE | Admit: 2017-11-02 | Payer: Self-pay | Source: Ambulatory Visit

## 2017-11-05 ENCOUNTER — Other Ambulatory Visit: Payer: Self-pay | Admitting: Radiology

## 2017-11-06 ENCOUNTER — Encounter (HOSPITAL_COMMUNITY)
Admission: RE | Admit: 2017-11-06 | Discharge: 2017-11-06 | Disposition: A | Discharge status: Home / Self Care | Payer: Medicare HMO | Source: Ambulatory Visit | Attending: Interventional Radiology | Admitting: Interventional Radiology

## 2017-11-06 ENCOUNTER — Ambulatory Visit (HOSPITAL_COMMUNITY)
Admission: RE | Admit: 2017-11-06 | Discharge: 2017-11-06 | Disposition: A | Discharge status: Home / Self Care | Payer: Medicare HMO | Source: Ambulatory Visit | Attending: Interventional Radiology | Admitting: Interventional Radiology

## 2017-11-06 ENCOUNTER — Encounter (HOSPITAL_COMMUNITY): Payer: Self-pay

## 2017-11-06 ENCOUNTER — Other Ambulatory Visit (HOSPITAL_COMMUNITY): Payer: Self-pay | Admitting: Interventional Radiology

## 2017-11-06 ENCOUNTER — Other Ambulatory Visit: Payer: Self-pay

## 2017-11-06 DIAGNOSIS — I739 Peripheral vascular disease, unspecified: Secondary | ICD-10-CM | POA: Diagnosis not present

## 2017-11-06 DIAGNOSIS — I2510 Atherosclerotic heart disease of native coronary artery without angina pectoris: Secondary | ICD-10-CM | POA: Insufficient documentation

## 2017-11-06 DIAGNOSIS — M1990 Unspecified osteoarthritis, unspecified site: Secondary | ICD-10-CM | POA: Diagnosis not present

## 2017-11-06 DIAGNOSIS — K7460 Unspecified cirrhosis of liver: Secondary | ICD-10-CM | POA: Insufficient documentation

## 2017-11-06 DIAGNOSIS — F17210 Nicotine dependence, cigarettes, uncomplicated: Secondary | ICD-10-CM | POA: Insufficient documentation

## 2017-11-06 DIAGNOSIS — F329 Major depressive disorder, single episode, unspecified: Secondary | ICD-10-CM | POA: Insufficient documentation

## 2017-11-06 DIAGNOSIS — C220 Liver cell carcinoma: Secondary | ICD-10-CM | POA: Diagnosis not present

## 2017-11-06 DIAGNOSIS — B1920 Unspecified viral hepatitis C without hepatic coma: Secondary | ICD-10-CM | POA: Insufficient documentation

## 2017-11-06 DIAGNOSIS — Z951 Presence of aortocoronary bypass graft: Secondary | ICD-10-CM | POA: Insufficient documentation

## 2017-11-06 DIAGNOSIS — Z7982 Long term (current) use of aspirin: Secondary | ICD-10-CM | POA: Insufficient documentation

## 2017-11-06 DIAGNOSIS — B1910 Unspecified viral hepatitis B without hepatic coma: Secondary | ICD-10-CM | POA: Insufficient documentation

## 2017-11-06 DIAGNOSIS — B159 Hepatitis A without hepatic coma: Secondary | ICD-10-CM | POA: Diagnosis not present

## 2017-11-06 DIAGNOSIS — I2583 Coronary atherosclerosis due to lipid rich plaque: Secondary | ICD-10-CM | POA: Diagnosis not present

## 2017-11-06 HISTORY — PX: IR US GUIDE VASC ACCESS RIGHT: IMG2390

## 2017-11-06 HISTORY — PX: IR ANGIOGRAM SELECTIVE EACH ADDITIONAL VESSEL: IMG667

## 2017-11-06 HISTORY — PX: IR ANGIOGRAM VISCERAL SELECTIVE: IMG657

## 2017-11-06 HISTORY — PX: IR EMBO TUMOR ORGAN ISCHEMIA INFARCT INC GUIDE ROADMAPPING: IMG5449

## 2017-11-06 LAB — COMPREHENSIVE METABOLIC PANEL
ALT: 22 U/L (ref 0–44)
AST: 36 U/L (ref 15–41)
Albumin: 3.4 g/dL — ABNORMAL LOW (ref 3.5–5.0)
Alkaline Phosphatase: 85 U/L (ref 38–126)
Anion gap: 7 (ref 5–15)
BUN: 7 mg/dL — ABNORMAL LOW (ref 8–23)
CO2: 27 mmol/L (ref 22–32)
Calcium: 9.5 mg/dL (ref 8.9–10.3)
Chloride: 106 mmol/L (ref 98–111)
Creatinine, Ser: 0.69 mg/dL (ref 0.61–1.24)
GFR calc Af Amer: >60 mL/min (ref >60)
GFR calc non Af Amer: >60 mL/min (ref >60)
Glucose, Bld: 90 mg/dL (ref 70–99)
Potassium: 3.6 mmol/L (ref 3.5–5.1)
Sodium: 140 mmol/L (ref 135–145)
Total Bilirubin: 0.8 mg/dL (ref 0.3–1.2)
Total Protein: 7.7 g/dL (ref 6.5–8.1)

## 2017-11-06 LAB — CBC
HCT: 36.9 % — ABNORMAL LOW (ref 39.0–52.0)
Hemoglobin: 12.8 g/dL — ABNORMAL LOW (ref 13.0–17.0)
MCH: 32.9 pg (ref 26.0–34.0)
MCHC: 34.7 g/dL (ref 30.0–36.0)
MCV: 94.9 fL (ref 78.0–100.0)
Platelets: 137 10*3/uL — ABNORMAL LOW (ref 150–400)
RBC: 3.89 MIL/uL — ABNORMAL LOW (ref 4.22–5.81)
RDW: 13.4 % (ref 11.5–15.5)
WBC: 4.6 10*3/uL (ref 4.0–10.5)

## 2017-11-06 LAB — PROTIME-INR
INR: 1.09
Prothrombin Time: 14 seconds (ref 11.4–15.2)

## 2017-11-06 LAB — APTT: aPTT: 30 seconds (ref 24–36)

## 2017-11-06 MED ORDER — LIDOCAINE HCL 1 % IJ SOLN
INTRAMUSCULAR | Status: DC | PRN
  Administered 2017-11-06: 5 mL

## 2017-11-06 MED ORDER — ONDANSETRON HCL 4 MG/2ML IJ SOLN
4.0000 mg | Freq: Once | INTRAMUSCULAR | Status: AC
  Administered 2017-11-06: 4 mg via INTRAVENOUS
  Filled 2017-11-06: qty 2

## 2017-11-06 MED ORDER — PANTOPRAZOLE SODIUM 40 MG PO TBEC
40.0000 mg | DELAYED_RELEASE_TABLET | Freq: Once | ORAL | Status: DC
  Filled 2017-11-06: qty 1

## 2017-11-06 MED ORDER — LIDOCAINE HCL 1 % IJ SOLN
INTRAMUSCULAR | Status: AC
  Filled 2017-11-06: qty 20

## 2017-11-06 MED ORDER — PIPERACILLIN-TAZOBACTAM 3.375 G IVPB
3.3750 g | Freq: Once | INTRAVENOUS | Status: AC
  Administered 2017-11-06: 3.375 g via INTRAVENOUS
  Filled 2017-11-06: qty 50

## 2017-11-06 MED ORDER — NALOXONE HCL 0.4 MG/ML IJ SOLN
INTRAMUSCULAR | Status: AC
  Filled 2017-11-06: qty 1

## 2017-11-06 MED ORDER — IOPAMIDOL (ISOVUE-300) INJECTION 61%
INTRAVENOUS | Status: AC
  Administered 2017-11-06: 50 mL via INTRA_ARTERIAL
  Filled 2017-11-06: qty 200

## 2017-11-06 MED ORDER — YTTRIUM 90 INJECTION: 28.4000 | INJECTION | Freq: Once | INTRAVENOUS | Status: DC

## 2017-11-06 MED ORDER — FAMOTIDINE IN NACL 20-0.9 MG/50ML-% IV SOLN
20.0000 mg | Freq: Once | INTRAVENOUS | Status: AC
  Administered 2017-11-06: 20 mg via INTRAVENOUS
  Filled 2017-11-06: qty 50

## 2017-11-06 MED ORDER — IOPAMIDOL (ISOVUE-300) INJECTION 61%
100.0000 mL | Freq: Once | INTRAVENOUS | Status: AC | PRN
  Administered 2017-11-06: 50 mL via INTRA_ARTERIAL

## 2017-11-06 MED ORDER — FLUMAZENIL 0.5 MG/5ML IV SOLN
INTRAVENOUS | Status: AC
  Filled 2017-11-06: qty 5

## 2017-11-06 MED ORDER — FENTANYL CITRATE (PF) 100 MCG/2ML IJ SOLN
INTRAMUSCULAR | Status: AC
  Filled 2017-11-06: qty 6

## 2017-11-06 MED ORDER — MIDAZOLAM HCL 2 MG/2ML IJ SOLN
INTRAMUSCULAR | Status: AC | PRN
  Administered 2017-11-06: 1 mg via INTRAVENOUS
  Administered 2017-11-06 (×2): 0.5 mg via INTRAVENOUS
  Administered 2017-11-06: 1 mg via INTRAVENOUS

## 2017-11-06 MED ORDER — MIDAZOLAM HCL 2 MG/2ML IJ SOLN
INTRAMUSCULAR | Status: AC
  Filled 2017-11-06: qty 6

## 2017-11-06 MED ORDER — FENTANYL CITRATE (PF) 100 MCG/2ML IJ SOLN
INTRAMUSCULAR | Status: AC | PRN
  Administered 2017-11-06 (×2): 50 ug via INTRAVENOUS
  Administered 2017-11-06 (×2): 25 ug via INTRAVENOUS

## 2017-11-06 MED ORDER — SODIUM CHLORIDE 0.9 % IV SOLN
INTRAVENOUS | Status: DC
  Administered 2017-11-06: 08:00:00 via INTRAVENOUS

## 2017-11-06 NOTE — Procedures (Signed)
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

## 2017-11-06 NOTE — H&P (Signed)
Chief Complaint: Patient was seen in consultation today for hepatocellular carcinoma  Supervising Physician: Daryll Brod  Patient Status: [facility] - Out-pt  History of Present Illness: Zachary Steele is a 67 y.o. male with history of cirrhosis, CAD, depression, Hep C who was recently found to have hepatic lesions by [place].  Patient met with Dr. Annamaria Boots in Interventional Radiology clinic 08/12/17 to discuss potential treatment options.  He then underwent successful pre-Y90 roadmapping procedure 10/27/17.   Patient returns to radiology department today for Y-90 radioembolization procedure. He remains asymptomatic; denies fever, chills, cough, shortness of breath, abdominal pain, nausea, dysuria.   He has been NPO. He does not take blood thinners.   Past Medical History:  Diagnosis Date  . Alcohol dependence ([place])   . Anemia   . Arthritis   . Cataract   . Cirrhosis ([place])   . Coronary artery disease due to lipid rich plaque   . Depression   . Hepatitis C    Has A and B also  . PAD (peripheral artery disease) ([place])    pt reports he has this and has chronic leg "soreness"  . Peripheral vascular disease ([place])    essentially normal circulations with two-vessel  runoff to the feet bilaterally  . Tobacco abuse     Past Surgical History:  Procedure Laterality Date  . CARDIAC CATHETERIZATION    . CORONARY ARTERY BYPASS GRAFT  2010   Triple bypass at [facility]  . DOPPLER ECHOCARDIOGRAPHY  2011  . IR ANGIOGRAM SELECTIVE EACH ADDITIONAL VESSEL  10/27/2017  . IR ANGIOGRAM SELECTIVE EACH ADDITIONAL VESSEL  10/27/2017  . IR ANGIOGRAM SELECTIVE EACH ADDITIONAL VESSEL  10/27/2017  . IR ANGIOGRAM SELECTIVE EACH ADDITIONAL VESSEL  10/27/2017  . IR ANGIOGRAM SELECTIVE EACH ADDITIONAL VESSEL  10/27/2017  . IR ANGIOGRAM SELECTIVE EACH ADDITIONAL VESSEL  10/27/2017  . IR ANGIOGRAM SELECTIVE EACH ADDITIONAL VESSEL  10/27/2017  . IR ANGIOGRAM VISCERAL SELECTIVE  10/27/2017  . IR EMBO ARTERIAL NOT HEMORR HEMANG  INC GUIDE ROADMAPPING  10/27/2017  . IR RADIOLOGIST EVAL & MGMT  08/12/2017  . IR RADIOLOGIST EVAL & MGMT  09/22/2017  . IR US GUIDE VASC ACCESS RIGHT  10/27/2017  . NM MYOVIEW LTD  2012    Allergies: Statins  Medications: Prior to Admission medications   Medication Sig Start Date End Date Taking? Authorizing Provider  acetaminophen (TYLENOL) 500 MG tablet Take 1,000 mg by mouth every 6 (six) hours as needed.   Yes [provider]  aspirin 81 MG tablet Take 81 mg by mouth daily.   Yes [provider]  Cholecalciferol (VITAMIN D3 PO) Take 125 mcg by mouth daily.   Yes [provider]  Liver Extract (LIVER PO) Take by mouth.   Yes [provider]  Multiple Vitamin (MULTIVITAMIN WITH MINERALS) TABS tablet Take 1 tablet by mouth daily.   Yes [provider]  ibuprofen (ADVIL,MOTRIN) 400 MG tablet Take 400 mg by mouth every 6 (six) hours as needed.    [provider]     Family History  Problem Relation Age of Onset  . Stroke Mother   . Colon cancer Neg Hx     Social History   Socioeconomic History  . Marital status: Married    Spouse name: Not on file  . Number of children: 1  . Years of education: Not on file  . Highest education level: Not on file  Occupational History  . Occupation: Retail buyer    Comment: city of  [place]   Social Needs  . Financial resource strain: Not on file  . Food insecurity:    Worry: Not on file    Inability: Not on file  . Transportation needs:    Medical: Not on file    Non-medical: Not on file  Tobacco Use  . Smoking status: Current Some Day Smoker    Packs/day: 0.25    Years: 52.00    Pack years: 13.00    Types: Cigarettes  . Smokeless tobacco: Never Used  . Tobacco comment: Is trying to stop smoking, still has one or two per day most days  Substance and Sexual Activity  . Alcohol use: No    Frequency: Never    Comment: quit 04/2017; previous 40 year history 1 wine bottle per day    . Drug use: No    Comment: occ  . Sexual activity: Yes  Lifestyle  . Physical activity:    Days per week: Not on file    Minutes per session: Not on file  . Stress: Not on file  Relationships  . Social connections:    Talks on phone: Not on file    Gets together: Not on file    Attends religious service: Not on file    Active member of club or organization: Not on file    Attends meetings of clubs or organizations: Not on file    Relationship status: Not on file  Other Topics Concern  . Not on file  Social History Narrative  . Not on file     Review of Systems: A 12 point ROS discussed and pertinent positives are indicated in the HPI above.  All other systems are negative.  Review of Systems  Constitutional: Negative for fatigue and fever.  Respiratory: Negative for cough and shortness of breath.   Cardiovascular: Negative for chest pain.  Gastrointestinal: Negative for abdominal pain and nausea.  Musculoskeletal: Negative for back pain.  Psychiatric/Behavioral: Negative for behavioral problems and confusion.    Vital Signs: BP 123/76   Pulse (!) 57   Temp 97.6 F (36.4 C) (Oral)   Resp 16   Ht '5\' 7"'$  (1.702 m)   SpO2 100%   BMI 20.67 kg/m   Physical Exam  Constitutional: He is oriented to person, place, and time. He appears well-developed.  Cardiovascular: Normal rate, regular rhythm and normal heart sounds.  Pulmonary/Chest: Effort normal and breath sounds normal. No respiratory distress.  Abdominal: Soft. Bowel sounds are normal. He exhibits no distension. There is no tenderness.  Neurological: He is alert and oriented to person, place, and time.  Skin: Skin is warm and dry.  Psychiatric: He has a normal mood and affect. His behavior is normal. Judgment and thought content normal.  Nursing note and vitals reviewed.   MD Evaluation Airway: WNL Heart: WNL Abdomen: WNL Chest/ Lungs: WNL ASA  Classification: 3 Mallampati/Airway Score:  One   Imaging: Nm Liver Img Spect  Result Date: 10/27/2017 CLINICAL DATA:  Multifocal hepatocellular carcinoma. EXAM: NUCLEAR MEDICINE LIVER SCAN; ULTRASOUND MISCELLANEOUS SOFT TISSUE TECHNIQUE: Abdominal images were obtained in multiple projections after intrahepatic arterial injection of radiopharmaceutical. SPECT imaging was performed. Lung shunt calculation was performed. RADIOPHARMACEUTICALS:  5.0 millicuries technetium 99 MAA COMPARISON:  MRI 07/24/2017 FINDINGS: The injected microaggregated albumin localizes within the RIGHT hepatic lobe. No evidence of activity within the stomach, duodenum, or bowel. Calculated shunt fraction to the lungs equals 4.6%. IMPRESSION: 1. No significant extrahepatic radiotracer activity following intrahepatic arterial injection of MAA.  2. Lung shunt fraction equals 4.6% Electronically Signed   By: Suzy Bouchard M.D.   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 Elevated AFP. Non operative candidate. Pre Y 90 angiography, embolization and shunt calculation EXAM: ULTRASOUND GUIDANCE FOR VASCULAR ACCESS CELIAC, COMMON HEPATIC, RIGHT HEPATIC, CYSTIC, LEFT HEPATIC, GDA, AND RIGHT GASTRIC ARTERIAL CATHETERIZATIONS AND ANGIOGRAMS SUCCESSFUL CYSTIC AND GASTRODUODENAL ARTERY MICRO COIL EMBOLIZATIONS RIGHT HEPATIC TECHNETIUM MAA INJECTION FOR HEPATIC PULMONARY SHUNT CALCULATION MEDICATIONS: 3.375 G ZOSYN. The antibiotic was administered within 1 hour of the procedure ANESTHESIA/SEDATION: Moderate (conscious) sedation was employed during this procedure. A total of Versed 3.5 mg and Fentanyl 150 mcg was administered intravenously. Moderate Sedation Time: 1 HOUR 8 MINUTES. The patient's level of consciousness and vital signs were monitored continuously by radiology nursing throughout the procedure under my direct supervision. CONTRAST:  165 cc Isovue-300 FLUOROSCOPY  TIME:  Fluoroscopy Time: 33 minutes 24 seconds (1,711 mGy). COMPLICATIONS: None immediate. PROCEDURE: Informed consent was obtained from the patient following explanation of the procedure, risks, benefits and alternatives. The patient understands, agrees and consents for the procedure. All questions were addressed. A time out was performed prior to the initiation of the procedure. Maximal barrier sterile technique utilized including caps, mask, sterile gowns, sterile gloves, large sterile drape, hand hygiene, and Betadine prep. Under sterile conditions and local anesthesia, ultrasound micropuncture access performed the right common femoral artery. Images obtained for documentation of the patent right common femoral artery. Five French sheath inserted over a guidewire. C2 catheter advanced over guidewire and utilized to select the celiac origin. Celiac angiogram performed. Celiac: Celiac origin is widely patent including its branches. The splenic, left gastric, hepatic, and gastroduodenal arteries are all patent. Large central hepatic dome hypervascular mass noted correlating with the segment 4A lesion by MR. C2 catheter advanced over a Glidewire into the common hepatic artery. Common hepatic angiogram performed. Common hepatic: Common hepatic, proper hepatic, right and left hepatic arteries are all patent. Gastroduodenal artery is patent. Small visualized cystic artery off the right hepatic artery. Small visualized right gastric artery off the proper hepatic artery. Tumor hypervascularity noted throughout the liver correlating with the known multifocal hepato sellar carcinomas. Microcatheter advanced through the C2 catheter and utilized to select the right hepatic artery. Right hepatic angiogram performed. Right hepatic: Right hepatic artery supplies tumor vascularity to several lesions in the right hepatic lobe which correlate with the MRI scan. Small cystic artery noted. Micro Glidewire utilized to select the  cystic artery. Cystic angiogram performed. Cystic: Small cystic artery is patent supplying the gallbladder. Cystic artery micro coil embolization: To prevent non target radio embolization, 2 2 mm micro coils were utilized to occlude proximal aspect of the cystic artery successfully. Following embolization there is significant reduction in flow to the cystic artery territory. Microcatheter retracted and utilized to select the left hepatic artery. Left hepatic angiogram performed. Left hepatic: Left hepatic artery is patent and supplies a significant portion of the dominant 4A lesion. This segment 4A lesion has dual supply from both the right and left hepatic arteries. Microcatheter retracted and utilized to select the gastroduodenal artery. GDA angiogram performed. GDA: GDA is widely patent as well as the right gastroepiploic artery. Pancreaticoduodenal arcade is patent. There is a small parasitized vessel from the pancreaticoduodenal arcade supplying the inferior right liver margin incidentally noted. This may have developed from neovascularity to supply the hepatic tumors. GDA coil embolization: 4, 5 and  6 mm interlock micro coils were utilized to occlude/embolize the gastroduodenal artery to prevent non target radio embolization the day of treatment. Following embolization there is near complete occlusion of the GDA. Catheter retracted and utilized to select the origin of the right gastric artery. Selective right gastric angiogram performed. Right gastric: Right gastric artery is patent off of the proper hepatic artery. Several attempts were made with several micro guidewires to attempt further catheter access of the right gastric artery for embolization however after approximately 30 minutes of fluoroscopy this was unsuccessful. Right gastric artery could not be catheterized safely to perform micro coil embolization. Right hepatic technetium MAA injection: Microcatheter was retracted and utilized to  recatheterized the right hepatic artery peripherally at the trifurcation. 5 millicuries technetium MAA instilled slowly into the right hepatic artery for hepatic pulmonary shunt calculation later today. Catheters removed. Hemostasis obtained with a ExoSeal device. No immediate complication. Patient tolerated the procedure well. IMPRESSION: Successful extensive visceral angiography as above including micro coil embolization of the cystic and gastroduodenal arteries to prevent non target Y 90 radio embolization. Successful right hepatic technetium MAA injection for hepatic pulmonary shunt calculation. PLAN: Anticipate initial right hepatic Y 90 radio embolization schedule for 11/06/2017, followed by left hepatic Y 90 month later. Electronically Signed   By: Jerilynn Mages.  Shick M.D.   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 Elevated AFP. Non operative candidate. Pre Y 90 angiography, embolization and shunt calculation EXAM: ULTRASOUND GUIDANCE FOR VASCULAR ACCESS CELIAC, COMMON HEPATIC, RIGHT HEPATIC, CYSTIC, LEFT HEPATIC, GDA, AND RIGHT GASTRIC ARTERIAL CATHETERIZATIONS AND ANGIOGRAMS SUCCESSFUL CYSTIC AND GASTRODUODENAL ARTERY MICRO COIL EMBOLIZATIONS RIGHT HEPATIC TECHNETIUM MAA INJECTION FOR HEPATIC PULMONARY SHUNT CALCULATION MEDICATIONS: 3.375 G ZOSYN. The antibiotic was administered within 1 hour of the procedure ANESTHESIA/SEDATION: Moderate (conscious) sedation was employed during this procedure. A total of Versed 3.5 mg and Fentanyl 150 mcg was administered intravenously. Moderate Sedation Time: 1 HOUR 8 MINUTES. The patient's level of consciousness and vital signs were monitored continuously by radiology nursing throughout the procedure under my direct supervision. CONTRAST:  165 cc Isovue-300 FLUOROSCOPY TIME:  Fluoroscopy Time: 33 minutes 24 seconds (1,711 mGy). COMPLICATIONS:  None immediate. PROCEDURE: Informed consent was obtained from the patient following explanation of the procedure, risks, benefits and alternatives. The patient understands, agrees and consents for the procedure. All questions were addressed. A time out was performed prior to the initiation of the procedure. Maximal barrier sterile technique utilized including caps, mask, sterile gowns, sterile gloves, large sterile drape, hand hygiene, and Betadine prep. Under sterile conditions and local anesthesia, ultrasound micropuncture access performed the right common femoral artery. Images obtained for documentation of the patent right common femoral artery. Five French sheath inserted over a guidewire. C2 catheter advanced over guidewire and utilized to select the celiac origin. Celiac angiogram performed. Celiac: Celiac origin is widely patent including its branches. The splenic, left gastric, hepatic, and gastroduodenal arteries are all patent. Large central hepatic dome hypervascular mass noted correlating with the segment 4A lesion by MR. C2 catheter advanced over a Glidewire into the common hepatic artery. Common hepatic angiogram performed. Common hepatic: Common hepatic, proper hepatic, right and left hepatic arteries are all patent. Gastroduodenal artery is patent. Small visualized cystic artery off the right hepatic artery. Small visualized right gastric artery off the proper hepatic artery. Tumor hypervascularity noted throughout the liver correlating with the known multifocal hepato sellar carcinomas. Microcatheter  advanced through the C2 catheter and utilized to select the right hepatic artery. Right hepatic angiogram performed. Right hepatic: Right hepatic artery supplies tumor vascularity to several lesions in the right hepatic lobe which correlate with the MRI scan. Small cystic artery noted. Micro Glidewire utilized to select the cystic artery. Cystic angiogram performed. Cystic: Small cystic artery is  patent supplying the gallbladder. Cystic artery micro coil embolization: To prevent non target radio embolization, 2 2 mm micro coils were utilized to occlude proximal aspect of the cystic artery successfully. Following embolization there is significant reduction in flow to the cystic artery territory. Microcatheter retracted and utilized to select the left hepatic artery. Left hepatic angiogram performed. Left hepatic: Left hepatic artery is patent and supplies a significant portion of the dominant 4A lesion. This segment 4A lesion has dual supply from both the right and left hepatic arteries. Microcatheter retracted and utilized to select the gastroduodenal artery. GDA angiogram performed. GDA: GDA is widely patent as well as the right gastroepiploic artery. Pancreaticoduodenal arcade is patent. There is a small parasitized vessel from the pancreaticoduodenal arcade supplying the inferior right liver margin incidentally noted. This may have developed from neovascularity to supply the hepatic tumors. GDA coil embolization: 4, 5 and 6 mm interlock micro coils were utilized to occlude/embolize the gastroduodenal artery to prevent non target radio embolization the day of treatment. Following embolization there is near complete occlusion of the GDA. Catheter retracted and utilized to select the origin of the right gastric artery. Selective right gastric angiogram performed. Right gastric: Right gastric artery is patent off of the proper hepatic artery. Several attempts were made with several micro guidewires to attempt further catheter access of the right gastric artery for embolization however after approximately 30 minutes of fluoroscopy this was unsuccessful. Right gastric artery could not be catheterized safely to perform micro coil embolization. Right hepatic technetium MAA injection: Microcatheter was retracted and utilized to recatheterized the right hepatic artery peripherally at the trifurcation. 5 millicuries  technetium MAA instilled slowly into the right hepatic artery for hepatic pulmonary shunt calculation later today. Catheters removed. Hemostasis obtained with a ExoSeal device. No immediate complication. Patient tolerated the procedure well. IMPRESSION: Successful extensive visceral angiography as above including micro coil embolization of the cystic and gastroduodenal arteries to prevent non target Y 90 radio embolization. Successful right hepatic technetium MAA injection for hepatic pulmonary shunt calculation. PLAN: Anticipate initial right hepatic Y 90 radio embolization schedule for 11/06/2017, followed by left hepatic Y 90 month later. Electronically Signed   By: Jerilynn Mages.  Shick M.D.   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 Elevated AFP. Non operative candidate. Pre Y 90 angiography, embolization and shunt calculation EXAM: ULTRASOUND GUIDANCE FOR VASCULAR ACCESS CELIAC, COMMON HEPATIC, RIGHT HEPATIC, CYSTIC, LEFT HEPATIC, GDA, AND RIGHT GASTRIC ARTERIAL CATHETERIZATIONS AND ANGIOGRAMS SUCCESSFUL CYSTIC AND GASTRODUODENAL ARTERY MICRO COIL EMBOLIZATIONS RIGHT HEPATIC TECHNETIUM MAA INJECTION FOR HEPATIC PULMONARY SHUNT CALCULATION MEDICATIONS: 3.375 G ZOSYN. The antibiotic was administered within 1 hour of the procedure ANESTHESIA/SEDATION: Moderate (conscious) sedation was employed during this procedure. A total of Versed 3.5 mg and Fentanyl 150 mcg was administered intravenously. Moderate Sedation Time: 1 HOUR 8 MINUTES. The patient's level of consciousness and vital signs were monitored continuously by radiology nursing throughout the procedure under my direct supervision. CONTRAST:  165 cc Isovue-300 FLUOROSCOPY TIME:  Fluoroscopy Time: 33 minutes 24 seconds (1,711 mGy). COMPLICATIONS: None immediate.  PROCEDURE: Informed consent was obtained from the patient following  explanation of the procedure, risks, benefits and alternatives. The patient understands, agrees and consents for the procedure. All questions were addressed. A time out was performed prior to the initiation of the procedure. Maximal barrier sterile technique utilized including caps, mask, sterile gowns, sterile gloves, large sterile drape, hand hygiene, and Betadine prep. Under sterile conditions and local anesthesia, ultrasound micropuncture access performed the right common femoral artery. Images obtained for documentation of the patent right common femoral artery. Five French sheath inserted over a guidewire. C2 catheter advanced over guidewire and utilized to select the celiac origin. Celiac angiogram performed. Celiac: Celiac origin is widely patent including its branches. The splenic, left gastric, hepatic, and gastroduodenal arteries are all patent. Large central hepatic dome hypervascular mass noted correlating with the segment 4A lesion by MR. C2 catheter advanced over a Glidewire into the common hepatic artery. Common hepatic angiogram performed. Common hepatic: Common hepatic, proper hepatic, right and left hepatic arteries are all patent. Gastroduodenal artery is patent. Small visualized cystic artery off the right hepatic artery. Small visualized right gastric artery off the proper hepatic artery. Tumor hypervascularity noted throughout the liver correlating with the known multifocal hepato sellar carcinomas. Microcatheter advanced through the C2 catheter and utilized to select the right hepatic artery. Right hepatic angiogram performed. Right hepatic: Right hepatic artery supplies tumor vascularity to several lesions in the right hepatic lobe which correlate with the MRI scan. Small cystic artery noted. Micro Glidewire utilized to select the cystic artery. Cystic angiogram performed. Cystic: Small cystic artery is patent supplying the gallbladder. Cystic artery micro coil embolization: To prevent non  target radio embolization, 2 2 mm micro coils were utilized to occlude proximal aspect of the cystic artery successfully. Following embolization there is significant reduction in flow to the cystic artery territory. Microcatheter retracted and utilized to select the left hepatic artery. Left hepatic angiogram performed. Left hepatic: Left hepatic artery is patent and supplies a significant portion of the dominant 4A lesion. This segment 4A lesion has dual supply from both the right and left hepatic arteries. Microcatheter retracted and utilized to select the gastroduodenal artery. GDA angiogram performed. GDA: GDA is widely patent as well as the right gastroepiploic artery. Pancreaticoduodenal arcade is patent. There is a small parasitized vessel from the pancreaticoduodenal arcade supplying the inferior right liver margin incidentally noted. This may have developed from neovascularity to supply the hepatic tumors. GDA coil embolization: 4, 5 and 6 mm interlock micro coils were utilized to occlude/embolize the gastroduodenal artery to prevent non target radio embolization the day of treatment. Following embolization there is near complete occlusion of the GDA. Catheter retracted and utilized to select the origin of the right gastric artery. Selective right gastric angiogram performed. Right gastric: Right gastric artery is patent off of the proper hepatic artery. Several attempts were made with several micro guidewires to attempt further catheter access of the right gastric artery for embolization however after approximately 30 minutes of fluoroscopy this was unsuccessful. Right gastric artery could not be catheterized safely to perform micro coil embolization. Right hepatic technetium MAA injection: Microcatheter was retracted and utilized to recatheterized the right hepatic artery peripherally at the trifurcation. 5 millicuries technetium MAA instilled slowly into the right hepatic artery for hepatic pulmonary  shunt calculation later today. Catheters removed. Hemostasis obtained with a ExoSeal device. No immediate complication. Patient tolerated the procedure well. IMPRESSION: Successful extensive visceral angiography as above including micro coil embolization of the  cystic and gastroduodenal arteries to prevent non target Y 90 radio embolization. Successful right hepatic technetium MAA injection for hepatic pulmonary shunt calculation. PLAN: Anticipate initial right hepatic Y 90 radio embolization schedule for 11/06/2017, followed by left hepatic Y 90 month later. Electronically Signed   By: Jerilynn Mages.  Shick M.D.   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 Elevated AFP. Non operative candidate. Pre Y 90 angiography, embolization and shunt calculation EXAM: ULTRASOUND GUIDANCE FOR VASCULAR ACCESS CELIAC, COMMON HEPATIC, RIGHT HEPATIC, CYSTIC, LEFT HEPATIC, GDA, AND RIGHT GASTRIC ARTERIAL CATHETERIZATIONS AND ANGIOGRAMS SUCCESSFUL CYSTIC AND GASTRODUODENAL ARTERY MICRO COIL EMBOLIZATIONS RIGHT HEPATIC TECHNETIUM MAA INJECTION FOR HEPATIC PULMONARY SHUNT CALCULATION MEDICATIONS: 3.375 G ZOSYN. The antibiotic was administered within 1 hour of the procedure ANESTHESIA/SEDATION: Moderate (conscious) sedation was employed during this procedure. A total of Versed 3.5 mg and Fentanyl 150 mcg was administered intravenously. Moderate Sedation Time: 1 HOUR 8 MINUTES. The patient's level of consciousness and vital signs were monitored continuously by radiology nursing throughout the procedure under my direct supervision. CONTRAST:  165 cc Isovue-300 FLUOROSCOPY TIME:  Fluoroscopy Time: 33 minutes 24 seconds (1,711 mGy). COMPLICATIONS: None immediate. PROCEDURE: Informed consent was obtained from the patient following explanation of the procedure, risks, benefits and alternatives. The patient understands,  agrees and consents for the procedure. All questions were addressed. A time out was performed prior to the initiation of the procedure. Maximal barrier sterile technique utilized including caps, mask, sterile gowns, sterile gloves, large sterile drape, hand hygiene, and Betadine prep. Under sterile conditions and local anesthesia, ultrasound micropuncture access performed the right common femoral artery. Images obtained for documentation of the patent right common femoral artery. Five French sheath inserted over a guidewire. C2 catheter advanced over guidewire and utilized to select the celiac origin. Celiac angiogram performed. Celiac: Celiac origin is widely patent including its branches. The splenic, left gastric, hepatic, and gastroduodenal arteries are all patent. Large central hepatic dome hypervascular mass noted correlating with the segment 4A lesion by MR. C2 catheter advanced over a Glidewire into the common hepatic artery. Common hepatic angiogram performed. Common hepatic: Common hepatic, proper hepatic, right and left hepatic arteries are all patent. Gastroduodenal artery is patent. Small visualized cystic artery off the right hepatic artery. Small visualized right gastric artery off the proper hepatic artery. Tumor hypervascularity noted throughout the liver correlating with the known multifocal hepato sellar carcinomas. Microcatheter advanced through the C2 catheter and utilized to select the right hepatic artery. Right hepatic angiogram performed. Right hepatic: Right hepatic artery supplies tumor vascularity to several lesions in the right hepatic lobe which correlate with the MRI scan. Small cystic artery noted. Micro Glidewire utilized to select the cystic artery. Cystic angiogram performed. Cystic: Small cystic artery is patent supplying the gallbladder. Cystic artery micro coil embolization: To prevent non target radio embolization, 2 2 mm micro coils were utilized to occlude proximal aspect of  the cystic artery successfully. Following embolization there is significant reduction in flow to the cystic artery territory. Microcatheter retracted and utilized to select the left hepatic artery. Left hepatic angiogram performed. Left hepatic: Left hepatic artery is patent and supplies a significant portion of the dominant 4A lesion. This segment 4A lesion has dual supply from both the right and left hepatic arteries. Microcatheter retracted and utilized to select the gastroduodenal artery. GDA angiogram performed. GDA: GDA is widely patent as well as the right gastroepiploic artery.  Pancreaticoduodenal arcade is patent. There is a small parasitized vessel from the pancreaticoduodenal arcade supplying the inferior right liver margin incidentally noted. This may have developed from neovascularity to supply the hepatic tumors. GDA coil embolization: 4, 5 and 6 mm interlock micro coils were utilized to occlude/embolize the gastroduodenal artery to prevent non target radio embolization the day of treatment. Following embolization there is near complete occlusion of the GDA. Catheter retracted and utilized to select the origin of the right gastric artery. Selective right gastric angiogram performed. Right gastric: Right gastric artery is patent off of the proper hepatic artery. Several attempts were made with several micro guidewires to attempt further catheter access of the right gastric artery for embolization however after approximately 30 minutes of fluoroscopy this was unsuccessful. Right gastric artery could not be catheterized safely to perform micro coil embolization. Right hepatic technetium MAA injection: Microcatheter was retracted and utilized to recatheterized the right hepatic artery peripherally at the trifurcation. 5 millicuries technetium MAA instilled slowly into the right hepatic artery for hepatic pulmonary shunt calculation later today. Catheters removed. Hemostasis obtained with a ExoSeal device.  No immediate complication. Patient tolerated the procedure well. IMPRESSION: Successful extensive visceral angiography as above including micro coil embolization of the cystic and gastroduodenal arteries to prevent non target Y 90 radio embolization. Successful right hepatic technetium MAA injection for hepatic pulmonary shunt calculation. PLAN: Anticipate initial right hepatic Y 90 radio embolization schedule for 11/06/2017, followed by left hepatic Y 90 month later. Electronically Signed   By: Jerilynn Mages.  Shick M.D.   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 Elevated AFP. Non operative candidate. Pre Y 90 angiography, embolization and shunt calculation EXAM: ULTRASOUND GUIDANCE FOR VASCULAR ACCESS CELIAC, COMMON HEPATIC, RIGHT HEPATIC, CYSTIC, LEFT HEPATIC, GDA, AND RIGHT GASTRIC ARTERIAL CATHETERIZATIONS AND ANGIOGRAMS SUCCESSFUL CYSTIC AND GASTRODUODENAL ARTERY MICRO COIL EMBOLIZATIONS RIGHT HEPATIC TECHNETIUM MAA INJECTION FOR HEPATIC PULMONARY SHUNT CALCULATION MEDICATIONS: 3.375 G ZOSYN. The antibiotic was administered within 1 hour of the procedure ANESTHESIA/SEDATION: Moderate (conscious) sedation was employed during this procedure. A total of Versed 3.5 mg and Fentanyl 150 mcg was administered intravenously. Moderate Sedation Time: 1 HOUR 8 MINUTES. The patient's level of consciousness and vital signs were monitored continuously by radiology nursing throughout the procedure under my direct supervision. CONTRAST:  165 cc Isovue-300 FLUOROSCOPY TIME:  Fluoroscopy Time: 33 minutes 24 seconds (1,711 mGy). COMPLICATIONS: None immediate. PROCEDURE: Informed consent was obtained from the patient following explanation of the procedure, risks, benefits and alternatives. The patient understands, agrees and consents for the procedure. All questions were addressed. A time out was performed  prior to the initiation of the procedure. Maximal barrier sterile technique utilized including caps, mask, sterile gowns, sterile gloves, large sterile drape, hand hygiene, and Betadine prep. Under sterile conditions and local anesthesia, ultrasound micropuncture access performed the right common femoral artery. Images obtained for documentation of the patent right common femoral artery. Five French sheath inserted over a guidewire. C2 catheter advanced over guidewire and utilized to select the celiac origin. Celiac angiogram performed. Celiac: Celiac origin is widely patent including its branches. The splenic, left gastric, hepatic, and gastroduodenal arteries are all patent. Large central hepatic dome hypervascular mass noted correlating with the segment 4A lesion by MR. C2 catheter advanced over a Glidewire into the common hepatic artery. Common hepatic angiogram performed. Common hepatic: Common hepatic, proper hepatic, right and left hepatic arteries are all  patent. Gastroduodenal artery is patent. Small visualized cystic artery off the right hepatic artery. Small visualized right gastric artery off the proper hepatic artery. Tumor hypervascularity noted throughout the liver correlating with the known multifocal hepato sellar carcinomas. Microcatheter advanced through the C2 catheter and utilized to select the right hepatic artery. Right hepatic angiogram performed. Right hepatic: Right hepatic artery supplies tumor vascularity to several lesions in the right hepatic lobe which correlate with the MRI scan. Small cystic artery noted. Micro Glidewire utilized to select the cystic artery. Cystic angiogram performed. Cystic: Small cystic artery is patent supplying the gallbladder. Cystic artery micro coil embolization: To prevent non target radio embolization, 2 2 mm micro coils were utilized to occlude proximal aspect of the cystic artery successfully. Following embolization there is significant reduction in flow  to the cystic artery territory. Microcatheter retracted and utilized to select the left hepatic artery. Left hepatic angiogram performed. Left hepatic: Left hepatic artery is patent and supplies a significant portion of the dominant 4A lesion. This segment 4A lesion has dual supply from both the right and left hepatic arteries. Microcatheter retracted and utilized to select the gastroduodenal artery. GDA angiogram performed. GDA: GDA is widely patent as well as the right gastroepiploic artery. Pancreaticoduodenal arcade is patent. There is a small parasitized vessel from the pancreaticoduodenal arcade supplying the inferior right liver margin incidentally noted. This may have developed from neovascularity to supply the hepatic tumors. GDA coil embolization: 4, 5 and 6 mm interlock micro coils were utilized to occlude/embolize the gastroduodenal artery to prevent non target radio embolization the day of treatment. Following embolization there is near complete occlusion of the GDA. Catheter retracted and utilized to select the origin of the right gastric artery. Selective right gastric angiogram performed. Right gastric: Right gastric artery is patent off of the proper hepatic artery. Several attempts were made with several micro guidewires to attempt further catheter access of the right gastric artery for embolization however after approximately 30 minutes of fluoroscopy this was unsuccessful. Right gastric artery could not be catheterized safely to perform micro coil embolization. Right hepatic technetium MAA injection: Microcatheter was retracted and utilized to recatheterized the right hepatic artery peripherally at the trifurcation. 5 millicuries technetium MAA instilled slowly into the right hepatic artery for hepatic pulmonary shunt calculation later today. Catheters removed. Hemostasis obtained with a ExoSeal device. No immediate complication. Patient tolerated the procedure well. IMPRESSION: Successful  extensive visceral angiography as above including micro coil embolization of the cystic and gastroduodenal arteries to prevent non target Y 90 radio embolization. Successful right hepatic technetium MAA injection for hepatic pulmonary shunt calculation. PLAN: Anticipate initial right hepatic Y 90 radio embolization schedule for 11/06/2017, followed by left hepatic Y 90 month later. Electronically Signed   By: Jerilynn Mages.  Shick M.D.   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 Elevated AFP. Non operative candidate. Pre Y 90 angiography, embolization and shunt calculation EXAM: ULTRASOUND GUIDANCE FOR VASCULAR ACCESS CELIAC, COMMON HEPATIC, RIGHT HEPATIC, CYSTIC, LEFT HEPATIC, GDA, AND RIGHT GASTRIC ARTERIAL CATHETERIZATIONS AND ANGIOGRAMS SUCCESSFUL CYSTIC AND GASTRODUODENAL ARTERY MICRO COIL EMBOLIZATIONS RIGHT HEPATIC TECHNETIUM MAA INJECTION FOR HEPATIC PULMONARY SHUNT CALCULATION MEDICATIONS: 3.375 G ZOSYN. The antibiotic was administered within 1 hour of the procedure ANESTHESIA/SEDATION: Moderate (conscious) sedation was employed during this procedure. A total of Versed 3.5 mg and Fentanyl 150 mcg was administered intravenously. Moderate Sedation Time: 1 HOUR 8 MINUTES. The  patient's level of consciousness and vital signs were monitored continuously by radiology nursing throughout the procedure under my direct supervision. CONTRAST:  165 cc Isovue-300 FLUOROSCOPY TIME:  Fluoroscopy Time: 33 minutes 24 seconds (1,711 mGy). COMPLICATIONS: None immediate. PROCEDURE: Informed consent was obtained from the patient following explanation of the procedure, risks, benefits and alternatives. The patient understands, agrees and consents for the procedure. All questions were addressed. A time out was performed prior to the initiation of the procedure. Maximal barrier sterile technique utilized  including caps, mask, sterile gowns, sterile gloves, large sterile drape, hand hygiene, and Betadine prep. Under sterile conditions and local anesthesia, ultrasound micropuncture access performed the right common femoral artery. Images obtained for documentation of the patent right common femoral artery. Five French sheath inserted over a guidewire. C2 catheter advanced over guidewire and utilized to select the celiac origin. Celiac angiogram performed. Celiac: Celiac origin is widely patent including its branches. The splenic, left gastric, hepatic, and gastroduodenal arteries are all patent. Large central hepatic dome hypervascular mass noted correlating with the segment 4A lesion by MR. C2 catheter advanced over a Glidewire into the common hepatic artery. Common hepatic angiogram performed. Common hepatic: Common hepatic, proper hepatic, right and left hepatic arteries are all patent. Gastroduodenal artery is patent. Small visualized cystic artery off the right hepatic artery. Small visualized right gastric artery off the proper hepatic artery. Tumor hypervascularity noted throughout the liver correlating with the known multifocal hepato sellar carcinomas. Microcatheter advanced through the C2 catheter and utilized to select the right hepatic artery. Right hepatic angiogram performed. Right hepatic: Right hepatic artery supplies tumor vascularity to several lesions in the right hepatic lobe which correlate with the MRI scan. Small cystic artery noted. Micro Glidewire utilized to select the cystic artery. Cystic angiogram performed. Cystic: Small cystic artery is patent supplying the gallbladder. Cystic artery micro coil embolization: To prevent non target radio embolization, 2 2 mm micro coils were utilized to occlude proximal aspect of the cystic artery successfully. Following embolization there is significant reduction in flow to the cystic artery territory. Microcatheter retracted and utilized to select the  left hepatic artery. Left hepatic angiogram performed. Left hepatic: Left hepatic artery is patent and supplies a significant portion of the dominant 4A lesion. This segment 4A lesion has dual supply from both the right and left hepatic arteries. Microcatheter retracted and utilized to select the gastroduodenal artery. GDA angiogram performed. GDA: GDA is widely patent as well as the right gastroepiploic artery. Pancreaticoduodenal arcade is patent. There is a small parasitized vessel from the pancreaticoduodenal arcade supplying the inferior right liver margin incidentally noted. This may have developed from neovascularity to supply the hepatic tumors. GDA coil embolization: 4, 5 and 6 mm interlock micro coils were utilized to occlude/embolize the gastroduodenal artery to prevent non target radio embolization the day of treatment. Following embolization there is near complete occlusion of the GDA. Catheter retracted and utilized to select the origin of the right gastric artery. Selective right gastric angiogram performed. Right gastric: Right gastric artery is patent off of the proper hepatic artery. Several attempts were made with several micro guidewires to attempt further catheter access of the right gastric artery for embolization however after approximately 30 minutes of fluoroscopy this was unsuccessful. Right gastric artery could not be catheterized safely to perform micro coil embolization. Right hepatic technetium MAA injection: Microcatheter was retracted and utilized to recatheterized the right hepatic artery peripherally at the trifurcation. 5 millicuries technetium MAA instilled slowly into the right  hepatic artery for hepatic pulmonary shunt calculation later today. Catheters removed. Hemostasis obtained with a ExoSeal device. No immediate complication. Patient tolerated the procedure well. IMPRESSION: Successful extensive visceral angiography as above including micro coil embolization of the cystic  and gastroduodenal arteries to prevent non target Y 90 radio embolization. Successful right hepatic technetium MAA injection for hepatic pulmonary shunt calculation. PLAN: Anticipate initial right hepatic Y 90 radio embolization schedule for 11/06/2017, followed by left hepatic Y 90 month later. Electronically Signed   By: Jerilynn Mages.  Shick M.D.   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 Elevated AFP. Non operative candidate. Pre Y 90 angiography, embolization and shunt calculation EXAM: ULTRASOUND GUIDANCE FOR VASCULAR ACCESS CELIAC, COMMON HEPATIC, RIGHT HEPATIC, CYSTIC, LEFT HEPATIC, GDA, AND RIGHT GASTRIC ARTERIAL CATHETERIZATIONS AND ANGIOGRAMS SUCCESSFUL CYSTIC AND GASTRODUODENAL ARTERY MICRO COIL EMBOLIZATIONS RIGHT HEPATIC TECHNETIUM MAA INJECTION FOR HEPATIC PULMONARY SHUNT CALCULATION MEDICATIONS: 3.375 G ZOSYN. The antibiotic was administered within 1 hour of the procedure ANESTHESIA/SEDATION: Moderate (conscious) sedation was employed during this procedure. A total of Versed 3.5 mg and Fentanyl 150 mcg was administered intravenously. Moderate Sedation Time: 1 HOUR 8 MINUTES. The patient's level of consciousness and vital signs were monitored continuously by radiology nursing throughout the procedure under my direct supervision. CONTRAST:  165 cc Isovue-300 FLUOROSCOPY TIME:  Fluoroscopy Time: 33 minutes 24 seconds (1,711 mGy). COMPLICATIONS: None immediate. PROCEDURE: Informed consent was obtained from the patient following explanation of the procedure, risks, benefits and alternatives. The patient understands, agrees and consents for the procedure. All questions were addressed. A time out was performed prior to the initiation of the procedure. Maximal barrier sterile technique utilized including caps, mask, sterile gowns, sterile gloves, large sterile drape, hand hygiene, and  Betadine prep. Under sterile conditions and local anesthesia, ultrasound micropuncture access performed the right common femoral artery. Images obtained for documentation of the patent right common femoral artery. Five French sheath inserted over a guidewire. C2 catheter advanced over guidewire and utilized to select the celiac origin. Celiac angiogram performed. Celiac: Celiac origin is widely patent including its branches. The splenic, left gastric, hepatic, and gastroduodenal arteries are all patent. Large central hepatic dome hypervascular mass noted correlating with the segment 4A lesion by MR. C2 catheter advanced over a Glidewire into the common hepatic artery. Common hepatic angiogram performed. Common hepatic: Common hepatic, proper hepatic, right and left hepatic arteries are all patent. Gastroduodenal artery is patent. Small visualized cystic artery off the right hepatic artery. Small visualized right gastric artery off the proper hepatic artery. Tumor hypervascularity noted throughout the liver correlating with the known multifocal hepato sellar carcinomas. Microcatheter advanced through the C2 catheter and utilized to select the right hepatic artery. Right hepatic angiogram performed. Right hepatic: Right hepatic artery supplies tumor vascularity to several lesions in the right hepatic lobe which correlate with the MRI scan. Small cystic artery noted. Micro Glidewire utilized to select the cystic artery. Cystic angiogram performed. Cystic: Small cystic artery is patent supplying the gallbladder. Cystic artery micro coil embolization: To prevent non target radio embolization, 2 2 mm micro coils were utilized to occlude proximal aspect of the cystic artery successfully. Following embolization there is significant reduction in flow to the cystic artery territory. Microcatheter retracted and utilized to select the left hepatic artery. Left hepatic angiogram performed. Left hepatic: Left hepatic artery is  patent and supplies a significant portion of the dominant 4A lesion.  This segment 4A lesion has dual supply from both the right and left hepatic arteries. Microcatheter retracted and utilized to select the gastroduodenal artery. GDA angiogram performed. GDA: GDA is widely patent as well as the right gastroepiploic artery. Pancreaticoduodenal arcade is patent. There is a small parasitized vessel from the pancreaticoduodenal arcade supplying the inferior right liver margin incidentally noted. This may have developed from neovascularity to supply the hepatic tumors. GDA coil embolization: 4, 5 and 6 mm interlock micro coils were utilized to occlude/embolize the gastroduodenal artery to prevent non target radio embolization the day of treatment. Following embolization there is near complete occlusion of the GDA. Catheter retracted and utilized to select the origin of the right gastric artery. Selective right gastric angiogram performed. Right gastric: Right gastric artery is patent off of the proper hepatic artery. Several attempts were made with several micro guidewires to attempt further catheter access of the right gastric artery for embolization however after approximately 30 minutes of fluoroscopy this was unsuccessful. Right gastric artery could not be catheterized safely to perform micro coil embolization. Right hepatic technetium MAA injection: Microcatheter was retracted and utilized to recatheterized the right hepatic artery peripherally at the trifurcation. 5 millicuries technetium MAA instilled slowly into the right hepatic artery for hepatic pulmonary shunt calculation later today. Catheters removed. Hemostasis obtained with a ExoSeal device. No immediate complication. Patient tolerated the procedure well. IMPRESSION: Successful extensive visceral angiography as above including micro coil embolization of the cystic and gastroduodenal arteries to prevent non target Y 90 radio embolization. Successful right  hepatic technetium MAA injection for hepatic pulmonary shunt calculation. PLAN: Anticipate initial right hepatic Y 90 radio embolization schedule for 11/06/2017, followed by left hepatic Y 90 month later. Electronically Signed   By: Jerilynn Mages.  Shick M.D.   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 Elevated AFP. Non operative candidate. Pre Y 90 angiography, embolization and shunt calculation EXAM: ULTRASOUND GUIDANCE FOR VASCULAR ACCESS CELIAC, COMMON HEPATIC, RIGHT HEPATIC, CYSTIC, LEFT HEPATIC, GDA, AND RIGHT GASTRIC ARTERIAL CATHETERIZATIONS AND ANGIOGRAMS SUCCESSFUL CYSTIC AND GASTRODUODENAL ARTERY MICRO COIL EMBOLIZATIONS RIGHT HEPATIC TECHNETIUM MAA INJECTION FOR HEPATIC PULMONARY SHUNT CALCULATION MEDICATIONS: 3.375 G ZOSYN. The antibiotic was administered within 1 hour of the procedure ANESTHESIA/SEDATION: Moderate (conscious) sedation was employed during this procedure. A total of Versed 3.5 mg and Fentanyl 150 mcg was administered intravenously. Moderate Sedation Time: 1 HOUR 8 MINUTES. The patient's level of consciousness and vital signs were monitored continuously by radiology nursing throughout the procedure under my direct supervision. CONTRAST:  165 cc Isovue-300 FLUOROSCOPY TIME:  Fluoroscopy Time: 33 minutes 24 seconds (1,711 mGy). COMPLICATIONS: None immediate. PROCEDURE: Informed consent was obtained from the patient following explanation of the procedure, risks, benefits and alternatives. The patient understands, agrees and consents for the procedure. All questions were addressed. A time out was performed prior to the initiation of the procedure. Maximal barrier sterile technique utilized including caps, mask, sterile gowns, sterile gloves, large sterile drape, hand hygiene, and Betadine prep. Under sterile conditions and local anesthesia, ultrasound micropuncture  access performed the right common femoral artery. Images obtained for documentation of the patent right common femoral artery. Five French sheath inserted over a guidewire. C2 catheter advanced over guidewire and utilized to select the celiac origin. Celiac angiogram performed. Celiac: Celiac origin is widely patent including its branches. The splenic, left gastric, hepatic, and gastroduodenal arteries are all patent. Large central hepatic dome  hypervascular mass noted correlating with the segment 4A lesion by MR. C2 catheter advanced over a Glidewire into the common hepatic artery. Common hepatic angiogram performed. Common hepatic: Common hepatic, proper hepatic, right and left hepatic arteries are all patent. Gastroduodenal artery is patent. Small visualized cystic artery off the right hepatic artery. Small visualized right gastric artery off the proper hepatic artery. Tumor hypervascularity noted throughout the liver correlating with the known multifocal hepato sellar carcinomas. Microcatheter advanced through the C2 catheter and utilized to select the right hepatic artery. Right hepatic angiogram performed. Right hepatic: Right hepatic artery supplies tumor vascularity to several lesions in the right hepatic lobe which correlate with the MRI scan. Small cystic artery noted. Micro Glidewire utilized to select the cystic artery. Cystic angiogram performed. Cystic: Small cystic artery is patent supplying the gallbladder. Cystic artery micro coil embolization: To prevent non target radio embolization, 2 2 mm micro coils were utilized to occlude proximal aspect of the cystic artery successfully. Following embolization there is significant reduction in flow to the cystic artery territory. Microcatheter retracted and utilized to select the left hepatic artery. Left hepatic angiogram performed. Left hepatic: Left hepatic artery is patent and supplies a significant portion of the dominant 4A lesion. This segment 4A  lesion has dual supply from both the right and left hepatic arteries. Microcatheter retracted and utilized to select the gastroduodenal artery. GDA angiogram performed. GDA: GDA is widely patent as well as the right gastroepiploic artery. Pancreaticoduodenal arcade is patent. There is a small parasitized vessel from the pancreaticoduodenal arcade supplying the inferior right liver margin incidentally noted. This may have developed from neovascularity to supply the hepatic tumors. GDA coil embolization: 4, 5 and 6 mm interlock micro coils were utilized to occlude/embolize the gastroduodenal artery to prevent non target radio embolization the day of treatment. Following embolization there is near complete occlusion of the GDA. Catheter retracted and utilized to select the origin of the right gastric artery. Selective right gastric angiogram performed. Right gastric: Right gastric artery is patent off of the proper hepatic artery. Several attempts were made with several micro guidewires to attempt further catheter access of the right gastric artery for embolization however after approximately 30 minutes of fluoroscopy this was unsuccessful. Right gastric artery could not be catheterized safely to perform micro coil embolization. Right hepatic technetium MAA injection: Microcatheter was retracted and utilized to recatheterized the right hepatic artery peripherally at the trifurcation. 5 millicuries technetium MAA instilled slowly into the right hepatic artery for hepatic pulmonary shunt calculation later today. Catheters removed. Hemostasis obtained with a ExoSeal device. No immediate complication. Patient tolerated the procedure well. IMPRESSION: Successful extensive visceral angiography as above including micro coil embolization of the cystic and gastroduodenal arteries to prevent non target Y 90 radio embolization. Successful right hepatic technetium MAA injection for hepatic pulmonary shunt calculation. PLAN:  Anticipate initial right hepatic Y 90 radio embolization schedule for 11/06/2017, followed by left hepatic Y 90 month later. Electronically Signed   By: Jerilynn Mages.  Shick M.D.   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 Elevated AFP. Non operative candidate. Pre Y 90 angiography, embolization and shunt calculation EXAM: ULTRASOUND GUIDANCE FOR VASCULAR ACCESS CELIAC, COMMON HEPATIC, RIGHT HEPATIC, CYSTIC, LEFT HEPATIC, GDA, AND RIGHT GASTRIC ARTERIAL CATHETERIZATIONS AND ANGIOGRAMS SUCCESSFUL CYSTIC AND GASTRODUODENAL ARTERY MICRO COIL EMBOLIZATIONS RIGHT HEPATIC TECHNETIUM MAA INJECTION FOR HEPATIC PULMONARY SHUNT CALCULATION MEDICATIONS: 3.375 G ZOSYN. The  antibiotic was administered within 1 hour of the procedure ANESTHESIA/SEDATION: Moderate (conscious) sedation was employed during this procedure. A total of Versed 3.5 mg and Fentanyl 150 mcg was administered intravenously. Moderate Sedation Time: 1 HOUR 8 MINUTES. The patient's level of consciousness and vital signs were monitored continuously by radiology nursing throughout the procedure under my direct supervision. CONTRAST:  165 cc Isovue-300 FLUOROSCOPY TIME:  Fluoroscopy Time: 33 minutes 24 seconds (1,711 mGy). COMPLICATIONS: None immediate. PROCEDURE: Informed consent was obtained from the patient following explanation of the procedure, risks, benefits and alternatives. The patient understands, agrees and consents for the procedure. All questions were addressed. A time out was performed prior to the initiation of the procedure. Maximal barrier sterile technique utilized including caps, mask, sterile gowns, sterile gloves, large sterile drape, hand hygiene, and Betadine prep. Under sterile conditions and local anesthesia, ultrasound micropuncture access performed the right common femoral artery. Images obtained for documentation of the patent  right common femoral artery. Five French sheath inserted over a guidewire. C2 catheter advanced over guidewire and utilized to select the celiac origin. Celiac angiogram performed. Celiac: Celiac origin is widely patent including its branches. The splenic, left gastric, hepatic, and gastroduodenal arteries are all patent. Large central hepatic dome hypervascular mass noted correlating with the segment 4A lesion by MR. C2 catheter advanced over a Glidewire into the common hepatic artery. Common hepatic angiogram performed. Common hepatic: Common hepatic, proper hepatic, right and left hepatic arteries are all patent. Gastroduodenal artery is patent. Small visualized cystic artery off the right hepatic artery. Small visualized right gastric artery off the proper hepatic artery. Tumor hypervascularity noted throughout the liver correlating with the known multifocal hepato sellar carcinomas. Microcatheter advanced through the C2 catheter and utilized to select the right hepatic artery. Right hepatic angiogram performed. Right hepatic: Right hepatic artery supplies tumor vascularity to several lesions in the right hepatic lobe which correlate with the MRI scan. Small cystic artery noted. Micro Glidewire utilized to select the cystic artery. Cystic angiogram performed. Cystic: Small cystic artery is patent supplying the gallbladder. Cystic artery micro coil embolization: To prevent non target radio embolization, 2 2 mm micro coils were utilized to occlude proximal aspect of the cystic artery successfully. Following embolization there is significant reduction in flow to the cystic artery territory. Microcatheter retracted and utilized to select the left hepatic artery. Left hepatic angiogram performed. Left hepatic: Left hepatic artery is patent and supplies a significant portion of the dominant 4A lesion. This segment 4A lesion has dual supply from both the right and left hepatic arteries. Microcatheter retracted and  utilized to select the gastroduodenal artery. GDA angiogram performed. GDA: GDA is widely patent as well as the right gastroepiploic artery. Pancreaticoduodenal arcade is patent. There is a small parasitized vessel from the pancreaticoduodenal arcade supplying the inferior right liver margin incidentally noted. This may have developed from neovascularity to supply the hepatic tumors. GDA coil embolization: 4, 5 and 6 mm interlock micro coils were utilized to occlude/embolize the gastroduodenal artery to prevent non target radio embolization the day of treatment. Following embolization there is near complete occlusion of the GDA. Catheter retracted and utilized to select the origin of the right gastric artery. Selective right gastric angiogram performed. Right gastric: Right gastric artery is patent off of the proper hepatic artery. Several attempts were made with several micro guidewires to attempt further catheter access of the right gastric artery for embolization however after approximately 30 minutes of fluoroscopy this was unsuccessful. Right gastric artery  could not be catheterized safely to perform micro coil embolization. Right hepatic technetium MAA injection: Microcatheter was retracted and utilized to recatheterized the right hepatic artery peripherally at the trifurcation. 5 millicuries technetium MAA instilled slowly into the right hepatic artery for hepatic pulmonary shunt calculation later today. Catheters removed. Hemostasis obtained with a ExoSeal device. No immediate complication. Patient tolerated the procedure well. IMPRESSION: Successful extensive visceral angiography as above including micro coil embolization of the cystic and gastroduodenal arteries to prevent non target Y 90 radio embolization. Successful right hepatic technetium MAA injection for hepatic pulmonary shunt calculation. PLAN: Anticipate initial right hepatic Y 90 radio embolization schedule for 11/06/2017, followed by left  hepatic Y 90 month later. Electronically Signed   By: Jerilynn Mages.  Shick M.D.   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 Elevated AFP. Non operative candidate. Pre Y 90 angiography, embolization and shunt calculation EXAM: ULTRASOUND GUIDANCE FOR VASCULAR ACCESS CELIAC, COMMON HEPATIC, RIGHT HEPATIC, CYSTIC, LEFT HEPATIC, GDA, AND RIGHT GASTRIC ARTERIAL CATHETERIZATIONS AND ANGIOGRAMS SUCCESSFUL CYSTIC AND GASTRODUODENAL ARTERY MICRO COIL EMBOLIZATIONS RIGHT HEPATIC TECHNETIUM MAA INJECTION FOR HEPATIC PULMONARY SHUNT CALCULATION MEDICATIONS: 3.375 G ZOSYN. The antibiotic was administered within 1 hour of the procedure ANESTHESIA/SEDATION: Moderate (conscious) sedation was employed during this procedure. A total of Versed 3.5 mg and Fentanyl 150 mcg was administered intravenously. Moderate Sedation Time: 1 HOUR 8 MINUTES. The patient's level of consciousness and vital signs were monitored continuously by radiology nursing throughout the procedure under my direct supervision. CONTRAST:  165 cc Isovue-300 FLUOROSCOPY TIME:  Fluoroscopy Time: 33 minutes 24 seconds (1,711 mGy). COMPLICATIONS: None immediate. PROCEDURE: Informed consent was obtained from the patient following explanation of the procedure, risks, benefits and alternatives. The patient understands, agrees and consents for the procedure. All questions were addressed. A time out was performed prior to the initiation of the procedure. Maximal barrier sterile technique utilized including caps, mask, sterile gowns, sterile gloves, large sterile drape, hand hygiene, and Betadine prep. Under sterile conditions and local anesthesia, ultrasound micropuncture access performed the right common femoral artery. Images obtained for documentation of the patent right common femoral artery. Five French sheath inserted over a guidewire.  C2 catheter advanced over guidewire and utilized to select the celiac origin. Celiac angiogram performed. Celiac: Celiac origin is widely patent including its branches. The splenic, left gastric, hepatic, and gastroduodenal arteries are all patent. Large central hepatic dome hypervascular mass noted correlating with the segment 4A lesion by MR. C2 catheter advanced over a Glidewire into the common hepatic artery. Common hepatic angiogram performed. Common hepatic: Common hepatic, proper hepatic, right and left hepatic arteries are all patent. Gastroduodenal artery is patent. Small visualized cystic artery off the right hepatic artery. Small visualized right gastric artery off the proper hepatic artery. Tumor hypervascularity noted throughout the liver correlating with the known multifocal hepato sellar carcinomas. Microcatheter advanced through the C2 catheter and utilized to select the right hepatic artery. Right hepatic angiogram performed. Right hepatic: Right hepatic artery supplies tumor vascularity to several lesions in the right hepatic lobe which correlate with the MRI scan. Small cystic artery noted. Micro Glidewire utilized to select the cystic artery. Cystic angiogram performed. Cystic: Small cystic artery is patent supplying the gallbladder. Cystic artery micro coil embolization: To prevent non target radio embolization, 2 2 mm micro coils were utilized to occlude proximal aspect of the cystic artery successfully. Following embolization there  is significant reduction in flow to the cystic artery territory. Microcatheter retracted and utilized to select the left hepatic artery. Left hepatic angiogram performed. Left hepatic: Left hepatic artery is patent and supplies a significant portion of the dominant 4A lesion. This segment 4A lesion has dual supply from both the right and left hepatic arteries. Microcatheter retracted and utilized to select the gastroduodenal artery. GDA angiogram performed. GDA: GDA  is widely patent as well as the right gastroepiploic artery. Pancreaticoduodenal arcade is patent. There is a small parasitized vessel from the pancreaticoduodenal arcade supplying the inferior right liver margin incidentally noted. This may have developed from neovascularity to supply the hepatic tumors. GDA coil embolization: 4, 5 and 6 mm interlock micro coils were utilized to occlude/embolize the gastroduodenal artery to prevent non target radio embolization the day of treatment. Following embolization there is near complete occlusion of the GDA. Catheter retracted and utilized to select the origin of the right gastric artery. Selective right gastric angiogram performed. Right gastric: Right gastric artery is patent off of the proper hepatic artery. Several attempts were made with several micro guidewires to attempt further catheter access of the right gastric artery for embolization however after approximately 30 minutes of fluoroscopy this was unsuccessful. Right gastric artery could not be catheterized safely to perform micro coil embolization. Right hepatic technetium MAA injection: Microcatheter was retracted and utilized to recatheterized the right hepatic artery peripherally at the trifurcation. 5 millicuries technetium MAA instilled slowly into the right hepatic artery for hepatic pulmonary shunt calculation later today. Catheters removed. Hemostasis obtained with a ExoSeal device. No immediate complication. Patient tolerated the procedure well. IMPRESSION: Successful extensive visceral angiography as above including micro coil embolization of the cystic and gastroduodenal arteries to prevent non target Y 90 radio embolization. Successful right hepatic technetium MAA injection for hepatic pulmonary shunt calculation. PLAN: Anticipate initial right hepatic Y 90 radio embolization schedule for 11/06/2017, followed by left hepatic Y 90 month later. Electronically Signed   By: Jerilynn Mages.  Shick M.D.   On: 10/27/2017  13:09   Nm Fusion  Result Date: 10/27/2017 CLINICAL DATA:  Multifocal hepatocellular carcinoma. EXAM: NUCLEAR MEDICINE LIVER SCAN; ULTRASOUND MISCELLANEOUS SOFT TISSUE TECHNIQUE: Abdominal images were obtained in multiple projections after intrahepatic arterial injection of radiopharmaceutical. SPECT imaging was performed. Lung shunt calculation was performed. RADIOPHARMACEUTICALS:  5.0 millicuries technetium 99 MAA COMPARISON:  MRI 07/24/2017 FINDINGS: The injected microaggregated albumin localizes within the RIGHT hepatic lobe. No evidence of activity within the stomach, duodenum, or bowel. Calculated shunt fraction to the lungs equals 4.6%. IMPRESSION: 1. No significant extrahepatic radiotracer activity following intrahepatic arterial injection of MAA. 2. Lung shunt fraction equals 4.6% Electronically Signed   By: Suzy Bouchard M.D.   On: 10/27/2017 14:10    Labs:  CBC: Recent Labs    05/19/17 1010 09/22/17 1043 10/27/17 0811 11/06/17 0746  WBC 4.5 4.4 5.3 4.6  HGB 12.9* 12.5* 13.3 12.8*  HCT 37.5 36.3* 38.8* 36.9*  PLT 90* 100.0* 107* 137*    COAGS: Recent Labs    09/22/17 1043 10/27/17 0811  INR 1.1* 1.09  APTT  --  30    BMP: Recent Labs    04/03/17 1538 05/19/17 1010 09/22/17 1043 10/27/17 0811 11/06/17 0746  NA 137 140 142 141 140  K 3.5 3.9 3.8 4.1 3.6  CL 105 103 106 107 106  CO2 19* '22 29 24 27  '$ GLUCOSE 96 83 106* 88 90  BUN <5* 5* 5* 8 7*  CALCIUM 8.1*  9.0 9.5 9.4 9.5  CREATININE 0.69 0.72* 0.78 0.65 0.69  GFRNONAA >60 97  --  >60 >60  GFRAA >60 112  --  >60 >60    LIVER FUNCTION TESTS: Recent Labs    05/19/17 1010 09/22/17 1043 10/27/17 0811 11/06/17 0746  BILITOT 0.9 0.6 0.5 0.8  AST 52* 37 52* 36  ALT 35 '21 28 22  '$ ALKPHOS 112 114 108 85  PROT 7.3 7.3 7.7 7.7  ALBUMIN 3.1* 3.4* 3.4* 3.4*    TUMOR MARKERS: Recent Labs    09/22/17 1043  AFPTM 5,417.1*    Assessment and Plan: Patient with past medical history of cirrhosis  presents with complaint of hepatocellular carcinoma.  He underwent pre Y-90 roadmapping 10/27/17 which was successful.  He denies recent changes in his medical status or symptoms. He presents today for radioembolization procedure.  He is accompanied by his wife.   Risks and benefits discussed with the patient including, but not limited to bleeding, infection, vascular injury, post procedural pain, nausea, vomiting and fatigue, contrast induced renal failure, liver failure, radiation injury to the bowel, radiation induced cholecystitis, neutropenia and possible need for additional procedures.  All of the patient's questions were answered, patient is agreeable to proceed. Consent signed and in chart.   Thank you for this interesting consult.  I greatly enjoyed meeting Zachary Steele and look forward to participating in their care.  A copy of this report was sent to the requesting provider on this date.  Electronically Signed: Docia Barrier, PA 11/06/2017, 8:45 AM   I spent a total of    15 Minutes in face to face in clinical consultation, greater than 50% of which was counseling/coordinating care for hepatocellular carcinoma.

## 2017-11-06 NOTE — Discharge Instructions (Signed)
Moderate Conscious Sedation, Adult, Care After These instructions provide you with information about caring for yourself after your procedure. Your health care provider may also give you more specific instructions. Your treatment has been planned according to current medical practices, but problems sometimes occur. Call your health care provider if you have any problems or questions after your procedure. What can I expect after the procedure? After your procedure, it is common:  To feel sleepy for several hours.  To feel clumsy and have poor balance for several hours.  To have poor judgment for several hours.  To vomit if you eat too soon.  Follow these instructions at home: For at least 24 hours after the procedure:   Do not: ? Participate in activities where you could fall or become injured. ? Drive. ? Use heavy machinery. ? Drink alcohol. ? Take sleeping pills or medicines that cause drowsiness. ? Make important decisions or sign legal documents. ? Take care of children on your own.  Rest. Eating and drinking  Follow the diet recommended by your health care provider.  If you vomit: ? Drink water, juice, or soup when you can drink without vomiting. ? Make sure you have little or no nausea before eating solid foods. General instructions  Have a responsible adult stay with you until you are awake and alert.  Take over-the-counter and prescription medicines only as told by your health care provider.  If you smoke, do not smoke without supervision.  Keep all follow-up visits as told by your health care provider. This is important. Contact a health care provider if:  You keep feeling nauseous or you keep vomiting.  You feel light-headed.  You develop a rash.  You have a fever. Get help right away if:  You have trouble breathing. This information is not intended to replace advice given to you by your health care provider. Make sure you discuss any questions you have  with your health care provider. Document Released: 01/19/2013 Document Revised: 09/03/2015 Document Reviewed: 07/21/2015 Elsevier Interactive Patient Education  2018 [place] Radioembol[facility] Discharge Instructions  You have been given a radioactive material during your procedure.  While it is safe for you to be discharged home from the hospital, you need to proceed directly home.    Do not use public transportation, including air travel, lasting more than 2 hours for 1 week.  Avoid crowded public places for 1 week.  Adult visitors should try to avoid close contact with you for 1 week.    Children and pregnant females should not visit or have close contact with you for 1 week.  Items that you touch are not radioactive.  Do not sleep in the same bed as your partner for 1 week, and a condom should be used for sexual activity during the first 24 hours.  Your blood may be radioactive and caution should be used if any bleeding occurs during the recovery period.  Body fluids may be radioactive for 24 hours.  Wash your hands after voiding.  Men should sit to urinate.  Dispose of any soiled materials (flush down toilet or place in trash at home) during the first day.  Drink 6 to 8 glasses of fluids per day for 5 days to hydrate yourself.  If you need to see a doctor during the first week, you must let them know that you were treated with yttrium-90 microspheres, and will be slightly radioactive.  They can call Interventional Radiology [phone] with any  questions.

## 2017-11-26 ENCOUNTER — Encounter: Payer: Self-pay | Admitting: Gastroenterology

## 2017-12-03 ENCOUNTER — Other Ambulatory Visit: Payer: Self-pay | Admitting: Radiology

## 2017-12-04 ENCOUNTER — Other Ambulatory Visit: Payer: Self-pay

## 2017-12-04 ENCOUNTER — Ambulatory Visit (HOSPITAL_COMMUNITY)
Admission: RE | Admit: 2017-12-04 | Discharge: 2017-12-04 | Disposition: A | Discharge status: Home / Self Care | Payer: Medicare HMO | Source: Ambulatory Visit | Attending: Interventional Radiology | Admitting: Interventional Radiology

## 2017-12-04 ENCOUNTER — Other Ambulatory Visit (HOSPITAL_COMMUNITY): Payer: Self-pay | Admitting: Interventional Radiology

## 2017-12-04 ENCOUNTER — Encounter (HOSPITAL_COMMUNITY): Payer: Self-pay

## 2017-12-04 DIAGNOSIS — F329 Major depressive disorder, single episode, unspecified: Secondary | ICD-10-CM | POA: Insufficient documentation

## 2017-12-04 DIAGNOSIS — Z951 Presence of aortocoronary bypass graft: Secondary | ICD-10-CM | POA: Diagnosis not present

## 2017-12-04 DIAGNOSIS — I2583 Coronary atherosclerosis due to lipid rich plaque: Secondary | ICD-10-CM | POA: Insufficient documentation

## 2017-12-04 DIAGNOSIS — C220 Liver cell carcinoma: Secondary | ICD-10-CM

## 2017-12-04 DIAGNOSIS — B1920 Unspecified viral hepatitis C without hepatic coma: Secondary | ICD-10-CM | POA: Insufficient documentation

## 2017-12-04 DIAGNOSIS — I739 Peripheral vascular disease, unspecified: Secondary | ICD-10-CM | POA: Insufficient documentation

## 2017-12-04 DIAGNOSIS — M1990 Unspecified osteoarthritis, unspecified site: Secondary | ICD-10-CM | POA: Insufficient documentation

## 2017-12-04 DIAGNOSIS — K7030 Alcoholic cirrhosis of liver without ascites: Secondary | ICD-10-CM | POA: Insufficient documentation

## 2017-12-04 DIAGNOSIS — I2510 Atherosclerotic heart disease of native coronary artery without angina pectoris: Secondary | ICD-10-CM | POA: Diagnosis not present

## 2017-12-04 DIAGNOSIS — F1020 Alcohol dependence, uncomplicated: Secondary | ICD-10-CM | POA: Insufficient documentation

## 2017-12-04 DIAGNOSIS — Z7982 Long term (current) use of aspirin: Secondary | ICD-10-CM | POA: Diagnosis not present

## 2017-12-04 HISTORY — PX: IR ANGIOGRAM VISCERAL SELECTIVE: IMG657

## 2017-12-04 HISTORY — PX: IR US GUIDE VASC ACCESS RIGHT: IMG2390

## 2017-12-04 HISTORY — PX: IR ANGIOGRAM SELECTIVE EACH ADDITIONAL VESSEL: IMG667

## 2017-12-04 HISTORY — PX: IR EMBO TUMOR ORGAN ISCHEMIA INFARCT INC GUIDE ROADMAPPING: IMG5449

## 2017-12-04 LAB — COMPREHENSIVE METABOLIC PANEL
ALT: 15 U/L (ref 0–44)
AST: 29 U/L (ref 15–41)
Albumin: 3.3 g/dL — ABNORMAL LOW (ref 3.5–5.0)
Alkaline Phosphatase: 85 U/L (ref 38–126)
Anion gap: 9 (ref 5–15)
BUN: 7 mg/dL — ABNORMAL LOW (ref 8–23)
CO2: 26 mmol/L (ref 22–32)
Calcium: 9.2 mg/dL (ref 8.9–10.3)
Chloride: 106 mmol/L (ref 98–111)
Creatinine, Ser: 0.7 mg/dL (ref 0.61–1.24)
GFR calc Af Amer: >60 mL/min (ref >60)
GFR calc non Af Amer: >60 mL/min (ref >60)
Glucose, Bld: 82 mg/dL (ref 70–99)
Potassium: 3.7 mmol/L (ref 3.5–5.1)
Sodium: 141 mmol/L (ref 135–145)
Total Bilirubin: 1 mg/dL (ref 0.3–1.2)
Total Protein: 7.5 g/dL (ref 6.5–8.1)

## 2017-12-04 LAB — CBC WITH DIFFERENTIAL/PLATELET
Basophils Absolute: 0 10*3/uL (ref 0.0–0.1)
Basophils Relative: 1 %
Eosinophils Absolute: 0.1 10*3/uL (ref 0.0–0.7)
Eosinophils Relative: 3 %
HCT: 35.8 % — ABNORMAL LOW (ref 39.0–52.0)
Hemoglobin: 12.3 g/dL — ABNORMAL LOW (ref 13.0–17.0)
Lymphocytes Relative: 15 %
Lymphs Abs: 0.7 10*3/uL (ref 0.7–4.0)
MCH: 32.8 pg (ref 26.0–34.0)
MCHC: 34.4 g/dL (ref 30.0–36.0)
MCV: 95.5 fL (ref 78.0–100.0)
Monocytes Absolute: 0.5 10*3/uL (ref 0.1–1.0)
Monocytes Relative: 11 %
Neutro Abs: 3.2 10*3/uL (ref 1.7–7.7)
Neutrophils Relative %: 70 %
Platelets: 130 10*3/uL — ABNORMAL LOW (ref 150–400)
RBC: 3.75 MIL/uL — ABNORMAL LOW (ref 4.22–5.81)
RDW: 14.3 % (ref 11.5–15.5)
WBC: 4.5 10*3/uL (ref 4.0–10.5)

## 2017-12-04 LAB — PROTIME-INR
INR: 1
Prothrombin Time: 13.1 seconds (ref 11.4–15.2)

## 2017-12-04 MED ORDER — FENTANYL CITRATE (PF) 100 MCG/2ML IJ SOLN
INTRAMUSCULAR | Status: AC | PRN
  Administered 2017-12-04 (×2): 50 ug via INTRAVENOUS

## 2017-12-04 MED ORDER — NALOXONE HCL 0.4 MG/ML IJ SOLN
INTRAMUSCULAR | Status: AC
  Filled 2017-12-04: qty 1

## 2017-12-04 MED ORDER — MIDAZOLAM HCL 2 MG/2ML IJ SOLN
INTRAMUSCULAR | Status: AC | PRN
  Administered 2017-12-04: 0.5 mg via INTRAVENOUS
  Administered 2017-12-04: 1 mg via INTRAVENOUS
  Administered 2017-12-04: 2 mg via INTRAVENOUS
  Administered 2017-12-04: 0.5 mg via INTRAVENOUS

## 2017-12-04 MED ORDER — IOPAMIDOL (ISOVUE-300) INJECTION 61%
100.0000 mL | Freq: Once | INTRAVENOUS | Status: AC | PRN
  Administered 2017-12-04: 6 mL via INTRA_ARTERIAL

## 2017-12-04 MED ORDER — PIPERACILLIN-TAZOBACTAM 3.375 G IVPB 30 MIN
3.3750 g | Freq: Once | INTRAVENOUS | Status: AC
  Administered 2017-12-04: 3.375 g via INTRAVENOUS
  Filled 2017-12-04: qty 50

## 2017-12-04 MED ORDER — FENTANYL CITRATE (PF) 100 MCG/2ML IJ SOLN
INTRAMUSCULAR | Status: AC
  Filled 2017-12-04: qty 4

## 2017-12-04 MED ORDER — MIDAZOLAM HCL 2 MG/2ML IJ SOLN
INTRAMUSCULAR | Status: AC
  Filled 2017-12-04: qty 4

## 2017-12-04 MED ORDER — LIDOCAINE HCL 1 % IJ SOLN
INTRAMUSCULAR | Status: AC | PRN
  Administered 2017-12-04: 10 mL

## 2017-12-04 MED ORDER — SODIUM CHLORIDE 0.9 % IV SOLN
INTRAVENOUS | Status: DC
  Administered 2017-12-04: 08:00:00 via INTRAVENOUS

## 2017-12-04 MED ORDER — FAMOTIDINE IN NACL 20-0.9 MG/50ML-% IV SOLN
20.0000 mg | Freq: Once | INTRAVENOUS | Status: AC
  Administered 2017-12-04: 20 mg via INTRAVENOUS
  Filled 2017-12-04: qty 50

## 2017-12-04 MED ORDER — FLUMAZENIL 0.5 MG/5ML IV SOLN
INTRAVENOUS | Status: AC
  Filled 2017-12-04: qty 5

## 2017-12-04 MED ORDER — PIPERACILLIN-TAZOBACTAM 3.375 G IVPB: 3.3750 g | Freq: Once | INTRAVENOUS | Status: DC

## 2017-12-04 MED ORDER — YTTRIUM 90 INJECTION
14.9600 | INJECTION | Freq: Once | INTRAVENOUS | Status: AC
  Administered 2017-12-04: 14.96 via INTRAVENOUS

## 2017-12-04 MED ORDER — SODIUM CHLORIDE 0.9 % IV SOLN
8.0000 mg | Freq: Once | INTRAVENOUS | Status: AC
  Administered 2017-12-04: 8 mg via INTRAVENOUS
  Filled 2017-12-04: qty 4

## 2017-12-04 MED ORDER — IOPAMIDOL (ISOVUE-300) INJECTION 61%
100.0000 mL | Freq: Once | INTRAVENOUS | Status: AC | PRN
  Administered 2017-12-04: 50 mL via INTRA_ARTERIAL

## 2017-12-04 MED ORDER — DEXAMETHASONE SODIUM PHOSPHATE 10 MG/ML IJ SOLN
8.0000 mg | Freq: Once | INTRAMUSCULAR | Status: AC
  Administered 2017-12-04: 8 mg via INTRAVENOUS
  Filled 2017-12-04: qty 1

## 2017-12-04 MED ORDER — LIDOCAINE HCL 1 % IJ SOLN
INTRAMUSCULAR | Status: AC
  Filled 2017-12-04: qty 20

## 2017-12-04 NOTE — H&P (Signed)
Referring Physician(s): Drazek,D  Supervising Physician: Daryll Brod  Patient Status:  [facility]  Chief Complaint:  Multifocal hepatocellular carcinoma  Subjective: Patient familiar to IR service from prior right hepatic lobe Y 90 radioembolization on 11/06/2017.  He has a history of multifocal HCC, hepatitis C and cirrhosis.  He presents again today for left hepatic lobe Y 90 radioembolization.  He currently denies fever, headache, chest pain, dyspnea, cough, abdominal/back pain, nausea, vomiting or bleeding.  He does have some arthritic pain in hands and feet.  Past Medical History:  Diagnosis Date  . Alcohol dependence ([place])   . Anemia   . Arthritis   . Cataract   . Cirrhosis ([place])   . Coronary artery disease due to lipid rich plaque   . Depression   . Hepatitis C    Has A and B also  . PAD (peripheral artery disease) ([place])    pt reports he has this and has chronic leg "soreness"  . Peripheral vascular disease ([place])    essentially normal circulations with two-vessel  runoff to the feet bilaterally  . Tobacco abuse    Past Surgical History:  Procedure Laterality Date  . CARDIAC CATHETERIZATION    . CORONARY ARTERY BYPASS GRAFT  2010   Triple bypass at [facility]  . DOPPLER ECHOCARDIOGRAPHY  2011  . IR ANGIOGRAM SELECTIVE EACH ADDITIONAL VESSEL  10/27/2017  . IR ANGIOGRAM SELECTIVE EACH ADDITIONAL VESSEL  10/27/2017  . IR ANGIOGRAM SELECTIVE EACH ADDITIONAL VESSEL  10/27/2017  . IR ANGIOGRAM SELECTIVE EACH ADDITIONAL VESSEL  10/27/2017  . IR ANGIOGRAM SELECTIVE EACH ADDITIONAL VESSEL  10/27/2017  . IR ANGIOGRAM SELECTIVE EACH ADDITIONAL VESSEL  10/27/2017  . IR ANGIOGRAM SELECTIVE EACH ADDITIONAL VESSEL  10/27/2017  . IR ANGIOGRAM SELECTIVE EACH ADDITIONAL VESSEL  11/06/2017  . IR ANGIOGRAM SELECTIVE EACH ADDITIONAL VESSEL  11/06/2017  . IR ANGIOGRAM SELECTIVE EACH ADDITIONAL VESSEL  11/06/2017  . IR ANGIOGRAM VISCERAL SELECTIVE  10/27/2017  . IR ANGIOGRAM VISCERAL SELECTIVE   11/06/2017  . IR EMBO ARTERIAL NOT HEMORR HEMANG INC GUIDE ROADMAPPING  10/27/2017  . IR EMBO TUMOR ORGAN ISCHEMIA INFARCT INC GUIDE ROADMAPPING  11/06/2017  . IR RADIOLOGIST EVAL & MGMT  08/12/2017  . IR RADIOLOGIST EVAL & MGMT  09/22/2017  . IR US GUIDE VASC ACCESS RIGHT  10/27/2017  . IR US GUIDE VASC ACCESS RIGHT  11/06/2017  . NM MYOVIEW LTD  2012      Allergies: Statins  Medications: Prior to Admission medications   Medication Sig Start Date End Date Taking? Authorizing Provider  acetaminophen (TYLENOL) 500 MG tablet Take 1,000 mg by mouth every 6 (six) hours as needed.   Yes [provider]  aspirin 81 MG tablet Take 81 mg by mouth daily.   Yes [provider]  Cholecalciferol (VITAMIN D3 PO) Take 125 mcg by mouth daily.   Yes [provider]  Liver Extract (LIVER PO) Take by mouth.   Yes [provider]  Multiple Vitamin (MULTIVITAMIN WITH MINERALS) TABS tablet Take 1 tablet by mouth daily.   Yes [provider]  ibuprofen (ADVIL,MOTRIN) 400 MG tablet Take 400 mg by mouth every 6 (six) hours as needed.    [provider]     Vital Signs: BP 114/72   Pulse 62   Temp 98.7 F (37.1 C) (Oral)   Resp 16   SpO2 100%   Physical Exam awake, alert.  Chest clear to auscultation bilaterally.  Heart with regular rate and  rhythm.  Abdomen soft, positive bowel sounds, nontender.  No lower extremity edema.  Imaging: No results found.  Labs:  CBC: Recent Labs    09/22/17 1043 10/27/17 0811 11/06/17 0746 12/04/17 0800  WBC 4.4 5.3 4.6 4.5  HGB 12.5* 13.3 12.8* 12.3*  HCT 36.3* 38.8* 36.9* 35.8*  PLT 100.0* 107* 137* 130*    COAGS: Recent Labs    09/22/17 1043 10/27/17 0811 11/06/17 0746 12/04/17 0800  INR 1.1* 1.09 1.09 1.00  APTT  --  30 30  --     BMP: Recent Labs    04/03/17 1538 05/19/17 1010 09/22/17 1043 10/27/17 0811 11/06/17 0746  NA 137 140 142 141 140  K 3.5 3.9 3.8 4.1 3.6  CL 105 103 106 107  106  CO2 19* 22 29 24 27   GLUCOSE 96 83 106* 88 90  BUN <5* 5* 5* 8 7*  CALCIUM 8.1* 9.0 9.5 9.4 9.5  CREATININE 0.69 0.72* 0.78 0.65 0.69  GFRNONAA >60 97  --  >60 >60  GFRAA >60 112  --  >60 >60    LIVER FUNCTION TESTS: Recent Labs    05/19/17 1010 09/22/17 1043 10/27/17 0811 11/06/17 0746  BILITOT 0.9 0.6 0.5 0.8  AST 52* 37 52* 36  ALT 35 21 28 22   ALKPHOS 112 114 108 85  PROT 7.3 7.3 7.7 7.7  ALBUMIN 3.1* 3.4* 3.4* 3.4*    Assessment and Plan: Pt with history of multifocal HCC, hepatitis C and cirrhosis.  Status post right hepatic lobe Y 90 radioembolization on 11/06/2017.  He presents again today for left hepatic lobe Y 90 radioembolization.Risks and benefits of  were discussed with the patient including, but not limited to bleeding, infection, vascular injury or contrast induced renal failure.  This interventional procedure involves the use of X-rays and because of the nature of the planned procedure, it is possible that we will have prolonged use of X-ray fluoroscopy.  Potential radiation risks to you include (but are not limited to) the following: - A slightly elevated risk for cancer  several years later in life. This risk is typically less than 0.5% percent. This risk is low in comparison to the normal incidence of human cancer, which is 33% for women and 50% for men according to the [place]. - Radiation induced injury can include skin redness, resembling a rash, tissue breakdown / ulcers and hair loss (which can be temporary or permanent).   The likelihood of either of these occurring depends on the difficulty of the procedure and whether you are sensitive to radiation due to previous procedures, disease, or genetic conditions.   IF your procedure requires a prolonged use of radiation, you will be notified and given written instructions for further action.  It is your responsibility to monitor the irradiated area for the 2 weeks following the  procedure and to notify your physician if you are concerned that you have suffered a radiation induced injury.    All of the patient's questions were answered, patient is agreeable to proceed.  Consent signed and in chart.      Electronically Signed: D. Rowe Robert, PA-C 12/04/2017, 8:56 AM   I spent a total of 25 minutes with patient on hospital floor or unit, greater than 50% of which was counseling/coordinating care for left hepatic lobe Y 90 radioembolization

## 2017-12-04 NOTE — Sedation Documentation (Signed)
Patient is resting comfortably with eyes closed in NAD. Occasionally snoring.

## 2017-12-04 NOTE — Procedures (Signed)
Multifocal Hcc  S/p LEFT HEPATIC y90  NO comp Stable EBL min Full report in pacs

## 2017-12-04 NOTE — Discharge Instructions (Addendum)
° °Hepatic Artery Radioembolization, Care After °This sheet gives you information about how to care for yourself after your procedure. Your health care provider may also give you more specific instructions. If you have problems or questions, contact your health care provider. °What can I expect after the procedure? °After the procedure, it is common to have: °· A slight fever for 1-2 weeks. If your fever gets worse, tell your health care provider. °· Fatigue. °· Loss of appetite. This should gradually improve after about 1 week. °· Abdominal pain on your right side. °· Soreness and tenderness in your groin area where the needle and catheter were placed (puncture site). ° °Follow these instructions at home: °Puncture site care °· Follow instructions from your health care provider about how to take care of the puncture site. Make sure you: °? Wash your hands with soap and water before you change your bandage (dressing). If soap and water are not available, use hand sanitizer. °? Change your dressing as told by your health care provider. °? Leave stitches (sutures), skin glue, or adhesive strips in place. These skin closures may need to stay in place for 2 weeks or longer. If adhesive strip edges start to loosen and curl up, you may trim the loose edges. Do not remove adhesive strips completely unless your health care provider tells you to do that. °· Check your puncture site every day for signs of infection. Check for: °? More redness, swelling, or pain. °? More fluid or blood. °? Warmth. °? Pus or a bad smell. °Activity °· Rest and return to your normal activities as told by your health care provider. Ask your health care provider what activities are safe for you. °· Do not drive for 24 hours after the procedure if you were given a medicine to help you relax (sedative). °· Do not lift anything that is heavier than 10 lb (4.5 kg) until your health care provider says that it is safe. °Medicines °· Take  over-the-counter and prescription medicines only as told by your health care provider. °· Do not drive or use heavy machinery while taking prescription pain medicine. °Radiation precautions °· For up to a week after your procedure, there will be a small amount of radioactivity near your liver. This is not especially dangerous to other people. However, you should follow these precautions for 7 days: °? Do not come in close contact with people. °? Do not sleep in the same bed as someone else. °? Do not hold children or babies. °? Do not have contact with pregnant women. °General instructions ° °· To prevent or treat constipation while you are taking prescription pain medicine, your health care provider may recommend that you: °? Drink enough fluid to keep your urine clear or pale yellow. °? Take over-the-counter or prescription medicines. °? Eat foods that are high in fiber, such as fresh fruits and vegetables, whole grains, and beans. °? Limit foods that are high in fat and processed sugars, such as fried and sweet foods. °· Eat frequent small meals until your appetite returns. Follow instructions from your health care provider about eating or drinking restrictions. °· Do not take baths, swim, or use a hot tub until your health care provider approves. You may take showers. Wash your puncture site with mild soap and water and pat the area dry. °· Wear compression stockings as told by your health care provider. These stockings help to prevent blood clots and reduce swelling in your legs. °· Keep all follow-up   visits as told by your health care provider. This is important. You may need to have blood tests and imaging tests done. °Contact a health care provider if: °· You have more redness, swelling, or pain around your puncture site. °· You have more fluid or blood coming from your puncture site. °· Your puncture site feels warm to the touch. °· You have pus or a bad smell coming from your puncture site. °· You have  pain that: °? Gets worse. °? Does not get better with medicine. °? Feels like very bad heartburn. °? Is in the middle of your abdomen, above your belly button. °· Your skin and the white parts of your eyes turn yellow (jaundice). °· The color of your urine changes to dark brown. °· The color of your stool changes to light yellow. °· Your abdominal measurement (girth) increases in a short period of time. °· You gain more than 5 lb (2.3 kg) in a short period of time. °Get help right away if: °· You have a fever that lasts longer than 2 weeks or is higher than what your health care provider told you to expect. °· You develop any of the following in your legs: °? Pain. °? Swelling. °? Skin that is cold or pale or turns blue. °· You have chest pain. °· You have blood in your vomit, saliva, or stool. °· You have trouble breathing. °This information is not intended to replace advice given to you by your health care provider. Make sure you discuss any questions you have with your health care provider. °Document Released: 04/05/2013 Document Revised: 12/29/2015 Document Reviewed: 12/29/2015 °Elsevier Interactive Patient Education © 2018 [employer] °Moderate Conscious Sedation, Adult, Care After °These instructions provide you with information about caring for yourself after your procedure. Your health care provider may also give you more specific instructions. Your treatment has been planned according to current medical practices, but problems sometimes occur. Call your health care provider if you have any problems or questions after your procedure. °What can I expect after the procedure? °After your procedure, it is common: °· To feel sleepy for several hours. °· To feel clumsy and have poor balance for several hours. °· To have poor judgment for several hours. °· To vomit if you eat too soon. ° °Follow these instructions at home: °For at least 24 hours after the procedure: ° °· Do not: °? Participate in activities where  you could fall or become injured. °? Drive. °? Use heavy machinery. °? Drink alcohol. °? Take sleeping pills or medicines that cause drowsiness. °? Make important decisions or sign legal documents. °? Take care of children on your own. °· Rest. °Eating and drinking °· Follow the diet recommended by your health care provider. °· If you vomit: °? Drink water, juice, or soup when you can drink without vomiting. °? Make sure you have little or no nausea before eating solid foods. °General instructions °· Have a responsible adult stay with you until you are awake and alert. °· Take over-the-counter and prescription medicines only as told by your health care provider. °· If you smoke, do not smoke without supervision. °· Keep all follow-up visits as told by your health care provider. This is important. °Contact a health care provider if: °· You keep feeling nauseous or you keep vomiting. °· You feel light-headed. °· You develop a rash. °· You have a fever. °Get help right away if: °· You have trouble breathing. °This information is not intended to   replace advice given to you by your health care provider. Make sure you discuss any questions you have with your health care provider. °Document Released: 01/19/2013 Document Revised: 09/03/2015 Document Reviewed: 07/21/2015 °Elsevier Interactive Patient Education © 2018 [employer] Inc. ° °

## 2017-12-08 ENCOUNTER — Encounter: Payer: Self-pay | Admitting: Gastroenterology

## 2017-12-08 ENCOUNTER — Ambulatory Visit (AMBULATORY_SURGERY_CENTER): Payer: Medicare HMO | Admitting: Gastroenterology

## 2017-12-08 VITALS — BP 140/66 | HR 48 | Temp 98.4°F | Resp 14 | Ht 67.0 in | Wt 132.0 lb

## 2017-12-08 DIAGNOSIS — K766 Portal hypertension: Secondary | ICD-10-CM | POA: Diagnosis not present

## 2017-12-08 DIAGNOSIS — K3189 Other diseases of stomach and duodenum: Secondary | ICD-10-CM | POA: Diagnosis not present

## 2017-12-08 DIAGNOSIS — K7460 Unspecified cirrhosis of liver: Secondary | ICD-10-CM | POA: Diagnosis present

## 2017-12-08 DIAGNOSIS — I8500 Esophageal varices without bleeding: Secondary | ICD-10-CM | POA: Diagnosis not present

## 2017-12-08 MED ORDER — SODIUM CHLORIDE 0.9 % IV SOLN: 500.0000 mL | Freq: Once | INTRAVENOUS | Status: DC

## 2017-12-08 MED ORDER — PANTOPRAZOLE SODIUM 40 MG PO TBEC: DELAYED_RELEASE_TABLET | ORAL | 3 refills | Status: DC

## 2017-12-08 NOTE — Patient Instructions (Signed)
YOU HAD AN ENDOSCOPIC PROCEDURE TODAY AT THE  ENDOSCOPY CENTER:   Refer to the procedure report that was given to you for any specific questions about what was found during the examination.  If the procedure report does not answer your questions, please call your gastroenterologist to clarify.  If you requested that your care partner not be given the details of your procedure findings, then the procedure report has been included in a sealed envelope for you to review at your convenience later.  YOU SHOULD EXPECT: Some feelings of bloating in the abdomen. Passage of more gas than usual.  Walking can help get rid of the air that was put into your GI tract during the procedure and reduce the bloating. If you had a lower endoscopy (such as a colonoscopy or flexible sigmoidoscopy) you may notice spotting of blood in your stool or on the toilet paper. If you underwent a bowel prep for your procedure, you may not have a normal bowel movement for a few days.  Please Note:  You might notice some irritation and congestion in your nose or some drainage.  This is from the oxygen used during your procedure.  There is no need for concern and it should clear up in a day or so.  SYMPTOMS TO REPORT IMMEDIATELY:   Following upper endoscopy (EGD)  Vomiting of blood or coffee ground material  New chest pain or pain under the shoulder blades  Painful or persistently difficult swallowing  New shortness of breath  Fever of 100F or higher  Black, tarry-looking stools  For urgent or emergent issues, a gastroenterologist can be reached at any hour by calling [phone].   DIET:  We do recommend a small meal at first, but then you may proceed to your regular diet.  Drink plenty of fluids but you should avoid alcoholic beverages for 24 hours.  ACTIVITY:  You should plan to take it easy for the rest of today and you should NOT DRIVE or use heavy machinery until tomorrow (because of the sedation medicines used  during the test).    FOLLOW UP: Our staff will call the number listed on your records the next business day following your procedure to check on you and address any questions or concerns that you may have regarding the information given to you following your procedure. If we do not reach you, we will leave a message.  However, if you are feeling well and you are not experiencing any problems, there is no need to return our call.  We will assume that you have returned to your regular daily activities without incident.  If any biopsies were taken you will be contacted by phone or by letter within the next 1-3 weeks.  Please call us at [phone] if you have not heard about the biopsies in 3 weeks.    SIGNATURES/CONFIDENTIALITY: You and/or your care partner have signed paperwork which will be entered into your electronic medical record.  These signatures attest to the fact that that the information above on your After Visit Summary has been reviewed and is understood.  Full responsibility of the confidentiality of this discharge information lies with you and/or your care-partner. 

## 2017-12-08 NOTE — Progress Notes (Signed)
A/ox3 pleased with MAC, report to RN 

## 2017-12-08 NOTE — Op Note (Signed)
[place] Patient Name: Zachary Steele Procedure Date: 12/08/2017 11:05 AM MRN: [id number] Endoscopist: Milus Banister , MD Age: 67 Referring MD:  Date of Birth: December 01, 1950 Gender: Male Account #: [id number] Procedure:                Upper GI endoscopy Indications:              Screening procedure, known cirrhosis (with                            hepatomas) Medicines:                Monitored Anesthesia Care Procedure:                Pre-Anesthesia Assessment:                           - Prior to the procedure, a History and Physical                            was performed, and patient medications and                            allergies were reviewed. The patient's tolerance of                            previous anesthesia was also reviewed. The risks                            and benefits of the procedure and the sedation                            options and risks were discussed with the patient.                            All questions were answered, and informed consent                            was obtained. Prior Anticoagulants: The patient has                            taken no previous anticoagulant or antiplatelet                            agents. ASA Grade Assessment: III - A patient with                            severe systemic disease. After reviewing the risks                            and benefits, the patient was deemed in                            satisfactory condition to undergo the procedure.  After obtaining informed consent, the endoscope was                            passed under direct vision. Throughout the                            procedure, the patient's blood pressure, pulse, and                            oxygen saturations were monitored continuously. The                            Endoscope was introduced through the mouth, and                            advanced to the second part of duodenum. The  upper                            GI endoscopy was accomplished without difficulty.                            The patient tolerated the procedure well.                           10 images were taken but did not 'capture' due to                            technical issues. Scope In: Scope Out: Findings:                 Two trunks of small (< 5 mm) varices without red                            wale signs were found in the lower third of the                            esophagus.                           Moderate portal gatropathy changes throughout the                            stomach.                           The exam was otherwise without abnormality. No                            gastric varices. Complications:            No immediate complications. Estimated blood loss:                            None. Estimated Blood Loss:     Estimated blood loss: none. Impression:               -  Small esophageal varices.                           - Portal gastropathy.                           - No gastric varices.                           - The examination was otherwise normal. Recommendation:           - Patient has a contact number available for                            emergencies. The signs and symptoms of potential                            delayed complications were discussed with the                            patient. Return to normal activities tomorrow.                            Written discharge instructions were provided to the                            patient.                           - Resume previous diet.                           - Continue present medications.                           - Repeat upper endoscopy in 2 years for                            surveillance. Milus Banister, MD 12/08/2017 11:24:18 AM This report has been signed electronically.

## 2017-12-09 ENCOUNTER — Telehealth: Payer: Self-pay | Admitting: *Deleted

## 2017-12-09 ENCOUNTER — Telehealth: Payer: Self-pay

## 2017-12-09 ENCOUNTER — Other Ambulatory Visit (HOSPITAL_COMMUNITY): Payer: Self-pay | Admitting: Interventional Radiology

## 2017-12-09 DIAGNOSIS — C220 Liver cell carcinoma: Secondary | ICD-10-CM

## 2017-12-09 NOTE — Telephone Encounter (Signed)
Second follow up phone call attempt, no answer, message left 

## 2017-12-09 NOTE — Telephone Encounter (Signed)
No answer, left message to call if questions or conerns,

## 2017-12-21 ENCOUNTER — Inpatient Hospital Stay: Admission: RE | Admit: 2017-12-21 | Payer: Self-pay | Source: Ambulatory Visit

## 2017-12-21 ENCOUNTER — Ambulatory Visit
Admission: RE | Admit: 2017-12-21 | Discharge: 2017-12-21 | Disposition: A | Discharge status: Home / Self Care | Payer: Medicare HMO | Source: Ambulatory Visit | Attending: Nurse Practitioner | Admitting: Nurse Practitioner

## 2017-12-21 DIAGNOSIS — D751 Secondary polycythemia: Principal | ICD-10-CM

## 2017-12-21 DIAGNOSIS — C220 Liver cell carcinoma: Secondary | ICD-10-CM

## 2017-12-21 MED ORDER — IOPAMIDOL (ISOVUE-300) INJECTION 61%
75.0000 mL | Freq: Once | INTRAVENOUS | Status: AC | PRN
  Administered 2017-12-21: 75 mL via INTRAVENOUS

## 2018-01-07 ENCOUNTER — Encounter: Payer: Self-pay | Admitting: Radiology

## 2018-01-07 ENCOUNTER — Ambulatory Visit
Admission: RE | Admit: 2018-01-07 | Discharge: 2018-01-07 | Disposition: A | Discharge status: Home / Self Care | Payer: Medicare HMO | Source: Ambulatory Visit | Attending: Interventional Radiology | Admitting: Interventional Radiology

## 2018-01-07 DIAGNOSIS — C220 Liver cell carcinoma: Secondary | ICD-10-CM

## 2018-01-07 HISTORY — PX: IR RADIOLOGIST EVAL & MGMT: IMG5224

## 2018-01-07 NOTE — Progress Notes (Signed)
Patient ID: Zachary Steele, male   DOB: 1950/12/06, 67 y.o.   MRN: [id number]       Chief Complaint:   1 month status post second Y 90 radioembolization for multifocal hepatocellular carcinoma  Referring Physician(s): Drazek  History of Present Illness: Zachary Steele is a 67 y.o. male with chronic hepatitis C, previous alcoholism, cirrhosis, and multifocal hepatocellular carcinoma.  He is now status post right and left Y 90  embolizations.  Most recently, he underwent left hepatic Y 90 embolization 12/04/2017.  He returns for outpatient follow-up.  He reports minor fatigue but overall this is improving.  No significant abdominal pain or flank pain.  No signs of liver failure or jaundice.  He is back to work.  No physical limitations.  Stable weight and appetite.  He no longer drinks alcohol.  He smokes occasionally.  Stable functional status.  Overall he is tolerated the 2  Y 90 treatments well.  No follow-up imaging at this point.  Past Medical History:  Diagnosis Date  . Alcohol dependence ([place])   . Anemia    denies  . Arthritis   . Cataract   . Cirrhosis ([place])   . Coronary artery disease due to lipid rich plaque   . Depression   . Hepatitis C    Has A and B also  . PAD (peripheral artery disease) ([place])    pt reports he has this and has chronic leg "soreness"  . Peripheral vascular disease ([place])    essentially normal circulations with two-vessel  runoff to the feet bilaterally  . Tobacco abuse     Past Surgical History:  Procedure Laterality Date  . CARDIAC CATHETERIZATION    . COLONOSCOPY    . CORONARY ARTERY BYPASS GRAFT  2010   Triple bypass at [facility]  . DOPPLER ECHOCARDIOGRAPHY  2011  . IR ANGIOGRAM SELECTIVE EACH ADDITIONAL VESSEL  10/27/2017  . IR ANGIOGRAM SELECTIVE EACH ADDITIONAL VESSEL  10/27/2017  . IR ANGIOGRAM SELECTIVE EACH ADDITIONAL VESSEL  10/27/2017  . IR ANGIOGRAM SELECTIVE EACH ADDITIONAL VESSEL  10/27/2017  . IR ANGIOGRAM SELECTIVE EACH ADDITIONAL VESSEL   10/27/2017  . IR ANGIOGRAM SELECTIVE EACH ADDITIONAL VESSEL  10/27/2017  . IR ANGIOGRAM SELECTIVE EACH ADDITIONAL VESSEL  10/27/2017  . IR ANGIOGRAM SELECTIVE EACH ADDITIONAL VESSEL  11/06/2017  . IR ANGIOGRAM SELECTIVE EACH ADDITIONAL VESSEL  11/06/2017  . IR ANGIOGRAM SELECTIVE EACH ADDITIONAL VESSEL  11/06/2017  . IR ANGIOGRAM SELECTIVE EACH ADDITIONAL VESSEL  12/04/2017  . IR ANGIOGRAM SELECTIVE EACH ADDITIONAL VESSEL  12/04/2017  . IR ANGIOGRAM VISCERAL SELECTIVE  10/27/2017  . IR ANGIOGRAM VISCERAL SELECTIVE  11/06/2017  . IR ANGIOGRAM VISCERAL SELECTIVE  12/04/2017  . IR EMBO ARTERIAL NOT HEMORR HEMANG INC GUIDE ROADMAPPING  10/27/2017  . IR EMBO TUMOR ORGAN ISCHEMIA INFARCT INC GUIDE ROADMAPPING  11/06/2017  . IR EMBO TUMOR ORGAN ISCHEMIA INFARCT INC GUIDE ROADMAPPING  12/04/2017  . IR RADIOLOGIST EVAL & MGMT  08/12/2017  . IR RADIOLOGIST EVAL & MGMT  09/22/2017  . IR RADIOLOGIST EVAL & MGMT  01/07/2018  . IR US GUIDE VASC ACCESS RIGHT  10/27/2017  . IR US GUIDE VASC ACCESS RIGHT  11/06/2017  . IR US GUIDE VASC ACCESS RIGHT  12/04/2017  . NM MYOVIEW LTD  2012    Allergies: Statins  Medications: Prior to Admission medications   Medication Sig Start Date End Date Taking? Authorizing Provider  acetaminophen (TYLENOL) 500 MG tablet Take 1,000 mg by mouth every 6 (six)  hours as needed.   Yes [provider]  aspirin 81 MG tablet Take 81 mg by mouth daily.   Yes [provider]  Cholecalciferol (VITAMIN D3 PO) Take 125 mcg by mouth daily.   Yes [provider]  Liver Extract (LIVER PO) Take by mouth.   Yes [provider]  Multiple Vitamin (MULTIVITAMIN WITH MINERALS) TABS tablet Take 1 tablet by mouth daily.   Yes [provider]  pantoprazole (PROTONIX) 40 MG tablet 40 mg one tab 30 minutes before breakfast meal 12/08/17  Yes Milus Banister, MD     Family History  Problem Relation Age of Onset  . Stroke Mother   . Colon cancer Neg Hx     Social  History   Socioeconomic History  . Marital status: Married    Spouse name: Not on file  . Number of children: 1  . Years of education: Not on file  . Highest education level: Not on file  Occupational History  . Occupation: Retail buyer    Comment: city of [place]  . Financial resource strain: Not on file  . Food insecurity:    Worry: Not on file    Inability: Not on file  . Transportation needs:    Medical: Not on file    Non-medical: Not on file  Tobacco Use  . Smoking status: Current Some Day Smoker    Packs/day: 0.25    Years: 52.00    Pack years: 13.00    Types: Cigarettes  . Smokeless tobacco: Never Used  . Tobacco comment: Is trying to stop smoking, still has one or two per day most days  Substance and Sexual Activity  . Alcohol use: No    Frequency: Never    Comment: quit 04/2017; previous 40 year history 1 wine bottle per day  . Drug use: No    Comment: occ  . Sexual activity: Yes  Lifestyle  . Physical activity:    Days per week: Not on file    Minutes per session: Not on file  . Stress: Not on file  Relationships  . Social connections:    Talks on phone: Not on file    Gets together: Not on file    Attends religious service: Not on file    Active member of club or organization: Not on file    Attends meetings of clubs or organizations: Not on file    Relationship status: Not on file  Other Topics Concern  . Not on file  Social History Narrative  . Not on file    ECOG Status: 1 - Symptomatic but completely ambulatory  Review of Systems: A 12 point ROS discussed and pertinent positives are indicated in the HPI above.  All other systems are negative.  Review of Systems  Vital Signs: BP 132/72   Pulse 68   Temp 97.7 F (36.5 C)   Resp 15   Ht 5\' 7"  (1.702 m)   Wt 61.2 kg   SpO2 100%   BMI 21.14 kg/m   Physical Exam  Constitutional: He appears well-developed and well-nourished. No distress.  Eyes: Conjunctivae are  normal. No scleral icterus.  Cardiovascular: Normal rate, regular rhythm and intact distal pulses.  Pulmonary/Chest: Effort normal and breath sounds normal.  Abdominal: Soft. Bowel sounds are normal. He exhibits no mass.  Skin: He is not diaphoretic.      Imaging: Ct Chest W Contrast  Result Date: 12/21/2017 CLINICAL DATA:  67 year old male  with history of hepatocellular carcinoma. Evaluate for metastatic disease. EXAM: CT CHEST WITH CONTRAST TECHNIQUE: Multidetector CT imaging of the chest was performed during intravenous contrast administration. CONTRAST:  82mL ISOVUE-300 IOPAMIDOL (ISOVUE-300) INJECTION 61% COMPARISON:  Chest CT 09/19/2011. FINDINGS: Cardiovascular: Heart size is normal. There is no significant pericardial fluid, thickening or pericardial calcification. There is aortic atherosclerosis, as well as atherosclerosis of the great vessels of the mediastinum and the coronary arteries, including calcified atherosclerotic plaque in the left main, left anterior descending, left circumflex and right coronary arteries. Status post median sternotomy for CABG including LIMA to the LAD. Mediastinum/Nodes: No pathologically enlarged mediastinal or hilar lymph nodes. There is a prominent but nonenlarged enhancing juxtapericardiac lymph node in the anterior mediastinum (axial image 104 of series 2). Multiple densely calcified mediastinal lymph nodes are incidentally noted. Esophagus is unremarkable in appearance. No axillary lymphadenopathy. Lungs/Pleura: No suspicious appearing pulmonary nodules or masses are noted. No acute consolidative airspace disease. No pleural effusions. Diffuse bronchial wall thickening with mild centrilobular and moderate paraseptal emphysema most notably in the lung apices. Areas of ground-glass attenuation, septal thickening, subpleural reticulation, mild cylindrical bronchiectasis and peripheral bronchiolectasis are noted in the lung bases bilaterally, most severe in the  left lower lobe. Calcified granuloma in the inferior segment of the lingula. Upper Abdomen: Heterogeneously enhancing mass in the central aspect of the liver (axial image 133 of series 2 and coronal image 50 of series 4) measuring 4.8 x 5.9 x 6.7 cm. Embolization coils noted in the upper abdominal vasculature. Cholelithiasis. Aortic atherosclerosis. Calcified granulomas throughout the spleen. Musculoskeletal: Median sternotomy wires. There are no aggressive appearing lytic or blastic lesions noted in the visualized portions of the skeleton. IMPRESSION: 1. 4.8 x 5.9 x 6.7 cm heterogeneously enhancing hepatic mass compatible with the reported clinical history of hepatocellular carcinoma. No definite metastatic disease noted in the thorax, although there is a slightly prominent anterior mediastinal lymph node measuring 7 mm in short axis in the juxta pericardiac nodal station. This is nonspecific but warrants attention on follow-up studies. 2. Mild diffuse bronchial wall thickening with mild centrilobular and moderate paraseptal emphysema; imaging findings suggestive of underlying COPD. 3. In addition, there is evidence of probable interstitial lung disease in the lung bases. Outpatient referral to Pulmonology for further evaluation could be considered if clinically appropriate. 4. Aortic atherosclerosis, in addition to left main and 3 vessel coronary artery disease. Status post median sternotomy for CABG including LIMA to the LAD. Aortic Atherosclerosis (ICD10-I70.0) and Emphysema (ICD10-J43.9). Electronically Signed   By: Vinnie Langton M.D.   On: 12/21/2017 20:59   Ir Radiologist Eval & Mgmt  Result Date: 01/07/2018 Please refer to notes tab for details about interventional procedure. (Op Note)   Labs:  CBC: Recent Labs    09/22/17 1043 10/27/17 0811 11/06/17 0746 12/04/17 0800  WBC 4.4 5.3 4.6 4.5  HGB 12.5* 13.3 12.8* 12.3*  HCT 36.3* 38.8* 36.9* 35.8*  PLT 100.0* 107* 137* 130*     COAGS: Recent Labs    09/22/17 1043 10/27/17 0811 11/06/17 0746 12/04/17 0800  INR 1.1* 1.09 1.09 1.00  APTT  --  30 30  --     BMP: Recent Labs    05/19/17 1010 09/22/17 1043 10/27/17 0811 11/06/17 0746 12/04/17 0850  NA 140 142 141 140 141  K 3.9 3.8 4.1 3.6 3.7  CL 103 106 107 106 106  CO2 22 29 24 27 26   GLUCOSE 83 106* 88 90 82  BUN 5* 5*  8 7* 7*  CALCIUM 9.0 9.5 9.4 9.5 9.2  CREATININE 0.72* 0.78 0.65 0.69 0.70  GFRNONAA 97  --  >60 >60 >60  GFRAA 112  --  >60 >60 >60    LIVER FUNCTION TESTS: Recent Labs    09/22/17 1043 10/27/17 0811 11/06/17 0746 12/04/17 0850  BILITOT 0.6 0.5 0.8 1.0  AST 37 52* 36 29  ALT 21 28 22 15   ALKPHOS 114 108 85 85  PROT 7.3 7.7 7.7 7.5  ALBUMIN 3.4* 3.4* 3.4* 3.3*    TUMOR MARKERS: Recent Labs    09/22/17 1043  AFPTM 5,417.1*    Assessment and Plan:  Multifocal hepatocellular carcinoma in the setting of hepatitis C and cirrhosis.  He is now status post bilateral Y 90 embolizations most recently 1 month ago.  He is recovered very well.  The treatments and imaging were reviewed.  All questions were addressed.  No further treatment at this time.  Plan: Surveillance MRI in 3 months (December 2019).  Thank you for this interesting consult.  I greatly enjoyed meeting Zachary Steele and look forward to participating in their care.  A copy of this report was sent to the requesting provider on this date.  Electronically Signed: Greggory Keen 01/07/2018, 10:57 AM   I spent a total of    25 Minutes in face to face in clinical consultation, greater than 50% of which was counseling/coordinating care for this patient with hepatocellular carcinoma

## 2018-01-11 ENCOUNTER — Ambulatory Visit (INDEPENDENT_AMBULATORY_CARE_PROVIDER_SITE_OTHER): Payer: Medicare HMO | Admitting: Family Medicine

## 2018-01-11 ENCOUNTER — Encounter: Payer: Self-pay | Admitting: Family Medicine

## 2018-01-11 VITALS — BP 120/70 | HR 68 | Wt 127.6 lb

## 2018-01-11 DIAGNOSIS — M79645 Pain in left finger(s): Secondary | ICD-10-CM | POA: Insufficient documentation

## 2018-01-11 DIAGNOSIS — F17210 Nicotine dependence, cigarettes, uncomplicated: Secondary | ICD-10-CM

## 2018-01-11 DIAGNOSIS — C220 Liver cell carcinoma: Secondary | ICD-10-CM | POA: Diagnosis not present

## 2018-01-11 DIAGNOSIS — R599 Enlarged lymph nodes, unspecified: Secondary | ICD-10-CM

## 2018-01-11 DIAGNOSIS — Z951 Presence of aortocoronary bypass graft: Secondary | ICD-10-CM | POA: Diagnosis not present

## 2018-01-11 DIAGNOSIS — J449 Chronic obstructive pulmonary disease, unspecified: Secondary | ICD-10-CM | POA: Insufficient documentation

## 2018-01-11 DIAGNOSIS — M79644 Pain in right finger(s): Secondary | ICD-10-CM

## 2018-01-11 DIAGNOSIS — F17200 Nicotine dependence, unspecified, uncomplicated: Secondary | ICD-10-CM | POA: Diagnosis not present

## 2018-01-11 DIAGNOSIS — G8929 Other chronic pain: Secondary | ICD-10-CM

## 2018-01-11 DIAGNOSIS — I2510 Atherosclerotic heart disease of native coronary artery without angina pectoris: Secondary | ICD-10-CM

## 2018-01-11 DIAGNOSIS — I700 Atherosclerosis of aorta: Secondary | ICD-10-CM

## 2018-01-11 DIAGNOSIS — Z5309 Procedure and treatment not carried out because of other contraindication: Secondary | ICD-10-CM | POA: Insufficient documentation

## 2018-01-11 DIAGNOSIS — M79646 Pain in unspecified finger(s): Secondary | ICD-10-CM

## 2018-01-11 NOTE — Patient Instructions (Signed)
You will receive a call from the pulmonologist office and the hand specialist.   Cut back on fried foods, fatty foods, creamy sauces and dressings.    Carotid Artery Disease The carotid arteries are arteries on both sides of the neck. They carry blood to the brain. Carotid artery disease is when the arteries get smaller (narrow) or get blocked. If these arteries get smaller or get blocked, you are more likely to have a stroke or warning stroke (transient ischemic attack). Follow these instructions at home:  Take medicines as told by your doctor. Make sure you understand all your medicine instructions. Do not stop your medicines without talking to your doctor first.  Follow your doctor's diet instructions. It is important to eat a healthy diet that includes plenty of: ? Fresh fruits. ? Vegetables. ? Lean meats.  Avoid: ? High-fat foods. ? High-sodium foods. ? Foods that are fried, overly processed, or have poor nutritional value.  Stay a healthy weight.  Stay active. Get at least 30 minutes of activity every day.  Do not smoke.  Limit alcohol use to: ? No more than 2 drinks a day for men. ? No more than 1 drink a day for women who are not pregnant.  Do not use illegal drugs.  Keep all doctor visits as told. Get help right away if:  You have sudden weakness or loss of feeling (numbness) on one side of the body, such as the face, arm, or leg.  You have sudden confusion.  You have trouble speaking (aphasia) or understanding.  You have sudden trouble seeing out of one or both eyes.  You have sudden trouble walking.  You have dizziness or feel like you might pass out (faint).  You have a loss of balance or your movements are not steady (uncoordinated).  You have a sudden, severe headache with no known cause.  You have trouble swallowing (dysphagia). Call your local emergency services (911 in [place] Do notdrive yourself to the clinic or hospital. This information is  not intended to replace advice given to you by your health care provider. Make sure you discuss any questions you have with your health care provider. Document Released: 03/17/2012 Document Revised: 09/06/2015 Document Reviewed: 09/29/2012 Elsevier Interactive Patient Education  [employer].

## 2018-01-11 NOTE — Progress Notes (Signed)
   Subjective:    Patient ID: Zachary Steele, male    DOB: 06-05-1950, 67 y.o.   MRN: [id number]  HPI Chief Complaint  Patient presents with  . discuss CT    discuss CT chest findings.   He is here with concerns regarding recent CT chest results. This was performed due to liver cancer and ordered by Dr. Annamaria Boots. He is followed closely by his liver specialist.   Findings on CT showed no definite metastatic disease in the thorax however there was findings of emphysema, interstitial lung disease and enlarged mediastinal lymph node.  Denies fever, chills, night sweats, chest pain, palpitations, DOE, hemoptysis.   Hx of cabg and findings of left main and 3 vessel CAD along with aortic atherosclerosis. Unable to take statins due to liver cancer and cirrhosis.  He was evaluated by Dr. Gwenlyn Found earlier this year.   He is motivated to stop smoking. Now only smoking 1-2 cigarettes per day.  No alcohol in 11 months per patient.   Other providers:  Dr. Gwenlyn Found - cardiology.  Dr. Annamaria Boots- liver specialist Oncologist - Cira Rue , NP [facility] - Dr. Oneida Alar   Bilateral hand pain for years predominantly at the base of his thumbs.  Gripping is painful and difficult for him and now affecting his job. He works for the [employer].  He has tried Tylenol and has been needing this daily lately.  No other arthralgias or myalgias.   Reviewed allergies, medications, past medical, surgical, family, and social history.    Review of Systems Pertinent positives and negatives in the history of present illness.     Objective:   Physical Exam BP 120/70   Pulse 68   Wt 127 lb 9.6 oz (57.9 kg)   BMI 19.98 kg/m   Alert and in no distress. Pharyngeal area is normal. Neck is supple without adenopathy or thyromegaly. Cardiac exam shows a regular sinus rhythm without murmurs or gallops. Lungs are clear upper and diminished in the bases. Extremities without edema, pulses intact. Bilateral hand tenderness at  [facility] joints, no erythema or edema. pain in [facility] joints with gripping.  Skin is warm and dry.       Assessment & Plan:  Chronic obstructive pulmonary disease, unspecified COPD type (Post) - Plan: Ambulatory referral to Pulmonology  Hepatocellular carcinoma ([place])  Hx of CABG  Cigarette smoker motivated to quit - Plan: Ambulatory referral to Pulmonology  Atherosclerosis of aorta [facility])  3-vessel CAD  Enlarged lymph node - Plan: Ambulatory referral to Pulmonology  Chronic thumb pain, bilateral - Plan: Ambulatory referral to Hand Surgery  Statins contraindicated  Smoking cessation counseling done. He is motivated to stop.  Referral to pulmonology per recommendations by radiology for further evaluation of COPD and ? interstitial lung disease.  He will continue to follow with liver specialist for cirrhosis and liver cancer.  Cannot take statins. He is aware of recent atherosclerosis findings. Discussed healthy diet. Follow up with cardiology as recommended.  Referral to hand specialists for chronic [facility] joint pain affecting his job.  Follow up as needed.

## 2018-02-15 ENCOUNTER — Other Ambulatory Visit: Payer: Medicare HMO

## 2018-02-15 ENCOUNTER — Ambulatory Visit (INDEPENDENT_AMBULATORY_CARE_PROVIDER_SITE_OTHER): Payer: Medicare HMO | Admitting: Pulmonary Disease

## 2018-02-15 ENCOUNTER — Encounter: Payer: Self-pay | Admitting: Pulmonary Disease

## 2018-02-15 VITALS — BP 116/68 | HR 66 | Ht 67.0 in | Wt 132.2 lb

## 2018-02-15 DIAGNOSIS — Z23 Encounter for immunization: Secondary | ICD-10-CM

## 2018-02-15 DIAGNOSIS — Z720 Tobacco use: Secondary | ICD-10-CM | POA: Diagnosis not present

## 2018-02-15 DIAGNOSIS — J849 Interstitial pulmonary disease, unspecified: Secondary | ICD-10-CM | POA: Diagnosis not present

## 2018-02-15 DIAGNOSIS — J439 Emphysema, unspecified: Secondary | ICD-10-CM

## 2018-02-15 MED ORDER — ALBUTEROL SULFATE HFA 108 (90 BASE) MCG/ACT IN AERS: 2.0000 | INHALATION_SPRAY | Freq: Four times a day (QID) | RESPIRATORY_TRACT | 3 refills | Status: DC | PRN

## 2018-02-15 MED ORDER — ALBUTEROL SULFATE HFA 108 (90 BASE) MCG/ACT IN AERS: 2.0000 | INHALATION_SPRAY | Freq: Four times a day (QID) | RESPIRATORY_TRACT | 6 refills | Status: AC | PRN

## 2018-02-15 NOTE — Progress Notes (Signed)
[name]  Chief Complaint  Patient presents with  . pulm consult    Pt referred by Dr. Harland Dingwall MD. Pt has SOB with exertion with dizziness and productive cough-yellow. Pt is not sure if he had flu shot this year.    Constitutional:  BP 116/68 (BP Location: Left Arm, Cuff Size: Normal)   Pulse 66   Ht 5\' 7"  (1.702 m)   Wt 132 lb 3.2 oz (60 kg)   SpO2 100%   BMI 20.71 kg/m   Past Medical History:  ETOH, Anemia, OA, Cirrhosis, CAD s/p CABG, Depression, Hep C, PAD, hepatocellular Ca  Brief Summary:  Zachary Steele is a 67 y.o. male smoker with dyspnea.  He has hx of ETOH and Hep c.  Found to have cirrhosis and hepatocellular carcinoma.  Had CT chest for staging.  Found to have emphysema and changes of ILD.  He is down to smoking 2 cigarettes per day.  Quit drinking alcohol in January 2019.    Gets intermittent cough with clear sputum.  Denies chest pain, fever, hemoptysis, wheezing, skin rash, or gland swelling.  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  He does okay with level ground, but gets winded when going up hills.  He sometimes feels like the air doesn't get in, and then he can feel dizzy for a few seconds.  He also notices more trouble in cool/windy weather.  He is not on any inhaler medications at present.  No history of pneumonia or TB.  Physical Exam:   Appearance - well kempt, thin  ENMT - clear nasal mucosa, midline nasal  septum, no oral exudates, no LAN, trachea midline  Respiratory - normal chest wall, normal respiratory effort, no accessory muscle use, no wheeze/rales  CV - s1s2 regular rate and rhythm, no murmurs, no peripheral edema, radial pulses symmetric  GI - soft, non tender, no masses  Lymph - no adenopathy noted in neck and axillary areas  MSK - normal gait, decreased muscle bulk  Ext - no cyanosis, clubbing, or joint inflammation noted  Skin - no rashes, lesions, or ulcers  Neuro -  normal strength, oriented x 3  Psych - normal mood and affect   Assessment/Plan:   Centrilobular and paraseptal emphysema. - will check A1AT level and PFT - prn proair - high dose flu shot today  Mild changes of ILD on CT chest. - PFT, Lab work  Hx of Hep C, ETOH with cirrhosis. - followed by  GI  Hepatocellular Carcinoma. - followed by Dr. Burr Medico with oncology  Tobacco abuse. - discussed options to assist with smoking cessation - he will try quitting on his own   Patient Instructions  High dose flu shot today Lab tests today Will schedule pulmonary function test Proair two puffs every 6 hours as needed for cough, wheeze, chest congestion or shortness of breath Follow up in 4 weeks with Dr.Chene Kasinger or Nurse practitioner    Chesley Mires, MD [place] Pager: [phone] 02/15/2018, 11:08 AM  Flow Sheet     Pulmonary tests:  CT chest 12/21/17 >> diffuse bronchial wall thickening, centrilobular and paraseptal emphysema, areas of GGO, septal thickening, subpleural reticulation, cylindrical BTX, 6.7 cm mass in liver  Review of Systems:  Constitutional: Negative for fever and unexpected weight change.  HENT: Negative for congestion, dental problem, ear pain, nosebleeds, postnasal drip, rhinorrhea, sinus pressure, sneezing, sore throat and trouble swallowing.   Eyes: Negative for redness and  itching.  Respiratory: Positive for cough and shortness of breath. Negative for chest tightness and wheezing.   Cardiovascular: Positive for leg swelling. Negative for palpitations.  Gastrointestinal: Negative for nausea and vomiting.  Genitourinary: Negative for dysuria.  Musculoskeletal: Positive for joint swelling.  Skin: Negative for rash.  Allergic/Immunologic: Negative.  Negative for environmental allergies, food allergies and immunocompromised state.  Neurological: Negative for headaches.  Hematological: Does not bruise/bleed easily.    Psychiatric/Behavioral: Negative for dysphoric mood. The patient is not nervous/anxious.    Medications:   Allergies as of 02/15/2018      Reactions   Statins Other (See Comments)   Alcoholic cirrhosis, cardiology has declined to start statin      Medication List        Accurate as of 02/15/18 11:08 AM. Always use your most recent med list.          acetaminophen 500 MG tablet Commonly known as:  TYLENOL Take 1,000 mg by mouth every 6 (six) hours as needed.   albuterol 108 (90 Base) MCG/ACT inhaler Commonly known as:  PROVENTIL HFA;VENTOLIN HFA Inhale 2 puffs into the lungs every 6 (six) hours as needed for wheezing or shortness of breath.   aspirin 81 MG tablet Take 81 mg by mouth daily.   LIVER PO Take by mouth.   multivitamin with minerals Tabs tablet Take 1 tablet by mouth daily.   pantoprazole 40 MG tablet Commonly known as:  PROTONIX 40 mg one tab 30 minutes before breakfast meal   VITAMIN D3 PO Take 125 mcg by mouth daily.       Past Surgical History:  He  has a past surgical history that includes Coronary artery bypass graft (2010); doppler echocardiography (2011); NM MYOVIEW LTD (2012); Cardiac catheterization; IR Radiologist Eval & Mgmt (08/12/2017); IR Radiologist Eval & Mgmt (09/22/2017); IR US Guide Vasc Access Right (10/27/2017); IR Angiogram Selective Each Additional Vessel (10/27/2017); IR Angiogram Selective Each Additional Vessel (10/27/2017); IR Angiogram Visceral Selective (10/27/2017); IR Angiogram Selective Each Additional Vessel (10/27/2017); IR Angiogram Selective Each Additional Vessel (10/27/2017); IR Angiogram Selective Each Additional Vessel (10/27/2017); IR EMBO ARTERIAL NOT HEMORR HEMANG INC GUIDE ROADMAPPING (10/27/2017); IR Angiogram Selective Each Additional Vessel (10/27/2017); IR Angiogram Selective Each Additional Vessel (10/27/2017); IR Angiogram Selective Each Additional Vessel (11/06/2017); IR Angiogram Selective Each Additional Vessel  (11/06/2017); IR EMBO TUMOR ORGAN ISCHEMIA INFARCT INC GUIDE ROADMAPPING (11/06/2017); IR Angiogram Visceral Selective (11/06/2017); IR Angiogram Selective Each Additional Vessel (11/06/2017); IR US Guide Vasc Access Right (11/06/2017); IR EMBO TUMOR ORGAN ISCHEMIA INFARCT INC GUIDE ROADMAPPING (12/04/2017); IR US Guide Vasc Access Right (12/04/2017); IR Angiogram Visceral Selective (12/04/2017); IR Angiogram Selective Each Additional Vessel (12/04/2017); IR Angiogram Selective Each Additional Vessel (12/04/2017); Colonoscopy; and IR Radiologist Eval & Mgmt (01/07/2018).  Family History:  His family history includes Stroke in his mother.  Social History:  He  reports that he has been smoking cigarettes. He has a 13.00 pack-year smoking history. He has never used smokeless tobacco. He reports that he does not drink alcohol or use drugs.

## 2018-02-15 NOTE — Addendum Note (Signed)
Addended by: Georjean Mode on: 02/15/2018 11:20 AM   Modules accepted: Orders

## 2018-02-15 NOTE — Progress Notes (Signed)
   Subjective:    Patient ID: Zachary Steele, male    DOB: 1951/02/28, 67 y.o.   MRN: [id number]  HPI    Review of Systems  Constitutional: Negative for fever and unexpected weight change.  HENT: Negative for congestion, dental problem, ear pain, nosebleeds, postnasal drip, rhinorrhea, sinus pressure, sneezing, sore throat and trouble swallowing.   Eyes: Negative for redness and itching.  Respiratory: Positive for cough and shortness of breath. Negative for chest tightness and wheezing.   Cardiovascular: Positive for leg swelling. Negative for palpitations.  Gastrointestinal: Negative for nausea and vomiting.  Genitourinary: Negative for dysuria.  Musculoskeletal: Positive for joint swelling.  Skin: Negative for rash.  Allergic/Immunologic: Negative.  Negative for environmental allergies, food allergies and immunocompromised state.  Neurological: Negative for headaches.  Hematological: Does not bruise/bleed easily.  Psychiatric/Behavioral: Negative for dysphoric mood. The patient is not nervous/anxious.        Objective:   Physical Exam        Assessment & Plan:

## 2018-02-15 NOTE — Patient Instructions (Signed)
High dose flu shot today Lab tests today Will schedule pulmonary function test Proair two puffs every 6 hours as needed for cough, wheeze, chest congestion or shortness of breath Follow up in 4 weeks with Dr.Teondra Newburg or Nurse practitioner

## 2018-02-19 LAB — ANCA SCREEN W REFLEX TITER
ANCA Screen: POSITIVE — AB
C-ANCA Titer: 1:40 {titer} — ABNORMAL HIGH

## 2018-02-19 LAB — RHEUMATOID FACTOR: Rhuematoid fact SerPl-aCnc: 145 IU/mL — ABNORMAL HIGH (ref <14)

## 2018-02-19 LAB — ALPHA-1 ANTITRYPSIN PHENOTYPE: A-1 Antitrypsin, Ser: 201 mg/dL — ABNORMAL HIGH (ref 83–199)

## 2018-02-25 ENCOUNTER — Inpatient Hospital Stay: Payer: Medicare HMO

## 2018-02-25 ENCOUNTER — Encounter: Payer: Self-pay | Admitting: Hematology

## 2018-02-25 ENCOUNTER — Telehealth: Payer: Self-pay | Admitting: Hematology

## 2018-02-25 ENCOUNTER — Inpatient Hospital Stay: Payer: Medicare HMO | Attending: Hematology | Admitting: Hematology

## 2018-02-25 VITALS — BP 124/75 | HR 69 | Temp 97.6°F | Resp 18 | Ht 67.0 in | Wt 131.9 lb

## 2018-02-25 DIAGNOSIS — D696 Thrombocytopenia, unspecified: Secondary | ICD-10-CM | POA: Diagnosis not present

## 2018-02-25 DIAGNOSIS — B182 Chronic viral hepatitis C: Secondary | ICD-10-CM | POA: Diagnosis not present

## 2018-02-25 DIAGNOSIS — R05 Cough: Secondary | ICD-10-CM

## 2018-02-25 DIAGNOSIS — J449 Chronic obstructive pulmonary disease, unspecified: Secondary | ICD-10-CM | POA: Insufficient documentation

## 2018-02-25 DIAGNOSIS — C220 Liver cell carcinoma: Secondary | ICD-10-CM

## 2018-02-25 DIAGNOSIS — Z720 Tobacco use: Secondary | ICD-10-CM

## 2018-02-25 LAB — CBC WITH DIFFERENTIAL (CANCER CENTER ONLY)
Abs Immature Granulocytes: 0 10*3/uL (ref 0.00–0.07)
Basophils Absolute: 0 10*3/uL (ref 0.0–0.1)
Basophils Relative: 1 %
Eosinophils Absolute: 0.3 10*3/uL (ref 0.0–0.5)
Eosinophils Relative: 7 %
HCT: 38 % — ABNORMAL LOW (ref 39.0–52.0)
Hemoglobin: 12.6 g/dL — ABNORMAL LOW (ref 13.0–17.0)
Immature Granulocytes: 0 %
Lymphocytes Relative: 22 %
Lymphs Abs: 0.9 10*3/uL (ref 0.7–4.0)
MCH: 31.3 pg (ref 26.0–34.0)
MCHC: 33.2 g/dL (ref 30.0–36.0)
MCV: 94.5 fL (ref 80.0–100.0)
Monocytes Absolute: 0.5 10*3/uL (ref 0.1–1.0)
Monocytes Relative: 11 %
Neutro Abs: 2.4 10*3/uL (ref 1.7–7.7)
Neutrophils Relative %: 59 %
Platelet Count: 120 10*3/uL — ABNORMAL LOW (ref 150–400)
RBC: 4.02 MIL/uL — ABNORMAL LOW (ref 4.22–5.81)
RDW: 13.8 % (ref 11.5–15.5)
WBC Count: 4.1 10*3/uL (ref 4.0–10.5)
nRBC: 0 % (ref 0.0–0.2)

## 2018-02-25 LAB — CMP (CANCER CENTER ONLY)
ALT: 18 U/L (ref 0–44)
AST: 36 U/L (ref 15–41)
Albumin: 3.3 g/dL — ABNORMAL LOW (ref 3.5–5.0)
Alkaline Phosphatase: 128 U/L — ABNORMAL HIGH (ref 38–126)
Anion gap: 9 (ref 5–15)
BUN: 6 mg/dL — ABNORMAL LOW (ref 8–23)
CO2: 28 mmol/L (ref 22–32)
Calcium: 9.6 mg/dL (ref 8.9–10.3)
Chloride: 106 mmol/L (ref 98–111)
Creatinine: 0.88 mg/dL (ref 0.61–1.24)
GFR, Est AFR Am: >60 mL/min (ref >60)
GFR, Estimated: >60 mL/min (ref >60)
Glucose, Bld: 116 mg/dL — ABNORMAL HIGH (ref 70–99)
Potassium: 4 mmol/L (ref 3.5–5.1)
Sodium: 143 mmol/L (ref 135–145)
Total Bilirubin: 0.6 mg/dL (ref 0.3–1.2)
Total Protein: 8.3 g/dL — ABNORMAL HIGH (ref 6.5–8.1)

## 2018-02-25 NOTE — Telephone Encounter (Signed)
Gave pt avs and calendar  °

## 2018-02-25 NOTE — Progress Notes (Signed)
[place]  [phone]) [phone] Fax:(336) [phone]  Clinic Follow up Note   Patient Care Team: [name], NP-C as PCP - General (Family Medicine) Milus Banister, MD as Attending Physician (Gastroenterology) Ilean China as Physician Assistant (Cardiology) Prescott Gum, Collier Salina, MD as Consulting Physician (Cardiothoracic Surgery) Greggory Keen, MD as Consulting Physician (Interventional Radiology) 02/25/2018  CHIEF COMPLAIN: f/u [place]   SUMMARY OF ONCOLOGIC HISTORY: Oncology History   Cancer Staging Hepatocellular carcinoma [facility]) Staging form: Liver, AJCC 8th Edition - Clinical stage from 07/25/2017: Stage IIIA (cT3, cN0, cM0) - Signed by Truitt Merle, MD on 08/26/2017       Hepatocellular carcinoma ([place])   07/2017 Tumor Marker    AFP: 2431    07/15/2017 Imaging    IMPRESSION: ULTRASOUND ABDOMEN: Multiple hypoechoic liver masses, suspicious for multifocal hepatoma or hepatic metastatic disease. Abdomen MRI without and with contrast is recommended for further characterization. Cholelithiasis. No sonographic signs of acute cholecystitis or biliary dilatation. These results will be called to the ordering clinician or representative by the Radiologist Assistant, and communication documented in the PACS or zVision Dashboard.  ULTRASOUND HEPATIC ELASTOGRAPHY: Median hepatic shear wave velocity is calculated at 2.52 m/sec. Corresponding Metavir fibrosis score is Some F3 + F4. Risk of fibrosis is High. Follow-up: Follow up advised    07/25/2017 Imaging    IMPRESSION: At least 4 separate hepatic masses with characteristics of multifocal hepatoma, largest measuring 5.5 cm. No evidence of abdominal metastatic disease. Cholelithiasis.  No radiographic evidence of cholecystitis. Incidentally noted congenital small bowel malrotation. No evidence of volvulus or bowel obstruction.     07/25/2017 Cancer Staging    Staging form: Liver, AJCC 8th Edition -  Clinical stage from 07/25/2017: Stage IIIA (cT3, cN0, cM0) - Signed by Truitt Merle, MD on 08/26/2017    08/25/2017 Initial Diagnosis    Hepatocellular carcinoma ([place])    11/06/2017 Procedure    peripheral right hepatic artery Y 90 radio embolization    12/04/2017 Procedure    peripheral left hepatic Y 90 radio embolization    12/21/2017 Imaging    CT Chest W Contrast 12/21/17  IMPRESSION: 1. 4.8 x 5.9 x 6.7 cm heterogeneously enhancing hepatic mass compatible with the reported clinical history of hepatocellular carcinoma. No definite metastatic disease noted in the thorax, although there is a slightly prominent anterior mediastinal lymph node measuring 7 mm in short axis in the juxta pericardiac nodal station. This is nonspecific but warrants attention on follow-up studies. 2. Mild diffuse bronchial wall thickening with mild centrilobular and moderate paraseptal emphysema; imaging findings suggestive of underlying COPD. 3. In addition, there is evidence of probable interstitial lung disease in the lung bases. Outpatient referral to Pulmonology for further evaluation could be considered if clinically appropriate. 4. Aortic atherosclerosis, in addition to left main and 3 vessel coronary artery disease. Status post median sternotomy for CABG including LIMA to the LAD. Aortic Atherosclerosis (ICD10-I70.0) and Emphysema (ICD10-J43.9).    CURRENT THERAPY  Observation   INTERVAL HISTORY:  Zachary Steele is here for a follow up of his liver cancer. He presents to the clinic today with his family member. He notes he tolerated his 2 Y90 treatments.  He notes when he belches he feels his breath is short for 1 second. This happens intermittently doing any activity or sitting. He was given an inhaler by his PCP. He was advised to stop smoking. He notes he now smokes 2 cigarettes a day. He notes having a cough  with phlegm, but no chest pain. He takes medication for this mucus production. He denies  any GI issues. He sees [employer] recently. He notes he has had his flu shot this year.     REVIEW OF SYSTEMS:   Constitutional: Denies fevers, chills or abnormal weight loss Eyes: Denies blurriness of vision Ears, nose, mouth, throat, and face: Denies mucositis or sore throat Respiratory: Denies cough, dyspnea or wheezes (+) intermittent SOB  Cardiovascular: Denies palpitation, chest discomfort or lower extremity swelling Gastrointestinal:  Denies nausea, heartburn or change in bowel habits Skin: Denies abnormal skin rashes Lymphatics: Denies new lymphadenopathy or easy bruising Neurological:Denies numbness, tingling or new weaknesses Behavioral/Psych: Mood is stable, no new changes  All other systems were reviewed with the patient and are negative.  MEDICAL HISTORY:  Past Medical History:  Diagnosis Date  . Alcohol dependence ([place])   . Anemia    denies  . Arthritis   . Cataract   . Cirrhosis ([place])   . Coronary artery disease due to lipid rich plaque   . Depression   . Hepatitis C    Has A and B also  . PAD (peripheral artery disease) ([place])    pt reports he has this and has chronic leg "soreness"  . Peripheral vascular disease ([place])    essentially normal circulations with two-vessel  runoff to the feet bilaterally  . Tobacco abuse     SURGICAL HISTORY: Past Surgical History:  Procedure Laterality Date  . CARDIAC CATHETERIZATION    . COLONOSCOPY    . CORONARY ARTERY BYPASS GRAFT  2010   [facility] bypass [facility]  . DOPPLER ECHOCARDIOGRAPHY  2011  . IR ANGIOGRAM SELECTIVE EACH ADDITIONAL VESSEL  10/27/2017  . IR ANGIOGRAM SELECTIVE EACH ADDITIONAL VESSEL  10/27/2017  . IR ANGIOGRAM SELECTIVE EACH ADDITIONAL VESSEL  10/27/2017  . IR ANGIOGRAM SELECTIVE EACH ADDITIONAL VESSEL  10/27/2017  . IR ANGIOGRAM SELECTIVE EACH ADDITIONAL VESSEL  10/27/2017  . IR ANGIOGRAM SELECTIVE EACH ADDITIONAL VESSEL  10/27/2017  . IR ANGIOGRAM SELECTIVE EACH ADDITIONAL VESSEL  10/27/2017  . IR  ANGIOGRAM SELECTIVE EACH ADDITIONAL VESSEL  11/06/2017  . IR ANGIOGRAM SELECTIVE EACH ADDITIONAL VESSEL  11/06/2017  . IR ANGIOGRAM SELECTIVE EACH ADDITIONAL VESSEL  11/06/2017  . IR ANGIOGRAM SELECTIVE EACH ADDITIONAL VESSEL  12/04/2017  . IR ANGIOGRAM SELECTIVE EACH ADDITIONAL VESSEL  12/04/2017  . IR ANGIOGRAM VISCERAL SELECTIVE  10/27/2017  . IR ANGIOGRAM VISCERAL SELECTIVE  11/06/2017  . IR ANGIOGRAM VISCERAL SELECTIVE  12/04/2017  . IR EMBO ARTERIAL NOT HEMORR HEMANG INC GUIDE ROADMAPPING  10/27/2017  . IR EMBO TUMOR ORGAN ISCHEMIA INFARCT INC GUIDE ROADMAPPING  11/06/2017  . IR EMBO TUMOR ORGAN ISCHEMIA INFARCT INC GUIDE ROADMAPPING  12/04/2017  . IR RADIOLOGIST EVAL & MGMT  08/12/2017  . IR RADIOLOGIST EVAL & MGMT  09/22/2017  . IR RADIOLOGIST EVAL & MGMT  01/07/2018  . IR US GUIDE VASC ACCESS RIGHT  10/27/2017  . IR US GUIDE VASC ACCESS RIGHT  11/06/2017  . IR US GUIDE VASC ACCESS RIGHT  12/04/2017  . NM MYOVIEW LTD  2012    I have reviewed the social history and family history with the patient and they are unchanged from previous note.  ALLERGIES:  is allergic to statins.  MEDICATIONS:  Current Outpatient Medications  Medication Sig Dispense Refill  . acetaminophen (TYLENOL) 500 MG tablet Take 1,000 mg by mouth every 6 (six) hours as needed.    Marland Kitchen albuterol (PROVENTIL HFA;VENTOLIN HFA) 108 (90  Base) MCG/ACT inhaler Inhale 2 puffs into the lungs every 6 (six) hours as needed for wheezing or shortness of breath. 1 Inhaler 6  . aspirin 81 MG tablet Take 81 mg by mouth daily.    . Cholecalciferol (VITAMIN D3 PO) Take 125 mcg by mouth daily.    . Liver Extract (LIVER PO) Take by mouth.    . Multiple Vitamin (MULTIVITAMIN WITH MINERALS) TABS tablet Take 1 tablet by mouth daily.    . pantoprazole (PROTONIX) 40 MG tablet 40 mg one tab 30 minutes before breakfast meal 90 tablet 3   No current facility-administered medications for this visit.     PHYSICAL EXAMINATION: ECOG PERFORMANCE STATUS: 1 -  Symptomatic but completely ambulatory  Vitals:   02/25/18 1104  BP: 124/75  Pulse: 69  Resp: 18  Temp: 97.6 F (36.4 C)  SpO2: 97%   Filed Weights   02/25/18 1104  Weight: 131 lb 14.4 oz (59.8 kg)    GENERAL:alert, no distress and comfortable SKIN: skin color, texture, turgor are normal, no rashes or significant lesions EYES: normal, Conjunctiva are pink and non-injected, sclera clear OROPHARYNX:no exudate, no erythema and lips, buccal mucosa, and tongue normal  NECK: supple, thyroid normal size, non-tender, without nodularity LYMPH:  no palpable lymphadenopathy in the cervical, axillary or inguinal LUNGS: clear to auscultation and percussion with normal breathing effort HEART: regular rate & rhythm and no murmurs and no lower extremity edema ABDOMEN:abdomen soft, non-tender and normal bowel sounds Musculoskeletal:no cyanosis of digits and no clubbing  NEURO: alert & oriented x 3 with fluent speech, no focal motor/sensory deficits  LABORATORY DATA:  I have reviewed the data as listed CBC Latest Ref Rng & Units 02/25/2018 12/04/2017 11/06/2017  WBC 4.0 - 10.5 K/uL 4.1 4.5 4.6  Hemoglobin 13.0 - 17.0 g/dL 12.6(L) 12.3(L) 12.8(L)  Hematocrit 39.0 - 52.0 % 38.0(L) 35.8(L) 36.9(L)  Platelets 150 - 400 K/uL 120(L) 130(L) 137(L)     CMP Latest Ref Rng & Units 02/25/2018 12/04/2017 11/06/2017  Glucose 70 - 99 mg/dL 116(H) 82 90  BUN 8 - 23 mg/dL 6(L) 7(L) 7(L)  Creatinine 0.61 - 1.24 mg/dL 0.88 0.70 0.69  Sodium 135 - 145 mmol/L 143 141 140  Potassium 3.5 - 5.1 mmol/L 4.0 3.7 3.6  Chloride 98 - 111 mmol/L 106 106 106  CO2 22 - 32 mmol/L '28 26 27  '$ Calcium 8.9 - 10.3 mg/dL 9.6 9.2 9.5  Total Protein 6.5 - 8.1 g/dL 8.3(H) 7.5 7.7  Total Bilirubin 0.3 - 1.2 mg/dL 0.6 1.0 0.8  Alkaline Phos 38 - 126 U/L 128(H) 85 85  AST 15 - 41 U/L 36 29 36  ALT 0 - 44 U/L '18 15 22      '$ RADIOGRAPHIC STUDIES: I have personally reviewed the radiological images as listed and agreed with the  findings in the report. No results found.   CT Chest W Contrast 12/21/17  IMPRESSION: 1. 4.8 x 5.9 x 6.7 cm heterogeneously enhancing hepatic mass compatible with the reported clinical history of hepatocellular carcinoma. No definite metastatic disease noted in the thorax, although there is a slightly prominent anterior mediastinal lymph node measuring 7 mm in short axis in the juxta pericardiac nodal station. This is nonspecific but warrants attention on follow-up studies. 2. Mild diffuse bronchial wall thickening with mild centrilobular and moderate paraseptal emphysema; imaging findings suggestive of underlying COPD. 3. In addition, there is evidence of probable interstitial lung disease in the lung bases. Outpatient referral to Pulmonology for further  evaluation could be considered if clinically appropriate. 4. Aortic atherosclerosis, in addition to left main and 3 vessel coronary artery disease. Status post median sternotomy for CABG including LIMA to the LAD. Aortic Atherosclerosis (ICD10-I70.0) and Emphysema (ICD10-J43.9).   ASSESSMENT & PLAN:   Zachary Steele is a pleasant 67 year old AAF male with PMH significant for long term alcohol use, chronic Hep C, and liver cirrhosis presents for evaluation of multiple liver masses   1. Hepatocellular Carcinoma, multifocal, cT3N0Mx -We previously reviewed his medical record, labs, and imaging in detail with the patient and spouse. He has at least 4 separate hepatic masses with the largest measuring 5.5 cm with typical imaging features of HCC. AFP is elevated to 2431; this is diagnostic for hepatocellular carcinoma.   -Due to the size and multifocal disease he is not a candidate for liver transplant or resection. His liver cancer is not likely curable at this stage.  -He previously met with IR Dr. Annamaria Boots who reviewed embolization options and recommends palliative Y90 radioembolization. Due to the size he is not a candidate for ablation.    -He underwent bilateral hepatic artery Y90 treatments on 11/06/17 and 12/04/17 with Dr. Annamaria Boots. He tolerated the procedure very well.  He will follow-up with Dr. Annamaria Boots next month. -We discussed that his liver cancer is likely incurable. If he has disease progression, he will likely require systemic therapy, such as TKIs (sorafenib, lenvatinib, etc), immunotherapy, etc -will continue with observation for now. Will watch for concerning symptoms of disease progression  -he will have restaging scan with Dr. Annamaria Boots  -He has had a flu shot for this year.  -He is clinically doing well, I will f/u with him in 6 months.    2. 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  -He has thrombocytopenia PLT 90K related to his liver disease but liver function remains normal -Due to concern for portal hypertension he has been referred to GI for endoscopy for variceal screening, appointment 09/22/17.  -No sign of ascites; will monitor  -We recommend he continue to abstain from alcohol to prevent further liver damage and try to quit smoking   3. Chronic Hepatitis C -Per Dawn, NP will defer hep C treatment until after [facility] treatment -She recommends Hep A and Hep B vaccination series   4. COPD, Smoking Cessation -He has some intermittent SOB, likely related to his smoking.  -His PCP started him on inhaler.  -He has reduced down to 2 cigarettes a day, I encouraged him to complete cessation.   PLAN: -He is clinically doing well, will continue observation -he will f/u with Dr. Annamaria Boots with restaging scan to evaluate his response to Y90  -Lab and f/u in 6 months    No problem-specific Assessment & Plan notes found for this encounter.   No orders of the defined types were placed in this encounter.  All questions were answered. The patient knows to call the clinic with any problems, questions or concerns. No barriers to learning was detected. I spent 20 minutes counseling  the patient face to face. The total time spent in the appointment was 25 minutes and more than 50% was on counseling and review of test results     Truitt Merle, MD 02/25/2018   I, Joslyn Devon, am acting as scribe for Truitt Merle, MD.   I have reviewed the above documentation for accuracy and completeness, and I agree with the above.

## 2018-02-26 LAB — AFP TUMOR MARKER: AFP, Serum, Tumor Marker: 60 ng/mL — ABNORMAL HIGH (ref 0.0–8.3)

## 2018-03-04 ENCOUNTER — Other Ambulatory Visit: Payer: Self-pay | Admitting: Radiology

## 2018-03-04 ENCOUNTER — Other Ambulatory Visit (HOSPITAL_COMMUNITY): Payer: Self-pay | Admitting: Interventional Radiology

## 2018-03-04 DIAGNOSIS — C220 Liver cell carcinoma: Secondary | ICD-10-CM

## 2018-03-05 ENCOUNTER — Encounter: Payer: Self-pay | Admitting: Family Medicine

## 2018-03-05 ENCOUNTER — Ambulatory Visit (INDEPENDENT_AMBULATORY_CARE_PROVIDER_SITE_OTHER): Payer: Medicare HMO | Admitting: Family Medicine

## 2018-03-05 VITALS — BP 126/68 | HR 68 | Temp 97.9°F | Resp 16 | Wt 130.6 lb

## 2018-03-05 DIAGNOSIS — Z202 Contact with and (suspected) exposure to infections with a predominantly sexual mode of transmission: Secondary | ICD-10-CM | POA: Diagnosis not present

## 2018-03-05 MED ORDER — METRONIDAZOLE 500 MG PO TABS: 500.0000 mg | ORAL_TABLET | Freq: Two times a day (BID) | ORAL | 0 refills | Status: DC

## 2018-03-05 NOTE — Progress Notes (Signed)
   Subjective:    Patient ID: Zachary Steele, male    DOB: 1950/05/23, 67 y.o.   MRN: [id number]  HPI Chief Complaint  Patient presents with  . consult    consult- discuss sex partner, wife said she had a virus and he needed ot set up an appt   He is here to discuss his sexual partner having trichomonas.  Denies having any symptoms. Would like to be treated.   No fever, chills, abdominal pain, N/V/D, urinary symptoms. No penile drainage.   Review of Systems Pertinent positives and negatives in the history of present illness.     Objective:   Physical Exam BP 126/68   Pulse 68   Temp 97.9 F (36.6 C) (Oral)   Resp 16   Wt 130 lb 9.6 oz (59.2 kg)   SpO2 98%   BMI 20.45 kg/m   Alert and oriented and in no acute distress. Not otherwise examined.       Assessment & Plan:  Exposure to STD - Plan: RPR, HIV Antibody (routine testing w rflx), GC/Chlamydia Probe Amp, Trichomonas vaginalis, RNA, metroNIDAZOLE (FLAGYL) 500 MG tablet  Will test for STDs including HIV, RPR and initiate treatment for trichomonas since wife was positive.  Follow up pending labs.

## 2018-03-06 LAB — HIV ANTIBODY (ROUTINE TESTING W REFLEX): HIV Screen 4th Generation wRfx: NONREACTIVE

## 2018-03-06 LAB — RPR: RPR Ser Ql: NONREACTIVE

## 2018-03-09 ENCOUNTER — Encounter: Payer: Self-pay | Admitting: Radiology

## 2018-03-09 LAB — GC/CHLAMYDIA PROBE AMP
Chlamydia trachomatis, NAA: NEGATIVE
Neisseria gonorrhoeae by PCR: NEGATIVE

## 2018-03-09 LAB — TRICHOMONAS VAGINALIS, PROBE AMP: Trich vag by NAA: NEGATIVE

## 2018-03-19 ENCOUNTER — Ambulatory Visit (HOSPITAL_COMMUNITY): Admission: RE | Admit: 2018-03-19 | Payer: Medicare HMO | Source: Ambulatory Visit

## 2018-03-19 ENCOUNTER — Encounter (HOSPITAL_COMMUNITY): Payer: Self-pay

## 2018-03-26 ENCOUNTER — Ambulatory Visit: Payer: Self-pay | Admitting: Primary Care

## 2018-03-31 ENCOUNTER — Other Ambulatory Visit: Payer: Self-pay | Admitting: Nurse Practitioner

## 2018-03-31 ENCOUNTER — Encounter: Payer: Self-pay | Admitting: Radiology

## 2018-03-31 ENCOUNTER — Other Ambulatory Visit (HOSPITAL_COMMUNITY): Payer: Self-pay | Admitting: Interventional Radiology

## 2018-03-31 DIAGNOSIS — C220 Liver cell carcinoma: Secondary | ICD-10-CM

## 2018-04-08 ENCOUNTER — Other Ambulatory Visit: Payer: Self-pay

## 2018-04-08 ENCOUNTER — Ambulatory Visit (INDEPENDENT_AMBULATORY_CARE_PROVIDER_SITE_OTHER): Payer: Medicare HMO | Admitting: Primary Care

## 2018-04-08 ENCOUNTER — Ambulatory Visit (INDEPENDENT_AMBULATORY_CARE_PROVIDER_SITE_OTHER): Payer: Medicare HMO | Admitting: Pulmonary Disease

## 2018-04-08 ENCOUNTER — Encounter: Payer: Self-pay | Admitting: Primary Care

## 2018-04-08 ENCOUNTER — Telehealth: Payer: Self-pay | Admitting: Primary Care

## 2018-04-08 DIAGNOSIS — J849 Interstitial pulmonary disease, unspecified: Secondary | ICD-10-CM

## 2018-04-08 DIAGNOSIS — J439 Emphysema, unspecified: Secondary | ICD-10-CM | POA: Diagnosis not present

## 2018-04-08 LAB — PULMONARY FUNCTION TEST
DL/VA % pred: 68 %
DL/VA: 3.08 ml/min/mmHg/L
DLCO cor % pred: 48 %
DLCO cor: 14.42 ml/min/mmHg
DLCO unc % pred: 45 %
DLCO unc: 13.54 ml/min/mmHg
FEF 25-75 Post: 1.42 L/sec
FEF 25-75 Pre: 1.32 L/sec
FEF2575-%Change-Post: 8 %
FEF2575-%Pred-Post: 58 %
FEF2575-%Pred-Pre: 54 %
FEV1-%Change-Post: 2 %
FEV1-%Pred-Post: 84 %
FEV1-%Pred-Pre: 82 %
FEV1-Post: 2.31 L
FEV1-Pre: 2.25 L
FEV1FVC-%Change-Post: 0 %
FEV1FVC-%Pred-Pre: 89 %
FEV6-%Change-Post: 2 %
FEV6-%Pred-Post: 95 %
FEV6-%Pred-Pre: 93 %
FEV6-Post: 3.29 L
FEV6-Pre: 3.2 L
FEV6FVC-%Change-Post: 0 %
FEV6FVC-%Pred-Post: 102 %
FEV6FVC-%Pred-Pre: 102 %
FVC-%Change-Post: 1 %
FVC-%Pred-Post: 93 %
FVC-%Pred-Pre: 91 %
FVC-Post: 3.35 L
FVC-Pre: 3.29 L
Post FEV1/FVC ratio: 69 %
Post FEV6/FVC ratio: 98 %
Pre FEV1/FVC ratio: 69 %
Pre FEV6/FVC Ratio: 98 %
RV % pred: 103 %
RV: 2.35 L
TLC % pred: 84 %
TLC: 5.58 L

## 2018-04-08 MED ORDER — TIOTROPIUM BROMIDE MONOHYDRATE 1.25 MCG/ACT IN AERS: 2.0000 | INHALATION_SPRAY | Freq: Every day | RESPIRATORY_TRACT | 2 refills | Status: DC

## 2018-04-08 NOTE — Progress Notes (Signed)
@Patient  ID: Zachary Steele, male    DOB: [birth year], 67 y.o.   MRN: [id number]  Chief Complaint  Patient presents with  . Follow-up    Full PFT's; walking fast notices lightheadedness for a few seconds and goes away with belch    Referring provider: [name], NP-C  HPI: 67 year old male, current some day smoker. PMH significant ETOH, hep C, cirrhosis, hepatocellular carcinoma, emphysema. Patient of Dr. Halford Chessman, seen for initial consult on 02/15/18. Quit drinking alcohol in Jan 2019. Complains of intermittent cough with clear sputum, dyspnea when walking up hills. Works with [employer]. No on any inhaler medication currently. Plan to check A1AT level and PFTs, Given RX for proair as needed. Encouraged smoking cessation. Received influenza vaccine on 02/25/18.   04/08/2018 Patient presents today for 6 week follow-up with PFTs.  Patient states that he still gets short of breath with walking. He has been carrying his rescue inhaler with him and uses it once and a while. Has not noticed much difference with SABA.  Continues to have a dry hacking cough.  He is smoking no more than 2 cigarettes a day. Describes some intermittent lightheadedness and dizziness with activity that lasts less than a minute.  Reports it is similar feeling as a tobacco high. He states that belching makes this go away. Following with [facility] for liver cirrhosis and hep c.  Orthostatic blood pressure check today negative. Ambulatory O2 sat with no desaturation or reproducible lightheadedness.  Denies chest pain, wheezing, productive cough, hemoptysis.  Pulmonary function test: 04/08/2018 - FVC 3.35 (93%), FEV1 2.31 (84%), ratio 69, DLCOcor 45%, no BD response. Curvature to flow volume loop   Imaging: 12/21/17 CT Chest w/ contrast - Diffuse bronchial wall thickening with mild centrilobular and moderate paraseptal emphysema most notably in the lung apices. Areas of ground-glass attenuation, septal thickening,  subpleural reticulation, mild cylindrical bronchiectasis and peripheral bronchiolectasis are noted in the lung bases bilaterally, most severe in the left lower lobe. Calcified granuloma in the inferior segment of the lingula.  Labs: Alpha-1 antitrypsin- 201 (H) Rheumatoid factor- 145 (H) ANCA- positive  ANA- pending   Allergies  Allergen Reactions  . Statins Other (See Comments)    Alcoholic cirrhosis, cardiology has declined to start statin    Immunization History  Administered Date(s) Administered  . Influenza, High Dose Seasonal PF 02/15/2018  . Pneumococcal Polysaccharide-23 09/19/2011    Past Medical History:  Diagnosis Date  . Alcohol dependence ([place])   . Anemia    denies  . Arthritis   . Cataract   . Cirrhosis ([place])   . Coronary artery disease due to lipid rich plaque   . Depression   . Hepatitis C    Has A and B also  . PAD (peripheral artery disease) ([place])    pt reports he has this and has chronic leg "soreness"  . Peripheral vascular disease ([place])    essentially normal circulations with two-vessel  runoff to the feet bilaterally  . Tobacco abuse     Tobacco History: Social History   Tobacco Use  Smoking Status Current Some Day Smoker  . Packs/day: 0.10  . Years: 52.00  . Pack years: 5.20  . Types: Cigarettes  Smokeless Tobacco Never Used  Tobacco Comment   Is trying to stop smoking, still has one or two per day most days   Ready to quit: Not Answered Counseling given: Not Answered Comment: Is trying to stop smoking, still has one or  two per day most days   Outpatient Medications Prior to Visit  Medication Sig Dispense Refill  . acetaminophen (TYLENOL) 500 MG tablet Take 1,000 mg by mouth every 6 (six) hours as needed.    Marland Kitchen albuterol (PROVENTIL HFA;VENTOLIN HFA) 108 (90 Base) MCG/ACT inhaler Inhale 2 puffs into the lungs every 6 (six) hours as needed for wheezing or shortness of breath. 1 Inhaler 6  . aspirin 81 MG tablet Take 81 mg by mouth  daily.    . Cholecalciferol (VITAMIN D3 PO) Take 125 mcg by mouth daily.    . metroNIDAZOLE (FLAGYL) 500 MG tablet Take 1 tablet (500 mg total) by mouth 2 (two) times daily. 14 tablet 0  . Multiple Vitamin (MULTIVITAMIN WITH MINERALS) TABS tablet Take 1 tablet by mouth daily.    . pantoprazole (PROTONIX) 40 MG tablet 40 mg one tab 30 minutes before breakfast meal 90 tablet 3   No facility-administered medications prior to visit.     Review of Systems  Review of Systems  Constitutional: Negative.   Respiratory: Positive for cough and shortness of breath. Negative for apnea, choking, chest tightness, wheezing and stridor.   Cardiovascular: Negative.   Gastrointestinal:       Belching   Neurological: Positive for dizziness and light-headedness. Negative for seizures, syncope, facial asymmetry, speech difficulty, weakness, numbness and headaches.  Psychiatric/Behavioral: Negative.     Physical Exam  BP 126/68 (BP Location: Left Arm, Cuff Size: Normal)   Pulse (!) 58   Temp 98.4 F (36.9 C)   Ht 5\' 8"  (1.727 m)   Wt 133 lb 12.8 oz (60.7 kg)   SpO2 98%   BMI 20.34 kg/m  Physical Exam Constitutional:      General: He is not in acute distress.    Appearance: Normal appearance.     Comments: Thin adult male  HENT:     Head: Normocephalic and atraumatic.     Nose: Nose normal.     Mouth/Throat:     Mouth: Mucous membranes are moist.     Pharynx: Oropharynx is clear.  Eyes:     Extraocular Movements: Extraocular movements intact.     Pupils: Pupils are equal, round, and reactive to light.  Neck:     Musculoskeletal: Normal range of motion and neck supple.  Cardiovascular:     Rate and Rhythm: Normal rate and regular rhythm.  Pulmonary:     Effort: Pulmonary effort is normal.     Breath sounds: No wheezing or rhonchi.     Comments: Fine crackles to bases. No resp distress or wheeze.  Skin:    General: Skin is warm and dry.  Neurological:     General: No focal deficit  present.     Mental Status: He is alert and oriented to person, place, and time. Mental status is at baseline.  Psychiatric:        Mood and Affect: Mood normal.        Behavior: Behavior normal.        Thought Content: Thought content normal.        Judgment: Judgment normal.      Lab Results:  CBC    Component Value Date/Time   WBC 4.1 02/25/2018 1023   WBC 4.5 12/04/2017 0800   RBC 4.02 (L) 02/25/2018 1023   HGB 12.6 (L) 02/25/2018 1023   HGB 12.9 (L) 05/19/2017 1010   HGB 12.9 (L) 11/19/2009 1342   HCT 38.0 (L) 02/25/2018 1023   HCT 37.5 05/19/2017  1010   HCT 36.6 (L) 11/19/2009 1342   PLT 120 (L) 02/25/2018 1023   PLT 90 (LL) 05/19/2017 1010   MCV 94.5 02/25/2018 1023   MCV 104 (H) 05/19/2017 1010   MCV 111.5 (H) 11/19/2009 1342   MCH 31.3 02/25/2018 1023   MCHC 33.2 02/25/2018 1023   RDW 13.8 02/25/2018 1023   RDW 13.5 05/19/2017 1010   RDW 13.1 11/19/2009 1342   LYMPHSABS 0.9 02/25/2018 1023   LYMPHSABS 1.7 05/19/2017 1010   LYMPHSABS 1.6 11/19/2009 1342   MONOABS 0.5 02/25/2018 1023   MONOABS 0.6 11/19/2009 1342   EOSABS 0.3 02/25/2018 1023   EOSABS 0.3 05/19/2017 1010   BASOSABS 0.0 02/25/2018 1023   BASOSABS 0.1 05/19/2017 1010   BASOSABS 0.0 11/19/2009 1342    BMET    Component Value Date/Time   NA 143 02/25/2018 1023   NA 140 05/19/2017 1010   K 4.0 02/25/2018 1023   CL 106 02/25/2018 1023   CO2 28 02/25/2018 1023   GLUCOSE 116 (H) 02/25/2018 1023   BUN 6 (L) 02/25/2018 1023   BUN 5 (L) 05/19/2017 1010   CREATININE 0.88 02/25/2018 1023   CALCIUM 9.6 02/25/2018 1023   GFRNONAA >60 02/25/2018 1023   GFRAA >60 02/25/2018 1023    BNP No results found for: BNP  ProBNP    Component Value Date/Time   PROBNP 78.0 04/28/2008 1637    Imaging: No results found.   Assessment & Plan:  67 year old male, current every day smoker.  Past medical history significant for liver cirrhosis, hepatocellular carcinoma, hepatitis C, new ILD and  emphysema.  Patient reports shortness of breath with ambulating short to medium distances.  Associated hacking cough and lightheadedness with activity.  Orthostatic blood pressure check today was negative.  Ambulatory O2 sats showed no oxygen desaturation or reproducible lightheadedness. PFTs today showed mild obstructive lung disease.  FEV1 2.31 (84% ), ratio of 69, DLCO correlated 48% . No bronchodilator response.  Alpha-1 antitrypsin levels elevated. RF and ANCA positive.  We will start Spiriva 2 puffs once daily.  Continue rescue inhaler as needed for shortness of breath or wheezing.  Recommend Delsym cough syrup every 12 hours as needed for cough.  Strongly encourage smoking cessation.  Follow-up in 4 to 6 weeks.  Chronic obstructive pulmonary disease (HCC) - PFTs today showed mild obstructive lung disease (GOLD 1) - Starting Spiriva Respimat 1.25 mcg, two puffs once daily (samples given) - Continue as needed proair rescue inahler 2 puffs every 4-6 hours for shortness of breath or wheezing - Continue to encourage smoking cessation - Follow-up in 4 to 6 weeks  ILD (interstitial lung disease) ([place]) - CT chest in September showed evidence of probable interstitial lung disease in the lung bases - PFTs showed decreased DLCOcor 45%  - Rheumatoid factor and ANCA positive - Continue to encourage smoking cessation  - Refer to rheumatology   Martyn Ehrich, NP 04/08/2018

## 2018-04-08 NOTE — Telephone Encounter (Signed)
In and done.

## 2018-04-08 NOTE — Progress Notes (Signed)
Reviewed and agree with assessment/plan.   Jaylen Claude, MD [facility] 04/09/2016, 12:24 PM Pager:  [phone]  

## 2018-04-08 NOTE — Assessment & Plan Note (Addendum)
-   PFTs today showed mild obstructive lung disease (GOLD 1) - Starting Spiriva Respimat 1.25 mcg, two puffs once daily (samples given) - Continue as needed proair rescue inahler 2 puffs every 4-6 hours for shortness of breath or wheezing - Continue to encourage smoking cessation - Follow-up in 4 to 6 weeks

## 2018-04-08 NOTE — Assessment & Plan Note (Addendum)
-   CT chest in September showed evidence of probable interstitial lung disease in the lung bases - PFTs showed decreased DLCOcor 45%  - Rheumatoid factor and ANCA positive - Continue to encourage smoking cessation  - Refer to rheumatology

## 2018-04-08 NOTE — Telephone Encounter (Signed)
Please refer to rheumatology re: abnormal labs and evidence of ILD on most recent Chest CT

## 2018-04-08 NOTE — Patient Instructions (Addendum)
Pulmonary function test showed mild obstructive lung disease  Plan start daily maintenance inhaler called Spiriva - take 2 puffs once daily   Use proair rescue inhaler 2 puffs every 4-6 hours as needed for shortness of breath or wheezing  Avoid menthol products - this can trigger cough  Delsym cough syrup 5-49ml every 12 hours as needed for cough   Continue to work on smoking cessation  Follow up with Dr. Halford Chessman or NP in 4-6 weeks    Chronic Obstructive Pulmonary Disease Chronic obstructive pulmonary disease (COPD) is a long-term (chronic) lung problem. When you have COPD, it is hard for air to get in and out of your lungs. Usually the condition gets worse over time, and your lungs will never return to normal. There are things you can do to keep yourself as healthy as possible.  Your doctor may treat your condition with: ? Medicines. ? Oxygen. ? Lung surgery.  Your doctor may also recommend: ? Rehabilitation. This includes steps to make your body work better. It may involve a team of specialists. ? Quitting smoking, if you smoke. ? Exercise and changes to your diet. ? Comfort measures (palliative care). Follow these instructions at home: Medicines  Take over-the-counter and prescription medicines only as told by your doctor.  Talk to your doctor before taking any cough or allergy medicines. You may need to avoid medicines that cause your lungs to be dry. Lifestyle  If you smoke, stop. Smoking makes the problem worse. If you need help quitting, ask your doctor.  Avoid being around things that make your breathing worse. This may include smoke, chemicals, and fumes.  Stay active, but remember to rest as well.  Learn and use tips on how to relax.  Make sure you get enough sleep. Most adults need at least 7 hours of sleep every night.  Eat healthy foods. Eat smaller meals more often. Rest before meals. Controlled breathing Learn and use tips on how to control your breathing  as told by your doctor. Try:  Breathing in (inhaling) through your nose for 1 second. Then, pucker your lips and breath out (exhale) through your lips for 2 seconds.  Putting one hand on your belly (abdomen). Breathe in slowly through your nose for 1 second. Your hand on your belly should move out. Pucker your lips and breathe out slowly through your lips. Your hand on your belly should move in as you breathe out.  Controlled coughing Learn and use controlled coughing to clear mucus from your lungs. Follow these steps: 1. Lean your head a little forward. 2. Breathe in deeply. 3. Try to hold your breath for 3 seconds. 4. Keep your mouth slightly open while coughing 2 times. 5. Spit any mucus out into a tissue. 6. Rest and do the steps again 1 or 2 times as needed. General instructions  Make sure you get all the shots (vaccines) that your doctor recommends. Ask your doctor about a flu shot and a pneumonia shot.  Use oxygen therapy and pulmonary rehabilitation if told by your doctor. If you need home oxygen therapy, ask your doctor if you should buy a tool to measure your oxygen level (oximeter).  Make a COPD action plan with your doctor. This helps you to know what to do if you feel worse than usual.  Manage any other conditions you have as told by your doctor.  Avoid going outside when it is very hot, cold, or humid.  Avoid people who have a sickness you  can catch (contagious).  Keep all follow-up visits as told by your doctor. This is important. Contact a doctor if:  You cough up more mucus than usual.  There is a change in the color or thickness of the mucus.  It is harder to breathe than usual.  Your breathing is faster than usual.  You have trouble sleeping.  You need to use your medicines more often than usual.  You have trouble doing your normal activities such as getting dressed or walking around the house. Get help right away if:  You have shortness of breath  while resting.  You have shortness of breath that stops you from: ? Being able to talk. ? Doing normal activities.  Your chest hurts for longer than 5 minutes.  Your skin color is more blue than usual.  Your pulse oximeter shows that you have low oxygen for longer than 5 minutes.  You have a fever.  You feel too tired to breathe normally. Summary  Chronic obstructive pulmonary disease (COPD) is a long-term lung problem.  The way your lungs work will never return to normal. Usually the condition gets worse over time. There are things you can do to keep yourself as healthy as possible.  Take over-the-counter and prescription medicines only as told by your doctor.  If you smoke, stop. Smoking makes the problem worse. This information is not intended to replace advice given to you by your health care provider. Make sure you discuss any questions you have with your health care provider. Document Released: 09/17/2007 Document Revised: 05/05/2016 Document Reviewed: 05/05/2016 Elsevier Interactive Patient Education  2019 [employer].  Steps to Quit Smoking  Smoking tobacco can be harmful to your health and can affect almost every organ in your body. Smoking puts you, and those around you, at risk for developing many serious chronic diseases. Quitting smoking is difficult, but it is one of the best things that you can do for your health. It is never too late to quit. What are the benefits of quitting smoking? When you quit smoking, you lower your risk of developing serious diseases and conditions, such as:  Lung cancer or lung disease, such as COPD.  Heart disease.  Stroke.  Heart attack.  Infertility.  Osteoporosis and bone fractures. Additionally, symptoms such as coughing, wheezing, and shortness of breath may get better when you quit. You may also find that you get sick less often because your body is stronger at fighting off colds and infections. If you are pregnant,  quitting smoking can help to reduce your chances of having a baby of low birth weight. How do I get ready to quit? When you decide to quit smoking, create a plan to make sure that you are successful. Before you quit:  Pick a date to quit. Set a date within the next two weeks to give you time to prepare.  Write down the reasons why you are quitting. Keep this list in places where you will see it often, such as on your bathroom mirror or in your car or wallet.  Identify the people, places, things, and activities that make you want to smoke (triggers) and avoid them. Make sure to take these actions: ? Throw away all cigarettes at home, at work, and in your car. ? Throw away smoking accessories, such as Scientist, research (medical). ? Clean your car and make sure to empty the ashtray. ? Clean your home, including curtains and carpets.  Tell your family, friends, and coworkers that  you are quitting. Support from your loved ones can make quitting easier.  Talk with your health care provider about your options for quitting smoking.  Find out what treatment options are covered by your health insurance. What strategies can I use to quit smoking? Talk with your healthcare provider about different strategies to quit smoking. Some strategies include:  Quitting smoking altogether instead of gradually lessening how much you smoke over a period of time. Research shows that quitting "cold [place]" is more successful than gradually quitting.  Attending in-person counseling to help you build problem-solving skills. You are more likely to have success in quitting if you attend several counseling sessions. Even short sessions of 10 minutes can be effective.  Finding resources and support systems that can help you to quit smoking and remain smoke-free after you quit. These resources are most helpful when you use them often. They can include: ? Online chats with a Social worker. ? Telephone quitlines. ? Clinical research associate. ? Support groups or group counseling. ? Text messaging programs. ? Mobile phone applications.  Taking medicines to help you quit smoking. (If you are pregnant or breastfeeding, talk with your health care provider first.) Some medicines contain nicotine and some do not. Both types of medicines help with cravings, but the medicines that include nicotine help to relieve withdrawal symptoms. Your health care provider may recommend: ? Nicotine patches, gum, or lozenges. ? Nicotine inhalers or sprays. ? Non-nicotine medicine that is taken by mouth. Talk with your health care provider about combining strategies, such as taking medicines while you are also receiving in-person counseling. Using these two strategies together makes you more likely to succeed in quitting than if you used either strategy on its own. If you are pregnant or breastfeeding, talk with your health care provider about finding counseling or other support strategies to quit smoking. Do not take medicine to help you quit smoking unless told to do so by your health care provider. What things can I do to make it easier to quit? Quitting smoking might feel overwhelming at first, but there is a lot that you can do to make it easier. Take these important actions:  Reach out to your family and friends and ask that they support and encourage you during this time. Call telephone quitlines, reach out to support groups, or work with a counselor for support.  Ask people who smoke to avoid smoking around you.  Avoid places that trigger you to smoke, such as bars, parties, or smoke-break areas at work.  Spend time around people who do not smoke.  Lessen stress in your life, because stress can be a smoking trigger for some people. To lessen stress, try: ? Exercising regularly. ? Deep-breathing exercises. ? Yoga. ? Meditating. ? Performing a body scan. This involves closing your eyes, scanning your body from head to toe, and  noticing which parts of your body are particularly tense. Purposefully relax the muscles in those areas.  Download or purchase mobile phone or tablet apps (applications) that can help you stick to your quit plan by providing reminders, tips, and encouragement. There are many free apps, such as QuitGuide from the [employer] Office manager for Disease Control and Prevention). You can find other support for quitting smoking (smoking cessation) through smokefree.gov and other websites. How will I feel when I quit smoking? Within the first 24 hours of quitting smoking, you may start to feel some withdrawal symptoms. These symptoms are usually most noticeable 2-3 days after quitting, but they  usually do not last beyond 2-3 weeks. Changes or symptoms that you might experience include:  Mood swings.  Restlessness, anxiety, or irritation.  Difficulty concentrating.  Dizziness.  Strong cravings for sugary foods in addition to nicotine.  Mild weight gain.  Constipation.  Nausea.  Coughing or a sore throat.  Changes in how your medicines work in your body.  A depressed mood.  Difficulty sleeping (insomnia). After the first 2-3 weeks of quitting, you may start to notice more positive results, such as:  Improved sense of smell and taste.  Decreased coughing and sore throat.  Slower heart rate.  Lower blood pressure.  Clearer skin.  The ability to breathe more easily.  Fewer sick days. Quitting smoking is very challenging for most people. Do not get discouraged if you are not successful the first time. Some people need to make many attempts to quit before they achieve long-term success. Do your best to stick to your quit plan, and talk with your health care provider if you have any questions or concerns. This information is not intended to replace advice given to you by your health care provider. Make sure you discuss any questions you have with your health care provider. Document Released:  03/25/2001 Document Revised: 11/04/2016 Document Reviewed: 08/15/2014 Elsevier Interactive Patient Education  2019 [employer].

## 2018-04-08 NOTE — Progress Notes (Signed)
PFT done today. 

## 2018-04-15 NOTE — Telephone Encounter (Signed)
Faxed results to [phone] to [facility]

## 2018-04-15 NOTE — Telephone Encounter (Signed)
Routing message to [place] per triage protocol.

## 2018-04-15 NOTE — Telephone Encounter (Signed)
Zachary Steele [facility], [place] [phone] ext [phone].  She states received referral but did not receive rheumatoid factor and the ANA results.  Fax # [phone].

## 2018-04-20 ENCOUNTER — Ambulatory Visit
Admission: RE | Admit: 2018-04-20 | Discharge: 2018-04-20 | Disposition: A | Discharge status: Home / Self Care | Payer: Medicare HMO | Source: Ambulatory Visit | Attending: Nurse Practitioner | Admitting: Nurse Practitioner

## 2018-04-20 ENCOUNTER — Ambulatory Visit
Admission: RE | Admit: 2018-04-20 | Discharge: 2018-04-20 | Disposition: A | Discharge status: Home / Self Care | Payer: Medicare HMO | Source: Ambulatory Visit | Attending: Interventional Radiology | Admitting: Interventional Radiology

## 2018-04-20 ENCOUNTER — Encounter: Payer: Self-pay | Admitting: Radiology

## 2018-04-20 DIAGNOSIS — C220 Liver cell carcinoma: Secondary | ICD-10-CM

## 2018-04-20 HISTORY — PX: IR RADIOLOGIST EVAL & MGMT: IMG5224

## 2018-04-20 MED ORDER — GADOXETATE DISODIUM 0.25 MMOL/ML IV SOLN
6.0000 mL | Freq: Once | INTRAVENOUS | Status: AC | PRN
  Administered 2018-04-20: 6 mL via INTRAVENOUS

## 2018-04-20 NOTE — Progress Notes (Signed)
Patient ID: Zachary Steele, male   DOB: 1950-05-13, 68 y.o.   MRN: [id number]       Chief Complaint: 46-month status post Y 90 radial embolization for hepatocellular carcinoma   Referring Physician(s): Drazek, Burr Medico  History of Present Illness: Zachary Steele is a 68 y.o. male with chronic hepatitis C, alcoholism, cirrhosis and multifocal hepatocellular carcinoma.  He is now status post right and left hepatic Y 90 embolizations.  Most recently he underwent left hepatic embolization 12/04/2017.  He returns for outpatient follow-up and review of imaging.  He is back to his baseline he continues to work.  Excellent functional status.  No current abdominal pain, flank pain or signs of liver failure or jaundice.  Stable weight and appetite.  He does not drink.  He continues to smoke occasionally.  Overall he is doing very well.  Past Medical History:  Diagnosis Date  . Alcohol dependence ([place])   . Anemia    denies  . Arthritis   . Cataract   . Cirrhosis ([place])   . Coronary artery disease due to lipid rich plaque   . Depression   . Hepatitis C    Has A and B also  . PAD (peripheral artery disease) ([place])    pt reports he has this and has chronic leg "soreness"  . Peripheral vascular disease ([place])    essentially normal circulations with two-vessel  runoff to the feet bilaterally  . Tobacco abuse     Past Surgical History:  Procedure Laterality Date  . CARDIAC CATHETERIZATION    . COLONOSCOPY    . CORONARY ARTERY BYPASS GRAFT  2010   Triple bypass at [facility]  . DOPPLER ECHOCARDIOGRAPHY  2011  . IR ANGIOGRAM SELECTIVE EACH ADDITIONAL VESSEL  10/27/2017  . IR ANGIOGRAM SELECTIVE EACH ADDITIONAL VESSEL  10/27/2017  . IR ANGIOGRAM SELECTIVE EACH ADDITIONAL VESSEL  10/27/2017  . IR ANGIOGRAM SELECTIVE EACH ADDITIONAL VESSEL  10/27/2017  . IR ANGIOGRAM SELECTIVE EACH ADDITIONAL VESSEL  10/27/2017  . IR ANGIOGRAM SELECTIVE EACH ADDITIONAL VESSEL  10/27/2017  . IR ANGIOGRAM SELECTIVE EACH  ADDITIONAL VESSEL  10/27/2017  . IR ANGIOGRAM SELECTIVE EACH ADDITIONAL VESSEL  11/06/2017  . IR ANGIOGRAM SELECTIVE EACH ADDITIONAL VESSEL  11/06/2017  . IR ANGIOGRAM SELECTIVE EACH ADDITIONAL VESSEL  11/06/2017  . IR ANGIOGRAM SELECTIVE EACH ADDITIONAL VESSEL  12/04/2017  . IR ANGIOGRAM SELECTIVE EACH ADDITIONAL VESSEL  12/04/2017  . IR ANGIOGRAM VISCERAL SELECTIVE  10/27/2017  . IR ANGIOGRAM VISCERAL SELECTIVE  11/06/2017  . IR ANGIOGRAM VISCERAL SELECTIVE  12/04/2017  . IR EMBO ARTERIAL NOT HEMORR HEMANG INC GUIDE ROADMAPPING  10/27/2017  . IR EMBO TUMOR ORGAN ISCHEMIA INFARCT INC GUIDE ROADMAPPING  11/06/2017  . IR EMBO TUMOR ORGAN ISCHEMIA INFARCT INC GUIDE ROADMAPPING  12/04/2017  . IR RADIOLOGIST EVAL & MGMT  08/12/2017  . IR RADIOLOGIST EVAL & MGMT  09/22/2017  . IR RADIOLOGIST EVAL & MGMT  01/07/2018  . IR RADIOLOGIST EVAL & MGMT  04/20/2018  . IR US GUIDE VASC ACCESS RIGHT  10/27/2017  . IR US GUIDE VASC ACCESS RIGHT  11/06/2017  . IR US GUIDE VASC ACCESS RIGHT  12/04/2017  . NM MYOVIEW LTD  2012    Allergies: Statins  Medications: Prior to Admission medications   Medication Sig Start Date End Date Taking? Authorizing Provider  acetaminophen (TYLENOL) 500 MG tablet Take 1,000 mg by mouth every 6 (six) hours as needed.    [provider]  albuterol (PROVENTIL HFA;VENTOLIN HFA)  108 (90 Base) MCG/ACT inhaler Inhale 2 puffs into the lungs every 6 (six) hours as needed for wheezing or shortness of breath. 02/15/18   Chesley Mires, MD  aspirin 81 MG tablet Take 81 mg by mouth daily.    [provider]  Cholecalciferol (VITAMIN D3 PO) Take 125 mcg by mouth daily.    [provider]  metroNIDAZOLE (FLAGYL) 500 MG tablet Take 1 tablet (500 mg total) by mouth 2 (two) times daily. 03/05/18   Henson, Vickie L, NP-C  Multiple Vitamin (MULTIVITAMIN WITH MINERALS) TABS tablet Take 1 tablet by mouth daily.    [provider]  pantoprazole (PROTONIX) 40 MG tablet 40 mg one  tab 30 minutes before breakfast meal 12/08/17   Milus Banister, MD  Tiotropium Bromide Monohydrate (SPIRIVA RESPIMAT) 1.25 MCG/ACT AERS Inhale 2 puffs into the lungs daily. 04/08/18   Martyn Ehrich, NP     Family History  Problem Relation Age of Onset  . Stroke Mother   . Colon cancer Neg Hx     Social History   Socioeconomic History  . Marital status: Married    Spouse name: Not on file  . Number of children: 1  . Years of education: Not on file  . Highest education level: Not on file  Occupational History  . Occupation: Retail buyer    Comment: city of [place]  . Financial resource strain: Not on file  . Food insecurity:    Worry: Not on file    Inability: Not on file  . Transportation needs:    Medical: Not on file    Non-medical: Not on file  Tobacco Use  . Smoking status: Current Some Day Smoker    Packs/day: 0.10    Years: 52.00    Pack years: 5.20    Types: Cigarettes  . Smokeless tobacco: Never Used  . Tobacco comment: Is trying to stop smoking, still has one or two per day most days  Substance and Sexual Activity  . Alcohol use: No    Frequency: Never    Comment: quit 04/2017; previous 40 year history 1 wine bottle per day  . Drug use: No    Comment: occ  . Sexual activity: Yes  Lifestyle  . Physical activity:    Days per week: Not on file    Minutes per session: Not on file  . Stress: Not on file  Relationships  . Social connections:    Talks on phone: Not on file    Gets together: Not on file    Attends religious service: Not on file    Active member of club or organization: Not on file    Attends meetings of clubs or organizations: Not on file    Relationship status: Not on file  Other Topics Concern  . Not on file  Social History Narrative  . Not on file    ECOGStatus: 1 - Symptomatic but completely ambulatory  Review of Systems: A 12 point ROS discussed and pertinent positives are indicated in the HPI above.  All  other systems are negative.  Review of Systems  Vital Signs: BP (!) 157/74   Pulse (!) 58   Temp 98.2 F (36.8 C) (Oral)   Resp 16   Ht 5\' 7"  (1.702 m)   Wt 59.9 kg   SpO2 99%   BMI 20.67 kg/m   Physical Exam Constitutional:      General: He is not in acute distress.  Appearance: Normal appearance. He is normal weight. He is not toxic-appearing.  Eyes:     General: No scleral icterus.    Conjunctiva/sclera: Conjunctivae normal.  Cardiovascular:     Rate and Rhythm: Normal rate and regular rhythm.     Pulses: Normal pulses.     Heart sounds: No murmur.  Pulmonary:     Effort: Pulmonary effort is normal. No respiratory distress.     Breath sounds: Normal breath sounds.  Abdominal:     General: Bowel sounds are normal. There is no distension.     Palpations: Abdomen is soft.     Tenderness: There is no abdominal tenderness.  Neurological:     General: No focal deficit present.     Mental Status: He is alert.  Psychiatric:        Mood and Affect: Mood normal.        Behavior: Behavior normal.     Imaging: Mr Abdomen W Wo Contrast  Result Date: 04/20/2018 CLINICAL DATA:  Multifocal hepatocellular carcinoma status post Y-90 radioembolization to the right liver lobe on 11/06/2017 and to the left liver lobe on 12/04/2017. Restaging. EXAM: MRI ABDOMEN WITHOUT AND WITH CONTRAST TECHNIQUE: Multiplanar multisequence MR imaging of the abdomen was performed both before and after the administration of intravenous contrast. Creatinine was obtained on site at [place] at [address] Results: Creatinine 0.7 mg/dL. CONTRAST:  9mL EOVIST GADOXETATE DISODIUM 0.25 MOL/L IV SOLN COMPARISON:  07/24/2017 MRI abdomen. FINDINGS: Lower chest: No acute abnormality at the lung bases. Partially visualized chronic deformity associated with the posterior left eighth and ninth ribs as better seen on 12/21/2017 chest CT. Hepatobiliary: There is relative hypertrophy of the lateral  segment left liver lobe, compatible with hepatic cirrhosis. No significant hepatic steatosis. Liver masses as follows: -segment 4A left liver lobe 4.5 x 3.7 cm mass with central necrosis and thick irregular peripheral arterial hyperenhancement (series 7/image 20), decreased from 5.5 x 4.6 cm on 07/24/2017 MRI -segment 5/6 inferior right liver lobe 3.0 x 1.7 cm mass with avid arterial hyperenhancement (series 7/image 55), increased from 2.4 x 1.6 cm -segment 6 right liver lobe 0.5 cm mass without arterial hyperenhancement (series 11/image 46), decreased from 1.0 cm -segment 4B left liver lobe 0.7 x 0.7 cm mass (series 7/image 38), decreased from 1.1 x 1.1 cm -segment 3 left liver lobe 0.5 cm mass with arterial hyperenhancement and hepatobiliary phase hypoenhancement (series 7/image 37), new Cholelithiasis. No gallbladder wall thickening or pericholecystic fluid. No biliary ductal dilatation. Common bile duct diameter 3 mm. No choledocholithiasis. Pancreas: No pancreatic mass or duct dilation. 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 Spleen: Normal size. No mass. Adrenals/Urinary Tract: Normal adrenals. No hydronephrosis. Subcentimeter simple renal cysts in both kidneys. No suspicious renal masses. Stomach/Bowel: Normal non-distended stomach. Visualized small and large bowel is normal caliber, with no bowel wall thickening. Vascular/Lymphatic: Normal caliber abdominal aorta. Patent portal, splenic, hepatic and renal veins. No pathologically enlarged lymph nodes in the abdomen. Other: No abdominal ascites or focal fluid collection. Musculoskeletal: There is a low T2 intensity 1.6 cm focus in the right iliac crest (series 4/image 14), not well evaluated on this abdomen MRI study, not appreciably changed back to 05/20/2010 MRI study, presumably benign. No new focal osseous lesions. IMPRESSION: 1. Mixed response to therapy. Dominant segment 4A left liver lobe tumor is decreased in size but demonstrates persistent thick  peripheral enhancement indicating viable tumor. Inferior right liver lobe 3.0 cm mass is increased in size and demonstrates persistent  avid arterial enhancement indicating viable tumor. Two additional smaller liver masses have decreased in size. One new subcentimeter segment 3 left liver lobe mass suspicious for new site of hepatocellular carcinoma. 2. Hepatic cirrhosis.  Normal size spleen.  No ascites. 3. No abdominal lymphadenopathy. 4. Cholelithiasis. Electronically Signed   By: Ilona Sorrel M.D.   On: 04/20/2018 10:26   Ir Radiologist Eval & Mgmt  Result Date: 04/20/2018 Please refer to notes tab for details about interventional procedure. (Op Note)   Labs:  CBC: Recent Labs    10/27/17 0811 11/06/17 0746 12/04/17 0800 02/25/18 1023  WBC 5.3 4.6 4.5 4.1  HGB 13.3 12.8* 12.3* 12.6*  HCT 38.8* 36.9* 35.8* 38.0*  PLT 107* 137* 130* 120*    COAGS: Recent Labs    09/22/17 1043 10/27/17 0811 11/06/17 0746 12/04/17 0800  INR 1.1* 1.09 1.09 1.00  APTT  --  30 30  --     BMP: Recent Labs    10/27/17 0811 11/06/17 0746 12/04/17 0850 02/25/18 1023  NA 141 140 141 143  K 4.1 3.6 3.7 4.0  CL 107 106 106 106  CO2 24 27 26 28   GLUCOSE 88 90 82 116*  BUN 8 7* 7* 6*  CALCIUM 9.4 9.5 9.2 9.6  CREATININE 0.65 0.69 0.70 0.88  GFRNONAA >60 >60 >60 >60  GFRAA >60 >60 >60 >60    LIVER FUNCTION TESTS: Recent Labs    10/27/17 0811 11/06/17 0746 12/04/17 0850 02/25/18 1023  BILITOT 0.5 0.8 1.0 0.6  AST 52* 36 29 36  ALT 28 22 15 18   ALKPHOS 108 85 85 128*  PROT 7.7 7.7 7.5 8.3*  ALBUMIN 3.4* 3.4* 3.3* 3.3*    TUMOR MARKERS: Recent Labs    09/22/17 1043  AFPTM 5,417.1*    Assessment and Plan:  Multifocal hepatocellular carcinoma in the setting of hepatitis C and cirrhosis.  He is now status post bilateral Y 90 embolizations 5 months ago.  He is recovered as an outpatient.  MRI today demonstrates significant treatment changes within the dominant segment for a  hepatic lesion now measuring 4.5 cm, previously 5.5 cm but there is significant central necrosis with some residual peripheral enhancement.  Additionally, the MR demonstrates a inferior right hepatic lobe segment 5/6 3 cm lesion with continued arterial hyperenhancement with slight increase in size.  AFP has decreased measuring 60, previously 15,095.  Additional small hepatic enhancing lesions are smaller in segment 6 and segment 4B now measuring 1 cm and 1.1 cm respectively.  Overall his MRI has improved compared to the prior study with significant treatment changes in the dominant segment 4a left hepatic lesion.  MRI studies and angiograms were reviewed with the patient in detail.  The dominant 3 cm right inferior hepatic lesion has  two-vessel supply from the right hepatic artery.  These 2 small branches appear to be amenable to micro-catheterization and subselective bland embolization.  The bland embolization procedure was reviewed in detail with the patient including the procedure, risk, benefits and alternatives.  He understands that this is another palliative option for the treatment of hepatocellular carcinoma.  After discussion he would like to proceed with subselective right hepatic bland embolization.  Plan: Scheduled for subselective right hepatic peripheral bland embolization for the dominant 3 cm right inferior hepatic lesion by MRI.  Electronically Signed: Greggory Keen 04/20/2018, 2:40 PM   I spent a total of    40 Minutes in face to face in clinical consultation, greater than 50% of  which was counseling/coordinating care for this patient with multifocal hepatocellular carcinoma.

## 2018-04-21 ENCOUNTER — Other Ambulatory Visit (HOSPITAL_COMMUNITY): Payer: Self-pay | Admitting: Interventional Radiology

## 2018-04-21 DIAGNOSIS — K769 Liver disease, unspecified: Secondary | ICD-10-CM

## 2018-05-05 ENCOUNTER — Other Ambulatory Visit: Payer: Self-pay | Admitting: Physician Assistant

## 2018-05-06 ENCOUNTER — Observation Stay (HOSPITAL_COMMUNITY)
Admission: RE | Admit: 2018-05-06 | Discharge: 2018-05-07 | Disposition: A | Discharge status: Home / Self Care | Payer: Medicare HMO | Source: Ambulatory Visit | Attending: Interventional Radiology | Admitting: Interventional Radiology

## 2018-05-06 ENCOUNTER — Encounter (HOSPITAL_COMMUNITY): Payer: Self-pay

## 2018-05-06 ENCOUNTER — Ambulatory Visit (HOSPITAL_COMMUNITY)
Admission: RE | Admit: 2018-05-06 | Discharge: 2018-05-06 | Disposition: A | Discharge status: Home / Self Care | Payer: Medicare HMO | Source: Ambulatory Visit | Attending: Interventional Radiology | Admitting: Interventional Radiology

## 2018-05-06 ENCOUNTER — Other Ambulatory Visit: Payer: Self-pay

## 2018-05-06 DIAGNOSIS — Z7982 Long term (current) use of aspirin: Secondary | ICD-10-CM | POA: Insufficient documentation

## 2018-05-06 DIAGNOSIS — M1990 Unspecified osteoarthritis, unspecified site: Secondary | ICD-10-CM | POA: Diagnosis not present

## 2018-05-06 DIAGNOSIS — I2510 Atherosclerotic heart disease of native coronary artery without angina pectoris: Secondary | ICD-10-CM | POA: Insufficient documentation

## 2018-05-06 DIAGNOSIS — C220 Liver cell carcinoma: Principal | ICD-10-CM | POA: Insufficient documentation

## 2018-05-06 DIAGNOSIS — Z823 Family history of stroke: Secondary | ICD-10-CM | POA: Insufficient documentation

## 2018-05-06 DIAGNOSIS — I739 Peripheral vascular disease, unspecified: Secondary | ICD-10-CM | POA: Insufficient documentation

## 2018-05-06 DIAGNOSIS — K7030 Alcoholic cirrhosis of liver without ascites: Secondary | ICD-10-CM | POA: Diagnosis not present

## 2018-05-06 DIAGNOSIS — Z79899 Other long term (current) drug therapy: Secondary | ICD-10-CM | POA: Insufficient documentation

## 2018-05-06 DIAGNOSIS — F17210 Nicotine dependence, cigarettes, uncomplicated: Secondary | ICD-10-CM | POA: Diagnosis not present

## 2018-05-06 DIAGNOSIS — Z951 Presence of aortocoronary bypass graft: Secondary | ICD-10-CM | POA: Diagnosis not present

## 2018-05-06 DIAGNOSIS — K769 Liver disease, unspecified: Secondary | ICD-10-CM

## 2018-05-06 DIAGNOSIS — Z888 Allergy status to other drugs, medicaments and biological substances status: Secondary | ICD-10-CM | POA: Insufficient documentation

## 2018-05-06 HISTORY — PX: IR ANGIOGRAM VISCERAL SELECTIVE: IMG657

## 2018-05-06 HISTORY — PX: IR EMBO TUMOR ORGAN ISCHEMIA INFARCT INC GUIDE ROADMAPPING: IMG5449

## 2018-05-06 HISTORY — PX: IR ANGIOGRAM SELECTIVE EACH ADDITIONAL VESSEL: IMG667

## 2018-05-06 HISTORY — PX: IR US GUIDE VASC ACCESS RIGHT: IMG2390

## 2018-05-06 LAB — CBC WITH DIFFERENTIAL/PLATELET
Abs Immature Granulocytes: 0.01 10*3/uL (ref 0.00–0.07)
Basophils Absolute: 0 10*3/uL (ref 0.0–0.1)
Basophils Relative: 1 %
Eosinophils Absolute: 0.3 10*3/uL (ref 0.0–0.5)
Eosinophils Relative: 6 %
HCT: 40.9 % (ref 39.0–52.0)
Hemoglobin: 13.1 g/dL (ref 13.0–17.0)
Immature Granulocytes: 0 %
Lymphocytes Relative: 34 %
Lymphs Abs: 1.4 10*3/uL (ref 0.7–4.0)
MCH: 31.1 pg (ref 26.0–34.0)
MCHC: 32 g/dL (ref 30.0–36.0)
MCV: 97.1 fL (ref 80.0–100.0)
Monocytes Absolute: 0.5 10*3/uL (ref 0.1–1.0)
Monocytes Relative: 12 %
Neutro Abs: 1.9 10*3/uL (ref 1.7–7.7)
Neutrophils Relative %: 47 %
Platelets: 103 10*3/uL — ABNORMAL LOW (ref 150–400)
RBC: 4.21 MIL/uL — ABNORMAL LOW (ref 4.22–5.81)
RDW: 14.6 % (ref 11.5–15.5)
WBC: 4 10*3/uL (ref 4.0–10.5)
nRBC: 0 % (ref 0.0–0.2)

## 2018-05-06 LAB — COMPREHENSIVE METABOLIC PANEL
ALT: 22 U/L (ref 0–44)
AST: 42 U/L — ABNORMAL HIGH (ref 15–41)
Albumin: 3.7 g/dL (ref 3.5–5.0)
Alkaline Phosphatase: 107 U/L (ref 38–126)
Anion gap: 9 (ref 5–15)
BUN: 7 mg/dL — ABNORMAL LOW (ref 8–23)
CO2: 23 mmol/L (ref 22–32)
Calcium: 9.4 mg/dL (ref 8.9–10.3)
Chloride: 106 mmol/L (ref 98–111)
Creatinine, Ser: 0.79 mg/dL (ref 0.61–1.24)
GFR calc Af Amer: >60 mL/min (ref >60)
GFR calc non Af Amer: >60 mL/min (ref >60)
Glucose, Bld: 89 mg/dL (ref 70–99)
Potassium: 3.7 mmol/L (ref 3.5–5.1)
Sodium: 138 mmol/L (ref 135–145)
Total Bilirubin: 0.9 mg/dL (ref 0.3–1.2)
Total Protein: 8 g/dL (ref 6.5–8.1)

## 2018-05-06 LAB — PROTIME-INR
INR: 1.05
Prothrombin Time: 13.6 seconds (ref 11.4–15.2)

## 2018-05-06 MED ORDER — OXYCODONE-ACETAMINOPHEN 5-325 MG PO TABS
1.0000 | ORAL_TABLET | ORAL | Status: DC | PRN
  Administered 2018-05-06: 1 via ORAL
  Filled 2018-05-06: qty 1

## 2018-05-06 MED ORDER — DEXAMETHASONE SODIUM PHOSPHATE 10 MG/ML IJ SOLN
20.0000 mg | Freq: Once | INTRAMUSCULAR | Status: AC
  Administered 2018-05-06: 20 mg via INTRAVENOUS
  Filled 2018-05-06: qty 2

## 2018-05-06 MED ORDER — FENTANYL CITRATE (PF) 100 MCG/2ML IJ SOLN
INTRAMUSCULAR | Status: AC | PRN
  Administered 2018-05-06 (×2): 50 ug via INTRAVENOUS

## 2018-05-06 MED ORDER — ALBUTEROL SULFATE (2.5 MG/3ML) 0.083% IN NEBU: 3.0000 mL | INHALATION_SOLUTION | Freq: Four times a day (QID) | RESPIRATORY_TRACT | Status: DC | PRN

## 2018-05-06 MED ORDER — PANTOPRAZOLE SODIUM 40 MG IV SOLR
INTRAVENOUS | Status: AC
  Administered 2018-05-06: 40 mg via INTRAVENOUS
  Filled 2018-05-06: qty 40

## 2018-05-06 MED ORDER — TIOTROPIUM BROMIDE MONOHYDRATE 1.25 MCG/ACT IN AERS: 2.0000 | INHALATION_SPRAY | Freq: Every day | RESPIRATORY_TRACT | Status: DC

## 2018-05-06 MED ORDER — IOHEXOL 300 MG/ML  SOLN
100.0000 mL | Freq: Once | INTRAMUSCULAR | Status: AC | PRN
  Administered 2018-05-06: 60 mL via INTRA_ARTERIAL

## 2018-05-06 MED ORDER — SODIUM CHLORIDE 0.9 % IV SOLN
INTRAVENOUS | Status: DC
  Administered 2018-05-06 – 2018-05-07 (×2): via INTRAVENOUS

## 2018-05-06 MED ORDER — PANTOPRAZOLE SODIUM 40 MG IV SOLR
40.0000 mg | Freq: Once | INTRAVENOUS | Status: AC
  Administered 2018-05-06: 40 mg via INTRAVENOUS

## 2018-05-06 MED ORDER — UMECLIDINIUM BROMIDE 62.5 MCG/INH IN AEPB
1.0000 | INHALATION_SPRAY | Freq: Every day | RESPIRATORY_TRACT | Status: DC
  Administered 2018-05-07: 1 via RESPIRATORY_TRACT
  Filled 2018-05-06: qty 7

## 2018-05-06 MED ORDER — MIDAZOLAM HCL 2 MG/2ML IJ SOLN
INTRAMUSCULAR | Status: AC | PRN
  Administered 2018-05-06 (×4): 1 mg via INTRAVENOUS

## 2018-05-06 MED ORDER — FENTANYL CITRATE (PF) 100 MCG/2ML IJ SOLN
INTRAMUSCULAR | Status: AC
  Filled 2018-05-06: qty 2

## 2018-05-06 MED ORDER — MIDAZOLAM HCL 2 MG/2ML IJ SOLN
INTRAMUSCULAR | Status: AC
  Filled 2018-05-06: qty 4

## 2018-05-06 MED ORDER — SODIUM CHLORIDE 0.9 % IV SOLN
8.0000 mg | Freq: Four times a day (QID) | INTRAVENOUS | Status: DC | PRN
  Filled 2018-05-06: qty 4

## 2018-05-06 MED ORDER — PIPERACILLIN-TAZOBACTAM 3.375 G IVPB 30 MIN
3.3750 g | Freq: Once | INTRAVENOUS | Status: AC
  Administered 2018-05-06: 3.375 g via INTRAVENOUS
  Filled 2018-05-06: qty 50

## 2018-05-06 MED ORDER — PANTOPRAZOLE SODIUM 40 MG PO TBEC
40.0000 mg | DELAYED_RELEASE_TABLET | Freq: Every day | ORAL | Status: DC
  Administered 2018-05-07: 40 mg via ORAL
  Filled 2018-05-06: qty 1

## 2018-05-06 MED ORDER — SODIUM CHLORIDE 0.9 % IV SOLN
INTRAVENOUS | Status: DC
  Administered 2018-05-06: 12:00:00 via INTRAVENOUS

## 2018-05-06 MED ORDER — ADULT MULTIVITAMIN W/MINERALS CH
1.0000 | ORAL_TABLET | Freq: Every day | ORAL | Status: DC
  Administered 2018-05-06 – 2018-05-07 (×2): 1 via ORAL
  Filled 2018-05-06: qty 1

## 2018-05-06 MED ORDER — SODIUM CHLORIDE 0.9 % IV SOLN
8.0000 mg | Freq: Once | INTRAVENOUS | Status: AC
  Administered 2018-05-06: 8 mg via INTRAVENOUS
  Filled 2018-05-06: qty 4

## 2018-05-06 MED ORDER — ASPIRIN EC 81 MG PO TBEC
81.0000 mg | DELAYED_RELEASE_TABLET | Freq: Every day | ORAL | Status: DC
  Administered 2018-05-07: 81 mg via ORAL
  Filled 2018-05-06: qty 1

## 2018-05-06 NOTE — H&P (Signed)
Chief Complaint: Patient was seen in consultation today for right hepatic lesion.  Referring Physician(s): Beverley Fiedler, Burr Medico  Supervising Physician: Daryll Brod  Patient Status: [facility] - Out-pt  History of Present Illness: Zachary Steele is a 68 y.o. male with a past medical history of CAD, PAD, PVD, cirrhosis, hepatitis C, hepatocellular carcinoma, arthritis, anemia, tobacco abuse, and alcohol dependence. He was unfortunately diagnosed with hepatocellular carcinoma in 07/2017. He underwent a right hepatic Y90 radioembolization 11/06/2017 by Dr. Annamaria Boots along with a left Y90 radioembolization 12/04/2017 by Dr. Annamaria Boots. He was last seen in follow-up with Dr. Annamaria Boots 04/20/2018 when he had imaging scans which revealed an inferior right hepatic lesion. At that time, it was determined that patient would undergo a right hepatic bland embolization.  MR abdomen 04/20/2018: 1. Mixed response to therapy. Dominant segment 4A left liver lobe tumor is decreased in size but demonstrates persistent thick peripheral enhancement indicating viable tumor. Inferior right liver lobe 3.0 cm mass is increased in size and demonstrates persistent avid arterial enhancement indicating viable tumor. Two additional smaller liver masses have decreased in size. One new subcentimeter segment 3 left liver lobe mass suspicious for new site of hepatocellular carcinoma. 2. Hepatic cirrhosis.  Normal size spleen.  No ascites. 3. No abdominal lymphadenopathy. 4. Cholelithiasis.  Patient presents today for possible image-guided mesenteric angiogram with possible right hepatic bland embolization. Patient awake and alert sitting in bed with no complaints at this time. Accompanied by wife. Denies fever, chills, chest pain, dyspnea, abdominal pain, or headache.   Past Medical History:  Diagnosis Date  . Alcohol dependence ([place])   . Anemia    denies  . Arthritis   . Cataract   . Cirrhosis ([place])   . Coronary artery disease due to lipid  rich plaque   . Depression   . Hepatitis C    Has A and B also  . PAD (peripheral artery disease) ([place])    pt reports he has this and has chronic leg "soreness"  . Peripheral vascular disease ([place])    essentially normal circulations with two-vessel  runoff to the feet bilaterally  . Tobacco abuse     Past Surgical History:  Procedure Laterality Date  . CARDIAC CATHETERIZATION    . COLONOSCOPY    . CORONARY ARTERY BYPASS GRAFT  2010   Triple bypass at [facility]  . DOPPLER ECHOCARDIOGRAPHY  2011  . IR ANGIOGRAM SELECTIVE EACH ADDITIONAL VESSEL  10/27/2017  . IR ANGIOGRAM SELECTIVE EACH ADDITIONAL VESSEL  10/27/2017  . IR ANGIOGRAM SELECTIVE EACH ADDITIONAL VESSEL  10/27/2017  . IR ANGIOGRAM SELECTIVE EACH ADDITIONAL VESSEL  10/27/2017  . IR ANGIOGRAM SELECTIVE EACH ADDITIONAL VESSEL  10/27/2017  . IR ANGIOGRAM SELECTIVE EACH ADDITIONAL VESSEL  10/27/2017  . IR ANGIOGRAM SELECTIVE EACH ADDITIONAL VESSEL  10/27/2017  . IR ANGIOGRAM SELECTIVE EACH ADDITIONAL VESSEL  11/06/2017  . IR ANGIOGRAM SELECTIVE EACH ADDITIONAL VESSEL  11/06/2017  . IR ANGIOGRAM SELECTIVE EACH ADDITIONAL VESSEL  11/06/2017  . IR ANGIOGRAM SELECTIVE EACH ADDITIONAL VESSEL  12/04/2017  . IR ANGIOGRAM SELECTIVE EACH ADDITIONAL VESSEL  12/04/2017  . IR ANGIOGRAM VISCERAL SELECTIVE  10/27/2017  . IR ANGIOGRAM VISCERAL SELECTIVE  11/06/2017  . IR ANGIOGRAM VISCERAL SELECTIVE  12/04/2017  . IR EMBO ARTERIAL NOT HEMORR HEMANG INC GUIDE ROADMAPPING  10/27/2017  . IR EMBO TUMOR ORGAN ISCHEMIA INFARCT INC GUIDE ROADMAPPING  11/06/2017  . IR EMBO TUMOR ORGAN ISCHEMIA INFARCT INC GUIDE ROADMAPPING  12/04/2017  . IR RADIOLOGIST EVAL & MGMT  08/12/2017  . IR RADIOLOGIST EVAL & MGMT  09/22/2017  . IR RADIOLOGIST EVAL & MGMT  01/07/2018  . IR RADIOLOGIST EVAL & MGMT  04/20/2018  . IR US GUIDE VASC ACCESS RIGHT  10/27/2017  . IR US GUIDE VASC ACCESS RIGHT  11/06/2017  . IR US GUIDE VASC ACCESS RIGHT  12/04/2017  . NM MYOVIEW LTD  2012     Allergies: Statins  Medications: Prior to Admission medications   Medication Sig Start Date End Date Taking? Authorizing Provider  acetaminophen (TYLENOL) 500 MG tablet Take 1,000 mg by mouth every 6 (six) hours as needed.    [provider]  albuterol (PROVENTIL HFA;VENTOLIN HFA) 108 (90 Base) MCG/ACT inhaler Inhale 2 puffs into the lungs every 6 (six) hours as needed for wheezing or shortness of breath. 02/15/18   Chesley Mires, MD  aspirin 81 MG tablet Take 81 mg by mouth daily.    [provider]  Cholecalciferol (VITAMIN D3 PO) Take 125 mcg by mouth daily.    [provider]  metroNIDAZOLE (FLAGYL) 500 MG tablet Take 1 tablet (500 mg total) by mouth 2 (two) times daily. 03/05/18   Henson, Vickie L, NP-C  Multiple Vitamin (MULTIVITAMIN WITH MINERALS) TABS tablet Take 1 tablet by mouth daily.    [provider]  pantoprazole (PROTONIX) 40 MG tablet 40 mg one tab 30 minutes before breakfast meal 12/08/17   Milus Banister, MD  Tiotropium Bromide Monohydrate (SPIRIVA RESPIMAT) 1.25 MCG/ACT AERS Inhale 2 puffs into the lungs daily. 04/08/18   Martyn Ehrich, NP     Family History  Problem Relation Age of Onset  . Stroke Mother   . Colon cancer Neg Hx     Social History   Socioeconomic History  . Marital status: Married    Spouse name: Not on file  . Number of children: 1  . Years of education: Not on file  . Highest education level: Not on file  Occupational History  . Occupation: Retail buyer    Comment: city of [place]  . Financial resource strain: Not on file  . Food insecurity:    Worry: Not on file    Inability: Not on file  . Transportation needs:    Medical: Not on file    Non-medical: Not on file  Tobacco Use  . Smoking status: Current Some Day Smoker    Packs/day: 0.10    Years: 52.00    Pack years: 5.20    Types: Cigarettes  . Smokeless tobacco: Never Used  . Tobacco comment: Is trying to stop  smoking, still has one or two per day most days  Substance and Sexual Activity  . Alcohol use: No    Frequency: Never    Comment: quit 04/2017; previous 40 year history 1 wine bottle per day  . Drug use: No    Comment: occ  . Sexual activity: Yes  Lifestyle  . Physical activity:    Days per week: Not on file    Minutes per session: Not on file  . Stress: Not on file  Relationships  . Social connections:    Talks on phone: Not on file    Gets together: Not on file    Attends religious service: Not on file    Active member of club or organization: Not on file    Attends meetings of clubs or organizations: Not on file    Relationship status: Not on file  Other Topics Concern  .  Not on file  Social History Narrative  . Not on file     Review of Systems: A 12 point ROS discussed and pertinent positives are indicated in the HPI above.  All other systems are negative.  Review of Systems  Constitutional: Negative for chills and fever.  Respiratory: Negative for shortness of breath and wheezing.   Cardiovascular: Negative for chest pain and palpitations.  Gastrointestinal: Negative for abdominal pain.  Neurological: Negative for headaches.  Psychiatric/Behavioral: Negative for behavioral problems and confusion.    Vital Signs: There were no vitals taken for this visit.  Physical Exam Vitals signs and nursing note reviewed.  Constitutional:      General: He is not in acute distress.    Appearance: Normal appearance.  Cardiovascular:     Rate and Rhythm: Normal rate and regular rhythm.     Heart sounds: Normal heart sounds. No murmur.  Pulmonary:     Effort: Pulmonary effort is normal. No respiratory distress.     Breath sounds: Normal breath sounds. No wheezing.  Skin:    General: Skin is warm and dry.  Neurological:     Mental Status: He is alert and oriented to person, place, and time.  Psychiatric:        Mood and Affect: Mood normal.        Behavior: Behavior  normal.        Thought Content: Thought content normal.        Judgment: Judgment normal.      MD Evaluation Airway: WNL Heart: WNL Abdomen: WNL Chest/ Lungs: WNL ASA  Classification: 3 Mallampati/Airway Score: One   Imaging: Mr Abdomen W Wo Contrast  Result Date: 04/20/2018 CLINICAL DATA:  Multifocal hepatocellular carcinoma status post Y-90 radioembolization to the right liver lobe on 11/06/2017 and to the left liver lobe on 12/04/2017. Restaging. EXAM: MRI ABDOMEN WITHOUT AND WITH CONTRAST TECHNIQUE: Multiplanar multisequence MR imaging of the abdomen was performed both before and after the administration of intravenous contrast. Creatinine was obtained on site at [place] at [address] Results: Creatinine 0.7 mg/dL. CONTRAST:  16mL EOVIST GADOXETATE DISODIUM 0.25 MOL/L IV SOLN COMPARISON:  07/24/2017 MRI abdomen. FINDINGS: Lower chest: No acute abnormality at the lung bases. Partially visualized chronic deformity associated with the posterior left eighth and ninth ribs as better seen on 12/21/2017 chest CT. Hepatobiliary: There is relative hypertrophy of the lateral segment left liver lobe, compatible with hepatic cirrhosis. No significant hepatic steatosis. Liver masses as follows: -segment 4A left liver lobe 4.5 x 3.7 cm mass with central necrosis and thick irregular peripheral arterial hyperenhancement (series 7/image 20), decreased from 5.5 x 4.6 cm on 07/24/2017 MRI -segment 5/6 inferior right liver lobe 3.0 x 1.7 cm mass with avid arterial hyperenhancement (series 7/image 55), increased from 2.4 x 1.6 cm -segment 6 right liver lobe 0.5 cm mass without arterial hyperenhancement (series 11/image 46), decreased from 1.0 cm -segment 4B left liver lobe 0.7 x 0.7 cm mass (series 7/image 38), decreased from 1.1 x 1.1 cm -segment 3 left liver lobe 0.5 cm mass with arterial hyperenhancement and hepatobiliary phase hypoenhancement (series 7/image 37), new Cholelithiasis. No  gallbladder wall thickening or pericholecystic fluid. No biliary ductal dilatation. Common bile duct diameter 3 mm. No choledocholithiasis. Pancreas: No pancreatic mass or duct dilation. 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 Spleen: Normal size. No mass. Adrenals/Urinary Tract: Normal adrenals. No hydronephrosis. Subcentimeter simple renal cysts in both kidneys. No suspicious renal masses. Stomach/Bowel: Normal non-distended stomach. Visualized small and  large bowel is normal caliber, with no bowel wall thickening. Vascular/Lymphatic: Normal caliber abdominal aorta. Patent portal, splenic, hepatic and renal veins. No pathologically enlarged lymph nodes in the abdomen. Other: No abdominal ascites or focal fluid collection. Musculoskeletal: There is a low T2 intensity 1.6 cm focus in the right iliac crest (series 4/image 14), not well evaluated on this abdomen MRI study, not appreciably changed back to 05/20/2010 MRI study, presumably benign. No new focal osseous lesions. IMPRESSION: 1. Mixed response to therapy. Dominant segment 4A left liver lobe tumor is decreased in size but demonstrates persistent thick peripheral enhancement indicating viable tumor. Inferior right liver lobe 3.0 cm mass is increased in size and demonstrates persistent avid arterial enhancement indicating viable tumor. Two additional smaller liver masses have decreased in size. One new subcentimeter segment 3 left liver lobe mass suspicious for new site of hepatocellular carcinoma. 2. Hepatic cirrhosis.  Normal size spleen.  No ascites. 3. No abdominal lymphadenopathy. 4. Cholelithiasis. Electronically Signed   By: Ilona Sorrel M.D.   On: 04/20/2018 10:26   Ir Radiologist Eval & Mgmt  Result Date: 04/20/2018 Please refer to notes tab for details about interventional procedure. (Op Note)   Labs:  CBC: Recent Labs    10/27/17 0811 11/06/17 0746 12/04/17 0800 02/25/18 1023  WBC 5.3 4.6 4.5 4.1  HGB 13.3 12.8* 12.3* 12.6*  HCT 38.8*  36.9* 35.8* 38.0*  PLT 107* 137* 130* 120*    COAGS: Recent Labs    10/27/17 0811 11/06/17 0746 12/04/17 0800 05/06/18 1213  INR 1.09 1.09 1.00 1.05  APTT 30 30  --   --     BMP: Recent Labs    10/27/17 0811 11/06/17 0746 12/04/17 0850 02/25/18 1023  NA 141 140 141 143  K 4.1 3.6 3.7 4.0  CL 107 106 106 106  CO2 24 27 26 28   GLUCOSE 88 90 82 116*  BUN 8 7* 7* 6*  CALCIUM 9.4 9.5 9.2 9.6  CREATININE 0.65 0.69 0.70 0.88  GFRNONAA >60 >60 >60 >60  GFRAA >60 >60 >60 >60    LIVER FUNCTION TESTS: Recent Labs    10/27/17 0811 11/06/17 0746 12/04/17 0850 02/25/18 1023  BILITOT 0.5 0.8 1.0 0.6  AST 52* 36 29 36  ALT 28 22 15 18   ALKPHOS 108 85 85 128*  PROT 7.7 7.7 7.5 8.3*  ALBUMIN 3.4* 3.4* 3.3* 3.3*    TUMOR MARKERS: Recent Labs    09/22/17 1043  AFPTM 5,417.1*    Assessment and Plan:  Right hepatic lesion. Plan for image-guided mesenteric angiogram with possible right hepatic lesion bland embolization today with Dr. Annamaria Boots. Patient is NPO. Afebrile and WBCs WNL. He does not take blood thinners. INR 1.05 seconds today.  The risks and benefits of embolization were discussed with the patient including, but not limited to bleeding, infection, vascular injury, post operative pain, or contrast induced renal failure. This procedure involves the use of X-rays and because of the nature of the planned procedure, it is possible that we will have prolonged use of X-ray fluoroscopy. Potential radiation risks to you include (but are not limited to) the following: - A slightly elevated risk for cancer several years later in life. This risk is typically less than 0.5% percent. This risk is low in comparison to the normal incidence of human cancer, which is 33% for women and 50% for men according to the [place]. - Radiation induced injury can include skin redness, resembling a rash, tissue breakdown /  ulcers and hair loss (which can be temporary or  permanent).  The likelihood of either of these occurring depends on the difficulty of the procedure and whether you are sensitive to radiation due to previous procedures, disease, or genetic conditions.  IF your procedure requires a prolonged use of radiation, you will be notified and given written instructions for further action.  It is your responsibility to monitor the irradiated area for the 2 weeks following the procedure and to notify your physician if you are concerned that you have suffered a radiation induced injury.   All of the patient's questions were answered, patient is agreeable to proceed. Consent signed and in chart.   Thank you for this interesting consult.  I greatly enjoyed meeting JULEN RUBERT and look forward to participating in their care.  A copy of this report was sent to the requesting provider on this date.  Electronically Signed: Earley Abide, PA-C 05/06/2018, 12:33 PM   I spent a total of 40 Minutes in face to face in clinical consultation, greater than 50% of which was counseling/coordinating care for right hepatic lesion.

## 2018-05-06 NOTE — Discharge Instructions (Addendum)
Moderate Conscious Sedation, Adult, Care After °These instructions provide you with information about caring for yourself after your procedure. Your health care provider may also give you more specific instructions. Your treatment has been planned according to current medical practices, but problems sometimes occur. Call your health care provider if you have any problems or questions after your procedure. °What can I expect after the procedure? °After your procedure, it is common: °· To feel sleepy for several hours. °· To feel clumsy and have poor balance for several hours. °· To have poor judgment for several hours. °· To vomit if you eat too soon. °Follow these instructions at home: °For at least 24 hours after the procedure: ° °· Do not: °? Participate in activities where you could fall or become injured. °? Drive. °? Use heavy machinery. °? Drink alcohol. °? Take sleeping pills or medicines that cause drowsiness. °? Make important decisions or sign legal documents. °? Take care of children on your own. °· Rest. °Eating and drinking °· Follow the diet recommended by your health care provider. °· If you vomit: °? Drink water, juice, or soup when you can drink without vomiting. °? Make sure you have little or no nausea before eating solid foods. °General instructions °· Have a responsible adult stay with you until you are awake and alert. °· Take over-the-counter and prescription medicines only as told by your health care provider. °· If you smoke, do not smoke without supervision. °· Keep all follow-up visits as told by your health care provider. This is important. °Contact a health care provider if: °· You keep feeling nauseous or you keep vomiting. °· You feel light-headed. °· You develop a rash. °· You have a fever. °Get help right away if: °· You have trouble breathing. °This information is not intended to replace advice given to you by your health care provider. Make sure you discuss any questions you have  with your health care provider. °Document Released: 01/19/2013 Document Revised: 09/03/2015 Document Reviewed: 07/21/2015 °Elsevier Interactive Patient Education © 2019 [employer] ° °

## 2018-05-06 NOTE — Sedation Documentation (Signed)
Patient is resting comfortably with eyes closed, snoring lightly, in NAD. 

## 2018-05-06 NOTE — Procedures (Signed)
Multifocal HCC  S/p peripheral rt hepatic HCC bland embo  No comp Stable ebl min Full report in pacs

## 2018-05-07 ENCOUNTER — Other Ambulatory Visit: Payer: Self-pay | Admitting: Physician Assistant

## 2018-05-07 DIAGNOSIS — C220 Liver cell carcinoma: Secondary | ICD-10-CM | POA: Diagnosis not present

## 2018-05-07 LAB — AFP TUMOR MARKER: AFP, Serum, Tumor Marker: 48.5 ng/mL — ABNORMAL HIGH (ref 0.0–8.3)

## 2018-05-07 MED ORDER — HYDROMORPHONE HCL 1 MG/ML IJ SOLN
0.5000 mg | Freq: Once | INTRAMUSCULAR | Status: AC
  Administered 2018-05-07: 0.5 mg via INTRAVENOUS
  Filled 2018-05-07: qty 1

## 2018-05-07 MED ORDER — ALUM & MAG HYDROXIDE-SIMETH 200-200-20 MG/5ML PO SUSP
10.0000 mL | Freq: Once | ORAL | Status: AC
  Administered 2018-05-07: 10 mL via ORAL
  Filled 2018-05-07: qty 30

## 2018-05-07 NOTE — Progress Notes (Signed)
Reviewed discharge paperwork, follow up appointments, & medication regimen with patient. Patient discharged via wheelchair by NT.

## 2018-05-07 NOTE — Discharge Summary (Signed)
Patient ID: Zachary Steele MRN: [id number] DOB/AGE: 68-13-1952 68 y.o.  Admit date: 05/06/2018 Discharge date: 05/07/2018  Supervising Physician: Aletta Edouard  Patient Status: [facility] - In-pt  Admission Diagnoses:  Hepatocellular carcinoma  Discharge Diagnoses:  Active Problems:   Hepatocellular carcinoma [facility])   Discharged Condition: good  Hospital Course:   Zachary Steele is a 68 y.o. male with a past medical history of CAD, PAD, PVD, cirrhosis, hepatitis C, hepatocellular carcinoma, arthritis, anemia, tobacco abuse, and alcohol dependence.   He was unfortunately diagnosed with hepatocellular carcinoma in 07/2017.   He underwent a right hepatic Y90 radioembolization 11/06/2017 by Dr. Annamaria Boots along with a left Y90 radioembolization 12/04/2017 by Dr. Annamaria Boots.   He was last seen in follow-up with Dr. Annamaria Boots 04/20/2018 when he had imaging scans which revealed an inferior right hepatic lesion.   At that time, it was determined that patient would undergo a right hepatic bland embolization.  The procedure was done yesterday by Dr. Annamaria Boots.  The patient did very well post -operatively except for some RUQ discomfort.  I did explain this was not unusual with embolization.   He is tolerating a diet, he has ambulated and voided, and is ready for discharge home.  Consults: None  Significant Diagnostic Studies: Lab Results  Component Value Date   WBC 4.0 05/06/2018   HGB 13.1 05/06/2018   HCT 40.9 05/06/2018   MCV 97.1 05/06/2018   PLT 103 (L) 05/06/2018   Lab Results  Component Value Date   ALT 22 05/06/2018   AST 42 (H) 05/06/2018   ALKPHOS 107 05/06/2018   BILITOT 0.9 05/06/2018     Treatments:  INDICATION: Multifocal hepatocellular carcinoma  EXAM: ULTRASOUND GUIDANCE FOR VASCULAR ACCESS  CELIAC, COMMON HEPATIC, RIGHT HEPATIC, AND 2 ADDITIONAL PERIPHERAL RIGHT HEPATIC BRANCHES CATHETERIZATIONS AND ANGIOGRAMS  2 PERIPHERAL RIGHT HEPATIC BRANCHES WERE  SUCCESSFULLY CATHETERIZED FOR BLAND EMBOLIZATION OF THE ENLARGING INFERIOR RIGHT HEPATIC HCC WITHIN SEGMENT 5/6.  MEDICATIONS: 3.375 G ZOSYN. The antibiotic was administered within 1 hour of the procedure, 8 MG ZOFRAN  ANESTHESIA/SEDATION: Moderate (conscious) sedation was employed during this procedure. A total of Versed 4.0 mg and Fentanyl 100 mcg was administered intravenously.  Moderate Sedation Time: 43 minutes. The patient's level of consciousness and vital signs were monitored continuously by radiology nursing throughout the procedure under my direct supervision.  CONTRAST:  75 cc  FLUOROSCOPY TIME:  Fluoroscopy Time: 17 minutes 30 seconds (442 mGy).  COMPLICATIONS: None immediate.  PROCEDURE: Informed consent was obtained from the patient following explanation of the procedure, risks, benefits and alternatives. The patient understands, agrees and consents for the procedure. All questions were addressed. A time out was performed prior to the initiation of the procedure. Maximal barrier sterile technique utilized including caps, mask, sterile gowns, sterile gloves, large sterile drape, hand hygiene, and Betadine prep.  Under sterile conditions and local anesthesia, ultrasound micropuncture access performed the right common femoral artery. Images obtained for documentation. Five French sheath inserted over a guidewire. C2 catheter utilized to select the celiac origin.  Celiac: Initial celiac angiogram confirms patent celiac origin including its branches. The left gastric, splenic and hepatic vasculature all remain patent. Previous coil embolization of the GDA as well as the cystic artery.  Catheter advanced over a Glidewire into the common hepatic artery. Common hepatic angiogram performed.  Common hepatic: The common, proper, left and right hepatic vasculature all patent. There is significant decrease in the dominant central hepatic mass tumor  vascularity following 2  Y 90 embolizations. There remains tumor blushing to the right inferior hepatic lobe correlating with the known 3 cm hepatocellular carcinoma by MRI. This is today's target for bland embolization.  Renegade high flow microcatheter over a single angle Glidewire was advanced more peripherally into the right hepatic artery. Right hepatic angiogram performed.  Right hepatic: The right hepatic artery remains patent. There is decrease in the central hepatic mass tumor vascularity. There is tumor blushing within the known 3 cm lesion in the inferior right lobe with what appears to be 2 supplying dominant branches.  Microcatheter was advanced over a single angle GT Glidewire into the more superior branch supplying the right lobe lesion. Peripheral angiogram performed.  Superior peripheral right hepatic branch to the tumor: This branch is the dominant supply to the lesion. There is hypervascularity and tumor blushing. No significant shunting.  Bland embolization: From this location, bland embolization was performed of this branch to the tumor with 100-300 micron embospheres. Approximately 1/2 of a vial was instilled slowly under intermittent fluoroscopy until stasis was obtained.  Catheter was retracted and the inferior branch to the area was accessed with a microcatheter. Angiogram performed of the inferior peripheral right hepatic branch.  Inferior peripheral right hepatic branch to the tumor: Angiogram of this vessel demonstrates very minimal peripheral supply to the tumor. This is the non dominant supplying branch.  Additional bland embolization: From this location, bland embolization was performed with 1/4 of a vial of 100-300 micron embospheres slowly instilled under intermittent fluoroscopy. Stasis was obtained.  Following embolization of these 2 branches to the lesion, the catheter was retracted into the right hepatic artery. Repeat  right hepatic angiogram performed.  The supplying branches demonstrate stasis following bland embolization. The main right hepatic branch remains patent. No additional significant tumor supply appreciated to the inferior right hepatic mass.  Access removed. Hemostasis obtained with the ExoSeal device. Patient tolerated the procedure well. No immediate complication.  IMPRESSION: Successful peripheral right hepatic bland embolization to an enlarging 3 cm right inferior hepatocellular carcinoma by MRI. Two supplying branches were identified. Both of these peripheral right hepatic branches were successfully embolized with 100-300 micron embospheres for treatment. (First peripheral right hepatic bland embolization)   Electronically Signed   By: Jerilynn Mages.  Shick M.D.   On: 05/06/2018 15:58  Discharge Exam: Blood pressure 130/69, pulse 63, resp. rate 16, SpO2 98 %. Awake and alert Lying in bed. NAD Right CFA cath site looks good, no hematoma, no pseudoaneurysm.   Disposition: Discharge disposition: 01-Home or Self Care     F/U in 4 weeks. Our office will call patient with appointment date and time.  Discharge Instructions    Call MD for:  persistant nausea and vomiting   Complete by:  As directed    Call MD for:  severe uncontrolled pain   Complete by:  As directed    Call MD for:  temperature >100.4   Complete by:  As directed    Diet - low sodium heart healthy   Complete by:  As directed    Increase activity slowly   Complete by:  As directed      Allergies as of 05/07/2018      Reactions   Statins Other (See Comments)   Alcoholic cirrhosis, cardiology has declined to start statin      Medication List    TAKE these medications   acetaminophen 500 MG tablet Commonly known as:  TYLENOL Take 1,000 mg by mouth every 6 (six) hours as  needed for mild pain.   albuterol 108 (90 Base) MCG/ACT inhaler Commonly known as:  PROVENTIL HFA;VENTOLIN HFA Inhale 2 puffs into  the lungs every 6 (six) hours as needed for wheezing or shortness of breath.   aspirin 81 MG tablet Take 81 mg by mouth daily.   multivitamin with minerals Tabs tablet Take 1 tablet by mouth daily.   pantoprazole 40 MG tablet Commonly known as:  PROTONIX 40 mg one tab 30 minutes before breakfast meal   Tiotropium Bromide Monohydrate 1.25 MCG/ACT Aers Commonly known as:  SPIRIVA RESPIMAT Inhale 2 puffs into the lungs daily.   VITAMIN D3 PO Take 125 mcg by mouth daily.         Electronically Signed: Murrell Redden, PA-C 05/07/2018, 12:24 PM     I have spent Less Than 30 Minutes discharging Zachary Steele.

## 2018-06-29 ENCOUNTER — Other Ambulatory Visit: Payer: Self-pay | Admitting: Interventional Radiology

## 2018-06-29 DIAGNOSIS — C220 Liver cell carcinoma: Secondary | ICD-10-CM

## 2018-07-30 ENCOUNTER — Telehealth: Payer: Self-pay | Admitting: Cardiovascular Disease

## 2018-07-30 NOTE — Telephone Encounter (Signed)
LVM for patient to call to schedule 1 yr f/u with Dr Gwenlyn Found the week of 08/02/18-08/06/18

## 2018-08-23 NOTE — Progress Notes (Signed)
[place]   Telephone:([phone]) [phone] [phone]) [phone]   Clinic Follow up Note   Patient Care Team: [name], NP-C as PCP - General (Family Medicine) Milus Banister, MD as Attending Physician (Gastroenterology) Ilean China as Physician Assistant (Cardiology) Prescott Gum, Collier Salina, MD as Consulting Physician (Cardiothoracic Surgery) Greggory Keen, MD as Consulting Physician (Interventional Radiology)  Date of Service:  08/26/2018  CHIEF COMPLAINT: f/u [place]   SUMMARY OF ONCOLOGIC HISTORY: Oncology History   Cancer Staging Hepatocellular carcinoma [facility]) Staging form: Liver, AJCC 8th Edition - Clinical stage from 07/25/2017: Stage IIIA (cT3, cN0, cM0) - Signed by Truitt Merle, MD on 08/26/2017       Hepatocellular carcinoma ([place])   07/2017 Tumor Marker    AFP: 2431    07/15/2017 Imaging    IMPRESSION: ULTRASOUND ABDOMEN: Multiple hypoechoic liver masses, suspicious for multifocal hepatoma or hepatic metastatic disease. Abdomen MRI without and with contrast is recommended for further characterization. Cholelithiasis. No sonographic signs of acute cholecystitis or biliary dilatation. These results will be called to the ordering clinician or representative by the Radiologist Assistant, and communication documented in the PACS or zVision Dashboard.  ULTRASOUND HEPATIC ELASTOGRAPHY: Median hepatic shear wave velocity is calculated at 2.52 m/sec. Corresponding Metavir fibrosis score is Some F3 + F4. Risk of fibrosis is High. Follow-up: Follow up advised    07/25/2017 Imaging    IMPRESSION: At least 4 separate hepatic masses with characteristics of multifocal hepatoma, largest measuring 5.5 cm. No evidence of abdominal metastatic disease. Cholelithiasis.  No radiographic evidence of cholecystitis. Incidentally noted congenital small bowel malrotation. No evidence of volvulus or bowel obstruction.     07/25/2017 Cancer Staging    Staging form: Liver,  AJCC 8th Edition - Clinical stage from 07/25/2017: Stage IIIA (cT3, cN0, cM0) - Signed by Truitt Merle, MD on 08/26/2017    08/25/2017 Initial Diagnosis    Hepatocellular carcinoma ([place])    11/06/2017 Procedure    peripheral right hepatic artery Y 90 radio embolization    12/04/2017 Procedure    peripheral left hepatic Y 90 radio embolization    12/21/2017 Imaging    CT Chest W Contrast 12/21/17  IMPRESSION: 1. 4.8 x 5.9 x 6.7 cm heterogeneously enhancing hepatic mass compatible with the reported clinical history of hepatocellular carcinoma. No definite metastatic disease noted in the thorax, although there is a slightly prominent anterior mediastinal lymph node measuring 7 mm in short axis in the juxta pericardiac nodal station. This is nonspecific but warrants attention on follow-up studies. 2. Mild diffuse bronchial wall thickening with mild centrilobular and moderate paraseptal emphysema; imaging findings suggestive of underlying COPD. 3. In addition, there is evidence of probable interstitial lung disease in the lung bases. Outpatient referral to Pulmonology for further evaluation could be considered if clinically appropriate. 4. Aortic atherosclerosis, in addition to left main and 3 vessel coronary artery disease. Status post median sternotomy for CABG including LIMA to the LAD. Aortic Atherosclerosis (ICD10-I70.0) and Emphysema (ICD10-J43.9).    04/20/2018 Imaging    MRI Abdomen  IMPRESSION: 1. Mixed response to therapy. Dominant segment 4A left liver lobe tumor is decreased in size but demonstrates persistent thick peripheral enhancement indicating viable tumor. Inferior right liver lobe 3.0 cm mass is increased in size and demonstrates persistent avid arterial enhancement indicating viable tumor. Two additional smaller liver masses have decreased in size. One new subcentimeter segment 3 left liver lobe mass suspicious for new site of hepatocellular carcinoma. 2. Hepatic  cirrhosis.  Normal size spleen.  No ascites. 3. No abdominal lymphadenopathy. 4. Cholelithiasis.    05/06/2018 Procedure    05/06/18 right hepatic bland embolization by Dr. Annamaria Boots       CURRENT THERAPY:  Observation  INTERVAL HISTORY:  Zachary Steele is here for a follow up of liver cancer. He presents to the clinic today by himself. He notes his latest embolization in January went well. He has not followed up with Dr Annamaria Boots since then. He notes currently he feels well with no pain, adequate BMs, adequate energy and appetite.  He notes he is trying to quit smoking and he has a cough. He notes he is down to 3 cigarettes a day. 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 He notes he will have to file for unemployment.    REVIEW OF SYSTEMS:   Constitutional: Denies fevers, chills or abnormal weight loss Eyes: Denies blurriness of vision Ears, nose, mouth, throat, and face: Denies mucositis or sore throat Respiratory: Denies dyspnea or wheezes (+) cough  Cardiovascular: Denies palpitation, chest discomfort or lower extremity swelling Gastrointestinal:  Denies nausea, heartburn or change in bowel habits Skin: Denies abnormal skin rashes Lymphatics: Denies new lymphadenopathy or easy bruising Neurological:Denies numbness, tingling or new weaknesses Behavioral/Psych: Mood is stable, no new changes  All other systems were reviewed with the patient and are negative.  MEDICAL HISTORY:  Past Medical History:  Diagnosis Date  . Alcohol dependence ([place])   . Anemia    denies  . Arthritis   . Cataract   . Cirrhosis ([place])   . Coronary artery disease due to lipid rich plaque   . Depression   . Hepatitis C    Has A and B also  . PAD (peripheral artery disease) ([place])    pt reports he has this and has chronic leg "soreness"  . Peripheral vascular disease ([place])    essentially normal circulations with two-vessel  runoff to the feet  bilaterally  . Tobacco abuse     SURGICAL HISTORY: Past Surgical History:  Procedure Laterality Date  . CARDIAC CATHETERIZATION    . COLONOSCOPY    . CORONARY ARTERY BYPASS GRAFT  2010   Triple bypass at [facility]  . DOPPLER ECHOCARDIOGRAPHY  2011  . IR ANGIOGRAM SELECTIVE EACH ADDITIONAL VESSEL  10/27/2017  . IR ANGIOGRAM SELECTIVE EACH ADDITIONAL VESSEL  10/27/2017  . IR ANGIOGRAM SELECTIVE EACH ADDITIONAL VESSEL  10/27/2017  . IR ANGIOGRAM SELECTIVE EACH ADDITIONAL VESSEL  10/27/2017  . IR ANGIOGRAM SELECTIVE EACH ADDITIONAL VESSEL  10/27/2017  . IR ANGIOGRAM SELECTIVE EACH ADDITIONAL VESSEL  10/27/2017  . IR ANGIOGRAM SELECTIVE EACH ADDITIONAL VESSEL  10/27/2017  . IR ANGIOGRAM SELECTIVE EACH ADDITIONAL VESSEL  11/06/2017  . IR ANGIOGRAM SELECTIVE EACH ADDITIONAL VESSEL  11/06/2017  . IR ANGIOGRAM SELECTIVE EACH ADDITIONAL VESSEL  11/06/2017  . IR ANGIOGRAM SELECTIVE EACH ADDITIONAL VESSEL  12/04/2017  . IR ANGIOGRAM SELECTIVE EACH ADDITIONAL VESSEL  12/04/2017  . IR ANGIOGRAM SELECTIVE EACH ADDITIONAL VESSEL  05/06/2018  . IR ANGIOGRAM SELECTIVE EACH ADDITIONAL VESSEL  05/06/2018  . IR ANGIOGRAM SELECTIVE EACH ADDITIONAL VESSEL  05/06/2018  . IR ANGIOGRAM SELECTIVE EACH ADDITIONAL VESSEL  05/06/2018  . IR ANGIOGRAM VISCERAL SELECTIVE  10/27/2017  . IR ANGIOGRAM VISCERAL SELECTIVE  11/06/2017  . IR ANGIOGRAM VISCERAL SELECTIVE  12/04/2017  . IR ANGIOGRAM VISCERAL SELECTIVE  05/06/2018  . IR EMBO ARTERIAL NOT HEMORR HEMANG  INC GUIDE ROADMAPPING  10/27/2017  . IR EMBO TUMOR ORGAN ISCHEMIA INFARCT INC GUIDE ROADMAPPING  11/06/2017  . IR EMBO TUMOR ORGAN ISCHEMIA INFARCT INC GUIDE ROADMAPPING  12/04/2017  . IR EMBO TUMOR ORGAN ISCHEMIA INFARCT INC GUIDE ROADMAPPING  05/06/2018  . IR RADIOLOGIST EVAL & MGMT  08/12/2017  . IR RADIOLOGIST EVAL & MGMT  09/22/2017  . IR RADIOLOGIST EVAL & MGMT  01/07/2018  . IR RADIOLOGIST EVAL & MGMT  04/20/2018  . IR US GUIDE VASC ACCESS RIGHT  10/27/2017  . IR US GUIDE VASC  ACCESS RIGHT  11/06/2017  . IR US GUIDE VASC ACCESS RIGHT  12/04/2017  . IR US GUIDE VASC ACCESS RIGHT  05/06/2018  . NM MYOVIEW LTD  2012    I have reviewed the social history and family history with the patient and they are unchanged from previous note.  ALLERGIES:  is allergic to statins.  MEDICATIONS:  Current Outpatient Medications  Medication Sig Dispense Refill  . acetaminophen (TYLENOL) 500 MG tablet Take 1,000 mg by mouth every 6 (six) hours as needed for mild pain.     Marland Kitchen albuterol (PROVENTIL HFA;VENTOLIN HFA) 108 (90 Base) MCG/ACT inhaler Inhale 2 puffs into the lungs every 6 (six) hours as needed for wheezing or shortness of breath. 1 Inhaler 6  . aspirin 81 MG tablet Take 81 mg by mouth daily.    . Cholecalciferol (VITAMIN D3 PO) Take 125 mcg by mouth daily.    . Multiple Vitamin (MULTIVITAMIN WITH MINERALS) TABS tablet Take 1 tablet by mouth daily.    . pantoprazole (PROTONIX) 40 MG tablet 40 mg one tab 30 minutes before breakfast meal 90 tablet 3  . Tiotropium Bromide Monohydrate (SPIRIVA RESPIMAT) 1.25 MCG/ACT AERS Inhale 2 puffs into the lungs daily. 4 g 2   No current facility-administered medications for this visit.     PHYSICAL EXAMINATION: ECOG PERFORMANCE STATUS: 0 - Asymptomatic  Vitals:   08/26/18 1136  BP: (!) 145/79  Pulse: 60  Resp: 18  Temp: 99.2 F (37.3 C)  SpO2: 95%   Filed Weights   08/26/18 1136  Weight: 143 lb 4.8 oz (65 kg)    GENERAL:alert, no distress and comfortable SKIN: skin color, texture, turgor are normal, no rashes or significant lesions EYES: normal, Conjunctiva are pink and non-injected, sclera clear OROPHARYNX:no exudate, no erythema and lips, buccal mucosa, and tongue normal  NECK: supple, thyroid normal size, non-tender, without nodularity LYMPH:  no palpable lymphadenopathy in the cervical, axillary or inguinal LUNGS: clear to auscultation and percussion with normal breathing effort HEART: regular rate & rhythm and no  murmurs and no lower extremity edema ABDOMEN:abdomen soft, non-tender and normal bowel sounds Musculoskeletal:no cyanosis of digits and no clubbing  NEURO: alert & oriented x 3 with fluent speech, no focal motor/sensory deficits  LABORATORY DATA:  I have reviewed the data as listed CBC Latest Ref Rng & Units 08/26/2018 05/06/2018 02/25/2018  WBC 4.0 - 10.5 K/uL 4.5 4.0 4.1  Hemoglobin 13.0 - 17.0 g/dL 15.2 13.1 12.6(L)  Hematocrit 39.0 - 52.0 % 44.7 40.9 38.0(L)  Platelets 150 - 400 K/uL 106(L) 103(L) 120(L)     CMP Latest Ref Rng & Units 08/26/2018 05/06/2018 02/25/2018  Glucose 70 - 99 mg/dL 79 89 116(H)  BUN 8 - 23 mg/dL 6(L) 7(L) 6(L)  Creatinine 0.61 - 1.24 mg/dL 0.90 0.79 0.88  Sodium 135 - 145 mmol/L 139 138 143  Potassium 3.5 - 5.1 mmol/L 3.9 3.7 4.0  Chloride 98 -  111 mmol/L 106 106 106  CO2 22 - 32 mmol/L '24 23 28  '$ Calcium 8.9 - 10.3 mg/dL 9.6 9.4 9.6  Total Protein 6.5 - 8.1 g/dL 8.6(H) 8.0 8.3(H)  Total Bilirubin 0.3 - 1.2 mg/dL 0.7 0.9 0.6  Alkaline Phos 38 - 126 U/L 125 107 128(H)  AST 15 - 41 U/L 45(H) 42(H) 36  ALT 0 - 44 U/L '28 22 18      '$ RADIOGRAPHIC STUDIES: I have personally reviewed the radiological images as listed and agreed with the findings in the report. No results found.   ASSESSMENT & PLAN:  Zachary Steele is a 68 y.o. male with   1. Hepatocellular Carcinoma, multifocal, cT3N0Mx -he was diagnosed in 08/2017 given he has at least 4 separate hepatic masses with the largest measuring 5.5 cm with typical imaging features of HCC. AFP is elevated to 2431;this is diagnostic for hepatocellular carcinoma.  -Due to the size and multifocal disease he is not a candidate for liver transplant or resection.His liver cancer is not curable at this stage. -He underwent bilateral hepatic artery Y90 treatments on 11/06/17 and 12/04/17 with Dr. Annamaria Boots. He tolerated the procedure very well.    -I discussed his MRI from 04/2018 showed mixed response to Y90, he has  residual treated disease and new liver lesions.  -On 05/06/18 he underwent right hepatic bland embolization by Dr. Annamaria Boots  -Given his disease progression after multiple embolization, I think he would benefit from systemic therapy soon  -I discussed option of oral TKIs such as sorafenib or lenvatinib, and immunotherapy such as PD-L1 or PD-1 inhibitors, to further control his cancer. I reviewed risk and benefits of all options. Based on the results from the phase III RCT sorafenib vs nivolumab show very similar overall survival (16.79mnivo, 14.7 sorafenib) with similar median PFS, but fewer severe AE with nivolumab, I would recommend nivo every 4 weeks as first line. I will discussed his options with Dr SAnnamaria Bootsand next scan in 1-2 months.  -If he has significant disease progression and there is no further target therapy offered, I will likely start him on IV immunotherapy. -Labs reviewed, CBC WNL except PLT 106K, CMP WNL except AST 45, AFP is still pending. AFP has been trending down.  -f/u in 3 months    2. Cirrhosis, Child-Pugh class A, Chronic Hepatitis C -He is followed by PCP and [facility].  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 Plan to repeat Endoscopy in 11/2019 to monitor Varices -No sign of ascites; will monitor  -We recommend he continue to abstain from alcohol to prevent further liver damage and try to quit smoking   3. COPD, Smoking Cessation, Cough  -He has some intermittent SOB, likely related to his smoking.  -His PCP started him on inhaler as needed for SOB. He has developed a cough  -He has reduced down to 3 cigarettes a day, I encouraged him to complete cessation.   PLAN: -He is clinically doing well, will continue  observation for now  -I will contact Dr SAnnamaria Bootsabout his treatment options and next scan, if his next scan shows further disease progression, will start him on Nivo every 4 weeks  -Lab and f/u in 3 months    No problem-specific Assessment &  Plan notes found for this encounter.   No orders of the defined types were placed in this encounter.  All questions were answered. The patient knows to call the clinic with any problems, questions or concerns. No barriers to learning was detected. I spent 20 minutes counseling the patient face to face. The total time spent in the appointment was 25 minutes and more than 50% was on counseling and review of test results   I, Joslyn Devon, am acting as scribe for Truitt Merle, MD.   I have reviewed the above documentation for accuracy and completeness, and I agree with the above.    Truitt Merle, MD 08/26/2018

## 2018-08-26 ENCOUNTER — Other Ambulatory Visit: Payer: Self-pay

## 2018-08-26 ENCOUNTER — Inpatient Hospital Stay (HOSPITAL_BASED_OUTPATIENT_CLINIC_OR_DEPARTMENT_OTHER): Payer: Medicare HMO | Admitting: Hematology

## 2018-08-26 ENCOUNTER — Telehealth: Payer: Self-pay | Admitting: Hematology

## 2018-08-26 ENCOUNTER — Inpatient Hospital Stay: Payer: Medicare HMO | Attending: Hematology

## 2018-08-26 ENCOUNTER — Encounter: Payer: Self-pay | Admitting: Hematology

## 2018-08-26 VITALS — BP 145/79 | HR 60 | Temp 99.2°F | Resp 18 | Ht 67.0 in | Wt 143.3 lb

## 2018-08-26 DIAGNOSIS — B182 Chronic viral hepatitis C: Secondary | ICD-10-CM | POA: Diagnosis not present

## 2018-08-26 DIAGNOSIS — R05 Cough: Secondary | ICD-10-CM | POA: Insufficient documentation

## 2018-08-26 DIAGNOSIS — C220 Liver cell carcinoma: Secondary | ICD-10-CM | POA: Diagnosis not present

## 2018-08-26 DIAGNOSIS — Z720 Tobacco use: Secondary | ICD-10-CM | POA: Insufficient documentation

## 2018-08-26 DIAGNOSIS — J449 Chronic obstructive pulmonary disease, unspecified: Secondary | ICD-10-CM | POA: Diagnosis not present

## 2018-08-26 DIAGNOSIS — R0602 Shortness of breath: Secondary | ICD-10-CM

## 2018-08-26 LAB — CMP (CANCER CENTER ONLY)
ALT: 28 U/L (ref 0–44)
AST: 45 U/L — ABNORMAL HIGH (ref 15–41)
Albumin: 3.8 g/dL (ref 3.5–5.0)
Alkaline Phosphatase: 125 U/L (ref 38–126)
Anion gap: 9 (ref 5–15)
BUN: 6 mg/dL — ABNORMAL LOW (ref 8–23)
CO2: 24 mmol/L (ref 22–32)
Calcium: 9.6 mg/dL (ref 8.9–10.3)
Chloride: 106 mmol/L (ref 98–111)
Creatinine: 0.9 mg/dL (ref 0.61–1.24)
GFR, Est AFR Am: >60 mL/min (ref >60)
GFR, Estimated: >60 mL/min (ref >60)
Glucose, Bld: 79 mg/dL (ref 70–99)
Potassium: 3.9 mmol/L (ref 3.5–5.1)
Sodium: 139 mmol/L (ref 135–145)
Total Bilirubin: 0.7 mg/dL (ref 0.3–1.2)
Total Protein: 8.6 g/dL — ABNORMAL HIGH (ref 6.5–8.1)

## 2018-08-26 LAB — CBC WITH DIFFERENTIAL (CANCER CENTER ONLY)
Abs Immature Granulocytes: 0 10*3/uL (ref 0.00–0.07)
Basophils Absolute: 0.1 10*3/uL (ref 0.0–0.1)
Basophils Relative: 1 %
Eosinophils Absolute: 0.3 10*3/uL (ref 0.0–0.5)
Eosinophils Relative: 7 %
HCT: 44.7 % (ref 39.0–52.0)
Hemoglobin: 15.2 g/dL (ref 13.0–17.0)
Immature Granulocytes: 0 %
Lymphocytes Relative: 40 %
Lymphs Abs: 1.8 10*3/uL (ref 0.7–4.0)
MCH: 32.2 pg (ref 26.0–34.0)
MCHC: 34 g/dL (ref 30.0–36.0)
MCV: 94.7 fL (ref 80.0–100.0)
Monocytes Absolute: 0.5 10*3/uL (ref 0.1–1.0)
Monocytes Relative: 11 %
Neutro Abs: 1.9 10*3/uL (ref 1.7–7.7)
Neutrophils Relative %: 41 %
Platelet Count: 106 10*3/uL — ABNORMAL LOW (ref 150–400)
RBC: 4.72 MIL/uL (ref 4.22–5.81)
RDW: 14.1 % (ref 11.5–15.5)
WBC Count: 4.5 10*3/uL (ref 4.0–10.5)
nRBC: 0 % (ref 0.0–0.2)

## 2018-08-26 NOTE — Telephone Encounter (Signed)
Scheduled appt per 5/14 los. ° °A calendar will be mailed out. °

## 2018-08-27 ENCOUNTER — Other Ambulatory Visit: Payer: Self-pay | Admitting: Hematology

## 2018-08-27 DIAGNOSIS — C220 Liver cell carcinoma: Secondary | ICD-10-CM

## 2018-08-27 LAB — AFP TUMOR MARKER: AFP, Serum, Tumor Marker: 44.7 ng/mL — ABNORMAL HIGH (ref 0.0–8.3)

## 2018-08-31 ENCOUNTER — Telehealth: Payer: Self-pay | Admitting: *Deleted

## 2018-08-31 NOTE — Telephone Encounter (Signed)
Left message to call back regarding lab results.

## 2018-08-31 NOTE — Telephone Encounter (Signed)
-----   Message from Alla Feeling, NP sent at 08/31/2018 12:00 PM EDT ----- Please let him know AFP is stable. Thanks, Regan Rakers NP

## 2018-09-01 ENCOUNTER — Telehealth: Payer: Self-pay

## 2018-09-01 NOTE — Telephone Encounter (Signed)
-----   Message from Alla Feeling, NP sent at 08/31/2018 12:00 PM EDT ----- Please let him know AFP is stable. Thanks, Regan Rakers NP

## 2018-09-01 NOTE — Telephone Encounter (Signed)
Spoke with patient regarding lab results, per Cira Rue NP tumor marker is stable. Patient verbalized an understanding.

## 2018-09-11 ENCOUNTER — Other Ambulatory Visit: Payer: Medicare HMO

## 2018-09-28 ENCOUNTER — Telehealth: Payer: Self-pay | Admitting: Family Medicine

## 2018-09-28 ENCOUNTER — Other Ambulatory Visit: Payer: Self-pay

## 2018-09-28 ENCOUNTER — Ambulatory Visit
Admission: RE | Admit: 2018-09-28 | Discharge: 2018-09-28 | Disposition: A | Discharge status: Home / Self Care | Payer: 59 | Source: Ambulatory Visit | Attending: Hematology | Admitting: Hematology

## 2018-09-28 DIAGNOSIS — C220 Liver cell carcinoma: Secondary | ICD-10-CM

## 2018-09-28 NOTE — Telephone Encounter (Signed)
I recommend that he contact his pulmonologist to see if they have recommendations since he had pulmonary function tests fairly recently (December). If he cannot get in to see them, we can do a virtual visit to discuss this further.

## 2018-09-28 NOTE — Telephone Encounter (Signed)
Please call Pt went for MRI today and was doing fine until he said after they gave him the IV, he could not breathe and had to get out of machine He said it is not related to the IV, he said he has this breathing problem at other times  They have him card to reschedule MRI but he states he needs to see what is going on with his breathing

## 2018-09-29 NOTE — Telephone Encounter (Signed)
Pt was notified to contact pulmonology. And call us back if he needed to do a virtual visit with [place]

## 2018-10-04 ENCOUNTER — Encounter: Payer: Self-pay | Admitting: Primary Care

## 2018-10-04 ENCOUNTER — Ambulatory Visit (INDEPENDENT_AMBULATORY_CARE_PROVIDER_SITE_OTHER): Payer: Medicare HMO | Admitting: Primary Care

## 2018-10-04 ENCOUNTER — Ambulatory Visit (INDEPENDENT_AMBULATORY_CARE_PROVIDER_SITE_OTHER): Payer: Medicare HMO

## 2018-10-04 ENCOUNTER — Other Ambulatory Visit: Payer: Self-pay

## 2018-10-04 VITALS — BP 160/74 | HR 70 | Temp 97.9°F | Ht 67.0 in | Wt 147.0 lb

## 2018-10-04 DIAGNOSIS — F17200 Nicotine dependence, unspecified, uncomplicated: Secondary | ICD-10-CM

## 2018-10-04 DIAGNOSIS — M79642 Pain in left hand: Secondary | ICD-10-CM

## 2018-10-04 DIAGNOSIS — M79641 Pain in right hand: Secondary | ICD-10-CM | POA: Diagnosis not present

## 2018-10-04 DIAGNOSIS — J849 Interstitial pulmonary disease, unspecified: Secondary | ICD-10-CM | POA: Diagnosis not present

## 2018-10-04 DIAGNOSIS — F411 Generalized anxiety disorder: Secondary | ICD-10-CM | POA: Diagnosis not present

## 2018-10-04 DIAGNOSIS — R0602 Shortness of breath: Secondary | ICD-10-CM

## 2018-10-04 DIAGNOSIS — R142 Eructation: Secondary | ICD-10-CM

## 2018-10-04 DIAGNOSIS — J8410 Pulmonary fibrosis, unspecified: Secondary | ICD-10-CM

## 2018-10-04 MED ORDER — STIOLTO RESPIMAT 2.5-2.5 MCG/ACT IN AERS: 2.0000 | INHALATION_SPRAY | Freq: Every day | RESPIRATORY_TRACT | 0 refills | Status: DC

## 2018-10-04 MED ORDER — PANTOPRAZOLE SODIUM 40 MG PO TBEC: DELAYED_RELEASE_TABLET | ORAL | 3 refills | Status: AC

## 2018-10-04 NOTE — Assessment & Plan Note (Signed)
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  - Referred to [facility] rheumatology but never completed - Re-refer to rheumatology re: ILD, positive serology

## 2018-10-04 NOTE — Assessment & Plan Note (Signed)
-   Mild obstructive on PFTs  - Increased shortness of breath with talking and at night  - Stop Spiriva.Trial Stiolto 2 puffs once daily (LABA/LAMA) - Use albuterol hfa 2 puffs every 4-6 hours prn breakthrough shortness of breath/wheezing

## 2018-10-04 NOTE — Assessment & Plan Note (Signed)
-   Complains of arthritis pain in bilateral hands and difficulty tying bags - Refer rheumatology

## 2018-10-04 NOTE — Assessment & Plan Note (Signed)
-   Strongly advised patient to quit smoking all together, support given

## 2018-10-04 NOTE — Progress Notes (Signed)
@Patient  ID: Zachary Steele, male    DOB: 1951-01-01, 68 y.o.   MRN: [id number]  Chief Complaint  Patient presents with  . Follow-up    SOB, better with belch, cough    Referring provider: [name], NP-C  HPI: 68 year old male, current some day smoker. PMH significant ILD, COPD, ETOH, hep C, cirrhosis, hepatocellular carcinoma, emphysema. Patient of Dr. Halford Chessman, seen for initial consult on 02/15/18. Quit drinking alcohol in Jan 2019. Complains of intermittent cough with clear sputum, dyspnea when walking up hills. Works with [employer]. No on any inhaler medication currently. Plan to check A1AT level and PFTs, Given RX for proair as needed. Encouraged smoking cessation. Received influenza vaccine on 02/25/18.   04/08/2018 Patient presents today for 6 week follow-up with PFTs.  Patient states that he still gets short of breath with walking. He has been carrying his rescue inhaler with him and uses it once and a while. Has not noticed much difference with SABA.  Continues to have a dry hacking cough.  He is smoking no more than 2 cigarettes a day. Describes some intermittent lightheadedness and dizziness with activity that lasts less than a minute.  Reports it is similar feeling as a tobacco high. He states that belching makes this go away. Following with [facility] for liver cirrhosis and hep c.  Orthostatic blood pressure check today negative. Ambulatory O2 sat with no desaturation or reproducible lightheadedness.  Denies chest pain, wheezing, productive cough, hemoptysis. Started on Spiriva. Recommend FU in 4-6 weeks.  10/04/2018 Patient presents today for follow-up. Experienced shortness of breath during a recent MRI with IV contrast for cirrhosis/GI follow-up. States that he had to get out of the machine. Does not think it was a reaction to dye. He has experienced shortness of breath other times such as talking and at night. Symptoms typically resolve with rest/sitting. Belching also  relieves perceived shortness of breath. He is not taking protonix. Still smoking <1 cigarette a day, has not quit. Daily cough with some production described as light yellow/cream.  Pulmonary function testing in December showed mild obstructive lung disease with decreased DLCO. 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 Rheumatoid factor elevated and ANCA positive. Referred to [facility] rheumatology. Per chart review on 05/04/18 referral was declined by [facility] rheumatology. Had an apt 05/24/18 with medical associated which he no showed. I do not think he was aware of this appointment. Has arthritis in his hands. Issues tying bags.   Pulmonary function test: 04/08/2018 - FVC 3.35 (93%), FEV1 2.31 (84%), ratio 69, DLCOcor 45%, no BD response. Curvature to flow volume loop   Imaging: 12/21/17 CT Chest w/ contrast - Diffuse bronchial wall thickening with mild centrilobular and moderate paraseptal emphysema most notably in the lung apices. Areas of ground-glass attenuation, septal thickening, subpleural reticulation, mild cylindrical bronchiectasis and peripheral bronchiolectasis are noted in the lung bases bilaterally, most severe in the left lower lobe. Calcified granuloma in the inferior segment of the lingula.  Labs: Alpha-1 antitrypsin- 201 (H) Rheumatoid factor- 145 (H) ANCA- positive    Allergies  Allergen Reactions  . Statins Other (See Comments)    Alcoholic cirrhosis, cardiology has declined to start statin    Immunization History  Administered Date(s) Administered  . Influenza, High Dose Seasonal PF 02/15/2018  . Pneumococcal Polysaccharide-23 09/19/2011    Past Medical History:  Diagnosis Date  . Alcohol dependence ([place])   . Anemia    denies  .  Arthritis   . Cataract   . Cirrhosis ([place])   . Coronary artery disease due to lipid rich plaque   . Depression   . Hepatitis C    Has A and B also  . PAD (peripheral artery disease) ([place])     pt reports he has this and has chronic leg "soreness"  . Peripheral vascular disease ([place])    essentially normal circulations with two-vessel  runoff to the feet bilaterally  . Tobacco abuse     Tobacco History: Social History   Tobacco Use  Smoking Status Current Some Day Smoker  . Packs/day: 0.10  . Years: 52.00  . Pack years: 5.20  . Types: Cigarettes  Smokeless Tobacco Never Used  Tobacco Comment   Is trying to stop smoking, still has one or two per day most days   Ready to quit: Not Answered Counseling given: Not Answered Comment: Is trying to stop smoking, still has one or two per day most days   Outpatient Medications Prior to Visit  Medication Sig Dispense Refill  . acetaminophen (TYLENOL) 500 MG tablet Take 1,000 mg by mouth every 6 (six) hours as needed for mild pain.     Marland Kitchen albuterol (PROVENTIL HFA;VENTOLIN HFA) 108 (90 Base) MCG/ACT inhaler Inhale 2 puffs into the lungs every 6 (six) hours as needed for wheezing or shortness of breath. 1 Inhaler 6  . aspirin 81 MG tablet Take 81 mg by mouth daily.    . Cholecalciferol (VITAMIN D3 PO) Take 125 mcg by mouth daily.    . Multiple Vitamin (MULTIVITAMIN WITH MINERALS) TABS tablet Take 1 tablet by mouth daily.    . Tiotropium Bromide Monohydrate (SPIRIVA RESPIMAT) 1.25 MCG/ACT AERS Inhale 2 puffs into the lungs daily. 4 g 2  . pantoprazole (PROTONIX) 40 MG tablet 40 mg one tab 30 minutes before breakfast meal (Patient not taking: Reported on 10/04/2018) 90 tablet 3   No facility-administered medications prior to visit.    Review of Systems  Review of Systems  Constitutional: Negative.   Respiratory: Positive for cough and shortness of breath.   Cardiovascular: Negative.   Gastrointestinal:       Gas/belching   Neurological: Positive for light-headedness.   Physical Exam  BP (!) 160/74 (BP Location: Right Arm, Cuff Size: Normal)   Pulse 70   Temp 97.9 F (36.6 C)   Ht 5\' 7"  (1.702 m)   Wt 147 lb (66.7 kg)    SpO2 99%   BMI 23.02 kg/m  Physical Exam Constitutional:      General: He is not in acute distress.    Appearance: Normal appearance.  HENT:     Head: Normocephalic and atraumatic.     Nose: Nose normal.  Pulmonary:     Effort: Pulmonary effort is normal.     Breath sounds: No wheezing.     Comments: Velcro crackles to bilateral lung bases Abdominal:     General: There is distension.  Musculoskeletal: Normal range of motion.  Skin:    General: Skin is warm and dry.  Neurological:     General: No focal deficit present.     Mental Status: He is alert. Mental status is at baseline.  Psychiatric:        Mood and Affect: Mood normal.        Behavior: Behavior normal.        Thought Content: Thought content normal.        Judgment: Judgment normal.     Lab Results:  CBC    Component Value Date/Time   WBC 4.5 08/26/2018 1115   WBC 4.0 05/06/2018 1213   RBC 4.72 08/26/2018 1115   HGB 15.2 08/26/2018 1115   HGB 12.9 (L) 05/19/2017 1010   HGB 12.9 (L) 11/19/2009 1342   HCT 44.7 08/26/2018 1115   HCT 37.5 05/19/2017 1010   HCT 36.6 (L) 11/19/2009 1342   PLT 106 (L) 08/26/2018 1115   PLT 90 (LL) 05/19/2017 1010   MCV 94.7 08/26/2018 1115   MCV 104 (H) 05/19/2017 1010   MCV 111.5 (H) 11/19/2009 1342   MCH 32.2 08/26/2018 1115   MCHC 34.0 08/26/2018 1115   RDW 14.1 08/26/2018 1115   RDW 13.5 05/19/2017 1010   RDW 13.1 11/19/2009 1342   LYMPHSABS 1.8 08/26/2018 1115   LYMPHSABS 1.7 05/19/2017 1010   LYMPHSABS 1.6 11/19/2009 1342   MONOABS 0.5 08/26/2018 1115   MONOABS 0.6 11/19/2009 1342   EOSABS 0.3 08/26/2018 1115   EOSABS 0.3 05/19/2017 1010   BASOSABS 0.1 08/26/2018 1115   BASOSABS 0.1 05/19/2017 1010   BASOSABS 0.0 11/19/2009 1342    BMET    Component Value Date/Time   NA 139 08/26/2018 1115   NA 140 05/19/2017 1010   K 3.9 08/26/2018 1115   CL 106 08/26/2018 1115   CO2 24 08/26/2018 1115   GLUCOSE 79 08/26/2018 1115   BUN 6 (L) 08/26/2018 1115   BUN 5  (L) 05/19/2017 1010   CREATININE 0.90 08/26/2018 1115   CALCIUM 9.6 08/26/2018 1115   GFRNONAA >60 08/26/2018 1115   GFRAA >60 08/26/2018 1115    BNP No results found for: BNP  ProBNP    Component Value Date/Time   PROBNP 78.0 04/28/2008 1637    Imaging: Dg Chest 2 View  Result Date: 10/04/2018 CLINICAL DATA:  Shortness of breath. EXAM: CHEST - 2 VIEW COMPARISON:  Chest CT 12/21/2017 FINDINGS: Stable surgical changes from coronary artery bypass surgery. The heart is normal in size. The mediastinal and hilar contours appear normal and stable. There is mild tortuosity and calcification of the thoracic aorta. Underlying emphysematous changes and left basilar scarring. There is also a calcified granuloma in the lingula. No infiltrates, edema or effusions. No worrisome pulmonary lesions. The bony thorax is intact. IMPRESSION: Chronic emphysematous changes and pulmonary scarring but no definite acute overlying pulmonary process. Electronically Signed   By: Marijo Sanes M.D.   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  - Referred to [facility]ology but never completed - Re-refer to rheumatology re: ILD, positive serology   Bilateral hand pain - Complains of arthritis pain in bilateral hands and difficulty tying bags - Refer rheumatology   Smoker - Strongly advised patient to quit smoking all together, support given   Anxiety reaction - Patient describes what sounds like a panic attack during MRI - Advised patient to  discuss anxiety medication prior to repeat scan such as low dose valium or lorazepam (pmp reviewed, overdose risk score zero with no unexpected prescriptions)  Belching symptom - Refill Protonix 40mg   daily - Follow with GI   Martyn Ehrich, NP 10/04/2018

## 2018-10-04 NOTE — Patient Instructions (Addendum)
   Recommend you discuss taking an anxiety medication (such as valium or ativan) prior to MRI with your PCP   Use Albuterol rescue inhaler 15-20 mins prior to MRI  TRIAL Stiolto 2 puffs twice daily (this replaced Spiriva)  Continue Albuterol (ventolin) 2 puffs every 6 hours as needed for shortness of breath/wheezing  Restart Protonix 40mg  daily- take 30 mins before breakfast  Re-refer back to rheumatology for lung fibrosis and positive rheumatoid factor   Follow up in 2-3 months with Dr. Halford Chessman or Eustaquio Maize NP

## 2018-10-04 NOTE — Assessment & Plan Note (Signed)
-   Refill Protonix 40mg  daily - Follow with GI

## 2018-10-04 NOTE — Assessment & Plan Note (Signed)
-   Patient describes what sounds like a panic attack during MRI - Advised patient to discuss anxiety medication prior to repeat scan such as low dose valium or lorazepam (pmp reviewed, overdose risk score zero with no unexpected prescriptions)

## 2018-10-06 ENCOUNTER — Telehealth: Payer: Self-pay | Admitting: Family Medicine

## 2018-10-06 NOTE — Telephone Encounter (Signed)
Left message for pt to call me back to let me know when MRI is scheduled

## 2018-10-06 NOTE — Telephone Encounter (Signed)
Please see recommendations of Provider Walsh appt on 10/04/2018. Pt can be reached at [phone]

## 2018-10-06 NOTE — Telephone Encounter (Signed)
Please call and find out when his MRI is scheduled. I am willing to send in a sedative so that he can have the study done.

## 2018-10-07 ENCOUNTER — Other Ambulatory Visit: Payer: Self-pay | Admitting: Hematology

## 2018-10-07 DIAGNOSIS — C220 Liver cell carcinoma: Secondary | ICD-10-CM

## 2018-10-07 NOTE — Telephone Encounter (Signed)
Pt states that he will let us know when his MRI is scheduled he is waiting for a time. Once scheduled he will give [place] a call back so vickie can send in the medicine

## 2018-10-11 NOTE — Telephone Encounter (Signed)
Pt called to inform us that his MRI is scheduled for 11/04/18 at 10:00am. He stated he was asked to call and let us know his medication can be sent to his pharmacy. Please advise.

## 2018-10-12 ENCOUNTER — Other Ambulatory Visit: Payer: Self-pay | Admitting: Family Medicine

## 2018-10-12 DIAGNOSIS — Z8659 Personal history of other mental and behavioral disorders: Secondary | ICD-10-CM

## 2018-10-12 DIAGNOSIS — J849 Interstitial pulmonary disease, unspecified: Secondary | ICD-10-CM | POA: Diagnosis not present

## 2018-10-12 DIAGNOSIS — M19042 Primary osteoarthritis, left hand: Secondary | ICD-10-CM | POA: Diagnosis not present

## 2018-10-12 DIAGNOSIS — M19041 Primary osteoarthritis, right hand: Secondary | ICD-10-CM | POA: Diagnosis not present

## 2018-10-12 DIAGNOSIS — M79641 Pain in right hand: Secondary | ICD-10-CM | POA: Diagnosis not present

## 2018-10-12 DIAGNOSIS — B1920 Unspecified viral hepatitis C without hepatic coma: Secondary | ICD-10-CM | POA: Diagnosis not present

## 2018-10-12 DIAGNOSIS — F40240 Claustrophobia: Secondary | ICD-10-CM

## 2018-10-12 DIAGNOSIS — M064 Inflammatory polyarthropathy: Secondary | ICD-10-CM | POA: Diagnosis not present

## 2018-10-12 DIAGNOSIS — M79642 Pain in left hand: Secondary | ICD-10-CM | POA: Diagnosis not present

## 2018-10-12 DIAGNOSIS — R768 Other specified abnormal immunological findings in serum: Secondary | ICD-10-CM | POA: Diagnosis not present

## 2018-10-12 MED ORDER — LORAZEPAM 0.5 MG PO TABS: ORAL_TABLET | ORAL | 0 refills | Status: DC

## 2018-10-12 NOTE — Telephone Encounter (Signed)
You can let him know that I will send this to his pharmacy. He should take it 30-40 minutes before his MRI. He will need someone to drive him to his appt after taking tihs since this medication can be sedating.

## 2018-10-12 NOTE — Telephone Encounter (Signed)
Pt was notified.  

## 2018-11-04 ENCOUNTER — Other Ambulatory Visit: Payer: Medicare HMO

## 2018-11-04 ENCOUNTER — Other Ambulatory Visit: Payer: Self-pay

## 2018-11-04 ENCOUNTER — Ambulatory Visit
Admission: RE | Admit: 2018-11-04 | Discharge: 2018-11-04 | Disposition: A | Discharge status: Home / Self Care | Payer: Medicare HMO | Source: Ambulatory Visit | Attending: Hematology | Admitting: Hematology

## 2018-11-04 DIAGNOSIS — K8020 Calculus of gallbladder without cholecystitis without obstruction: Secondary | ICD-10-CM | POA: Diagnosis not present

## 2018-11-04 DIAGNOSIS — C220 Liver cell carcinoma: Secondary | ICD-10-CM | POA: Diagnosis not present

## 2018-11-04 MED ORDER — GADOBENATE DIMEGLUMINE 529 MG/ML IV SOLN
13.0000 mL | Freq: Once | INTRAVENOUS | Status: AC | PRN
  Administered 2018-11-04: 13 mL via INTRAVENOUS

## 2018-11-05 ENCOUNTER — Telehealth: Payer: Self-pay

## 2018-11-05 NOTE — Telephone Encounter (Signed)
Spoke with pt in regards to note below:  Notes recorded by Truitt Merle, MD on 11/05/2018 at 11:19 AM EDT  Please let pt know his MRI results, overall stable, I have talked to Dr. Annamaria Boots and we recommend continue observation for now. I will see him next month and order next scan on his next visit. Thanks   Pt verbalized understanding

## 2018-11-05 NOTE — Telephone Encounter (Signed)
-----   Message from Jonnie Finner, RN sent at 11/05/2018 11:24 AM EDT -----  ----- Message ----- From: Truitt Merle, MD Sent: 11/05/2018  11:19 AM EDT To: Jonnie Finner, RN  Please let pt know his MRI results, overall stable, I have talked to Dr. Annamaria Boots and we recommend continue observation for now. I will see him next month and order next scan on his next visit. Thanks   Truitt Merle  11/05/2018

## 2018-11-18 ENCOUNTER — Ambulatory Visit
Admission: RE | Admit: 2018-11-18 | Discharge: 2018-11-18 | Disposition: A | Discharge status: Home / Self Care | Payer: Medicare HMO | Source: Ambulatory Visit | Attending: Physician Assistant | Admitting: Physician Assistant

## 2018-11-18 ENCOUNTER — Encounter: Payer: Self-pay | Admitting: *Deleted

## 2018-11-18 ENCOUNTER — Other Ambulatory Visit: Payer: Self-pay

## 2018-11-18 ENCOUNTER — Telehealth: Payer: Self-pay | Admitting: Primary Care

## 2018-11-18 DIAGNOSIS — C220 Liver cell carcinoma: Secondary | ICD-10-CM | POA: Diagnosis not present

## 2018-11-18 DIAGNOSIS — R0602 Shortness of breath: Secondary | ICD-10-CM | POA: Diagnosis not present

## 2018-11-18 DIAGNOSIS — J438 Other emphysema: Secondary | ICD-10-CM | POA: Diagnosis not present

## 2018-11-18 HISTORY — PX: IR RADIOLOGIST EVAL & MGMT: IMG5224

## 2018-11-18 NOTE — Progress Notes (Signed)
Patient ID: Zachary Steele, male   DOB: February 12, 1951, 68 y.o.   MRN: [id number]       Chief Complaint: Patient was consulted remotely today ([place]) for multifocal hepatocellular carcinoma at the request of  Dr Burr Medico  Referring Physician(s): Burr Medico  History of Present Illness: Zachary Steele is a 68 y.o. male with chronic hepatitis C, alcoholism, cirrhosis and multifocal hepatocellular carcinoma.  He is now status post bilateral hepatic Y 90 embolizations and a peripheral right hepatic bland embolization.  Our visit today is a telemetry visit.  Overall he is doing very well.  He is at his baseline.  Stable functional status.  No current flank or abdominal pain.  No signs of liver failure.  Stable LFTs.  Stable weight and appetite.  He does not drink.  He continues to smoke occasionally.  He is experiencing some shortness of breath symptoms and has started to use an inhaler.  He has follow-up with pulmonology.  Recent MRI demonstrates a stable treatment changes to the dominant lesions.  No significant signs of progression.  There are at least 3 small subcentimeter enhancing foci predominate the left lobe suspicious for dysplastic nodules are developing small HCC's.  Stable AFP.  Overall MRI has improved compared to 04/20/2018 following the bland embolization.  Past Medical History:  Diagnosis Date  . Alcohol dependence ([place])   . Anemia    denies  . Arthritis   . Cataract   . Cirrhosis ([place])   . Coronary artery disease due to lipid rich plaque   . Depression   . Hepatitis C    Has A and B also  . PAD (peripheral artery disease) ([place])    pt reports he has this and has chronic leg "soreness"  . Peripheral vascular disease ([place])    essentially normal circulations with two-vessel  runoff to the feet bilaterally  . Tobacco abuse     Past Surgical History:  Procedure Laterality Date  . CARDIAC CATHETERIZATION    . COLONOSCOPY    . CORONARY ARTERY BYPASS GRAFT  2010   Triple bypass at  [facility]  . DOPPLER ECHOCARDIOGRAPHY  2011  . IR ANGIOGRAM SELECTIVE EACH ADDITIONAL VESSEL  10/27/2017  . IR ANGIOGRAM SELECTIVE EACH ADDITIONAL VESSEL  10/27/2017  . IR ANGIOGRAM SELECTIVE EACH ADDITIONAL VESSEL  10/27/2017  . IR ANGIOGRAM SELECTIVE EACH ADDITIONAL VESSEL  10/27/2017  . IR ANGIOGRAM SELECTIVE EACH ADDITIONAL VESSEL  10/27/2017  . IR ANGIOGRAM SELECTIVE EACH ADDITIONAL VESSEL  10/27/2017  . IR ANGIOGRAM SELECTIVE EACH ADDITIONAL VESSEL  10/27/2017  . IR ANGIOGRAM SELECTIVE EACH ADDITIONAL VESSEL  11/06/2017  . IR ANGIOGRAM SELECTIVE EACH ADDITIONAL VESSEL  11/06/2017  . IR ANGIOGRAM SELECTIVE EACH ADDITIONAL VESSEL  11/06/2017  . IR ANGIOGRAM SELECTIVE EACH ADDITIONAL VESSEL  12/04/2017  . IR ANGIOGRAM SELECTIVE EACH ADDITIONAL VESSEL  12/04/2017  . IR ANGIOGRAM SELECTIVE EACH ADDITIONAL VESSEL  05/06/2018  . IR ANGIOGRAM SELECTIVE EACH ADDITIONAL VESSEL  05/06/2018  . IR ANGIOGRAM SELECTIVE EACH ADDITIONAL VESSEL  05/06/2018  . IR ANGIOGRAM SELECTIVE EACH ADDITIONAL VESSEL  05/06/2018  . IR ANGIOGRAM VISCERAL SELECTIVE  10/27/2017  . IR ANGIOGRAM VISCERAL SELECTIVE  11/06/2017  . IR ANGIOGRAM VISCERAL SELECTIVE  12/04/2017  . IR ANGIOGRAM VISCERAL SELECTIVE  05/06/2018  . IR EMBO ARTERIAL NOT HEMORR HEMANG INC GUIDE ROADMAPPING  10/27/2017  . IR EMBO TUMOR ORGAN ISCHEMIA INFARCT INC GUIDE ROADMAPPING  11/06/2017  . IR EMBO TUMOR ORGAN ISCHEMIA INFARCT INC GUIDE ROADMAPPING  12/04/2017  .  IR EMBO TUMOR ORGAN ISCHEMIA INFARCT INC GUIDE ROADMAPPING  05/06/2018  . IR RADIOLOGIST EVAL & MGMT  08/12/2017  . IR RADIOLOGIST EVAL & MGMT  09/22/2017  . IR RADIOLOGIST EVAL & MGMT  01/07/2018  . IR RADIOLOGIST EVAL & MGMT  04/20/2018  . IR US GUIDE VASC ACCESS RIGHT  10/27/2017  . IR US GUIDE VASC ACCESS RIGHT  11/06/2017  . IR US GUIDE VASC ACCESS RIGHT  12/04/2017  . IR US GUIDE VASC ACCESS RIGHT  05/06/2018  . NM MYOVIEW LTD  2012    Allergies: Statins  Medications: Prior to Admission medications    Medication Sig Start Date End Date Taking? Authorizing Provider  acetaminophen (TYLENOL) 500 MG tablet Take 1,000 mg by mouth every 6 (six) hours as needed for mild pain.     [provider]  albuterol (PROVENTIL HFA;VENTOLIN HFA) 108 (90 Base) MCG/ACT inhaler Inhale 2 puffs into the lungs every 6 (six) hours as needed for wheezing or shortness of breath. 02/15/18   Chesley Mires, MD  aspirin 81 MG tablet Take 81 mg by mouth daily.    [provider]  Cholecalciferol (VITAMIN D3 PO) Take 125 mcg by mouth daily.    [provider]  LORazepam (ATIVAN) 0.5 MG tablet Take 1 tablet (0.5 mg) approximately 30 minutes prior to MRI. If still anxious, may take additional 1 tablet (0.5 mg). 10/12/18   Henson, Vickie L, NP-C  Multiple Vitamin (MULTIVITAMIN WITH MINERALS) TABS tablet Take 1 tablet by mouth daily.    [provider]  pantoprazole (PROTONIX) 40 MG tablet 40 mg one tab 30 minutes before breakfast meal 10/04/18   Martyn Ehrich, NP  Tiotropium Bromide-Olodaterol (STIOLTO RESPIMAT) 2.5-2.5 MCG/ACT AERS Inhale 2 puffs into the lungs daily. 10/04/18   Martyn Ehrich, NP     Family History  Problem Relation Age of Onset  . Stroke Mother   . Colon cancer Neg Hx     Social History   Socioeconomic History  . Marital status: Married    Spouse name: Not on file  . Number of children: 1  . Years of education: Not on file  . Highest education level: Not on file  Occupational History  . Occupation: Retail buyer    Comment: city of [place]  . Financial resource strain: Not on file  . Food insecurity    Worry: Not on file    Inability: Not on file  . Transportation needs    Medical: Not on file    Non-medical: Not on file  Tobacco Use  . Smoking status: Current Some Day Smoker    Packs/day: 0.10    Years: 52.00    Pack years: 5.20    Types: Cigarettes  . Smokeless tobacco: Never Used  . Tobacco comment: Is trying to stop smoking,  still has one or two per day most days  Substance and Sexual Activity  . Alcohol use: No    Frequency: Never    Comment: quit 04/2017; previous 40 year history 1 wine bottle per day  . Drug use: No    Comment: occ  . Sexual activity: Yes  Lifestyle  . Physical activity    Days per week: Not on file    Minutes per session: Not on file  . Stress: Not on file  Relationships  . Social Herbalist on phone: Not on file    Gets together: Not on file    Attends  religious service: Not on file    Active member of club or organization: Not on file    Attends meetings of clubs or organizations: Not on file    Relationship status: Not on file  Other Topics Concern  . Not on file  Social History Narrative  . Not on file    ECOG Status: 1 - Symptomatic but completely ambulatory  Review of Systems  Review of Systems: A 12 point ROS discussed and pertinent positives are indicated in the HPI above.  All other systems are negative.  Physical Exam No direct physical exam was performed, today's visit is by telephone because of COVID pandemic.  Vital Signs: There were no vitals taken for this visit.  Imaging: Mr Abdomen W Wo Contrast  Result Date: 11/04/2018 CLINICAL DATA:  Cirrhosis with hepatocellular carcinoma. Multiple prior treatments. Recent Y 90 treatment in January 2020. EXAM: MRI ABDOMEN WITHOUT AND WITH CONTRAST TECHNIQUE: Multiplanar multisequence MR imaging of the abdomen was performed both before and after the administration of intravenous contrast. CONTRAST:  68mL MULTIHANCE GADOBENATE DIMEGLUMINE 529 MG/ML IV SOLN COMPARISON:  MRI 07/24/2017 and 04/20/2018 FINDINGS: Lower chest: The lung bases demonstrate bibasilar atelectasis but no worrisome pulmonary lesions or pleural or pericardial effusions. Hepatobiliary: Chronic changes of cirrhosis involving the liver with fairly diffuse fatty infiltration with significant signal loss on the out of phase T1 weighted gradient echo  sequence. Areas of focal fatty sparing noted mainly around the gallbladder and around the segment 4B liver lesion. The segment 4B liver lesion demonstrates some areas of increased T1 signal intensity suggesting a small amount of hemorrhage. The lesion has decreased in size. It measured a maximum of 3.7 cm on the prior study and now measures 3.3 cm. There is a rim of peripheral enhancement which could be post treatment changes with granulation tissue/scar tissue. I do not see any significant nodularity to suggest this is tumor but this will need to be followed. Progressive capsular retraction overlying the lesion would suggest scarring also. The early arterial phase images demonstrate slight enlarging left hepatic lobe lesions. Segment 3 lesion on image number 35 of series 12 measures 9 mm and previously measured 5 mm. Slightly more inferiorly in segment 3 is a 9 mm lesion which previously measured 5 mm. Small lesion in segment 4 B medially measures 7 mm and previously measured 7 mm. No new early arterial phase enhancing nodules. Numerous gallstones are noted the gallbladder. No findings for acute cholecystitis. Normal caliber and course of the common bile duct. Pancreas:  No mass, inflammation or ductal dilatation. Spleen:  Normal size.  No focal lesions. Adrenals/Urinary Tract: The adrenal glands and kidneys are unremarkable and stable. Stomach/Bowel: The stomach, duodenum, visualized small bowel and visualize colon are grossly normal. Vascular/Lymphatic: The aorta and branch vessels are patent. The major venous structures are patent. No mesenteric or retroperitoneal mass or adenopathy. Other:  No ascites. Musculoskeletal: No significant bony findings. IMPRESSION: 1. Stable cirrhotic changes involving the liver along with fairly diffuse fatty infiltration. 2. Interval decrease in size of the segment 4B liver lesion when compared to prior studies. There is a persistent rim of enhancement around the lesion but I do  not see any nodularity or mass effect to suggest this is tumor. It certainly could be enhancing granulation tissue or inflammatory change. Recommend continued surveillance. 3. Slight interval enlargement of two segment III early arterial phase enhancing nodules which could be dysplastic nodules or small HCC's. A third nodule in segment 4B is stable  at 7 mm. 4. Cholelithiasis without sonographic findings for acute cholecystitis. Normal common bile duct. 5. No abdominal lymphadenopathy. Electronically Signed   By: Marijo Sanes M.D.   On: 11/04/2018 16:10    Labs:  CBC: Recent Labs    12/04/17 0800 02/25/18 1023 05/06/18 1213 08/26/18 1115  WBC 4.5 4.1 4.0 4.5  HGB 12.3* 12.6* 13.1 15.2  HCT 35.8* 38.0* 40.9 44.7  PLT 130* 120* 103* 106*    COAGS: Recent Labs    12/04/17 0800 05/06/18 1213  INR 1.00 1.05    BMP: Recent Labs    12/04/17 0850 02/25/18 1023 05/06/18 1213 08/26/18 1115  NA 141 143 138 139  K 3.7 4.0 3.7 3.9  CL 106 106 106 106  CO2 26 28 23 24   GLUCOSE 82 116* 89 79  BUN 7* 6* 7* 6*  CALCIUM 9.2 9.6 9.4 9.6  CREATININE 0.70 0.88 0.79 0.90  GFRNONAA >60 >60 >60 >60  GFRAA >60 >60 >60 >60    LIVER FUNCTION TESTS: Recent Labs    12/04/17 0850 02/25/18 1023 05/06/18 1213 08/26/18 1115  BILITOT 1.0 0.6 0.9 0.7  AST 29 36 42* 45*  ALT 15 18 22 28   ALKPHOS 85 128* 107 125  PROT 7.5 8.3* 8.0 8.6*  ALBUMIN 3.3* 3.3* 3.7 3.8    TUMOR MARKERS: No results for input(s): AFPTM, CEA, CA199, CHROMGRNA in the last 8760 hours.  Assessment and Plan:  Multifocal hepatocellular carcinoma status post bilateral Y 90 radio embolizations and an additional right hepatic peripheral bland embolization for multifocal disease.  Most recent MRI July 2020 demonstrates stable disease and treatment changes.  Small subcentimeter dysplastic nodules versus tiny H cc are noted.  Certainly no progression of disease.  Overall he is doing very well.  Plan: Continue close MRI  surveillance at 3 to 6 months.  He has close follow-up with Dr. Burr Medico in oncology.  Pulmonary follow-up for shortness of breath and chronic emphysema.   Electronically Signed: Greggory Keen 11/18/2018, 2:14 PM   I spent a total of    25 Minutes in remote  clinical consultation, greater than 50% of which was counseling/coordinating care for this patient with multifocal hepatocellular carcinoma.    Visit type: Audio only (telephone). Audio (no video) only due to patient's lack of internet/smartphone capability. Alternative for in-person consultation at [facility], [address] [place], [place], [place]. This visit type was conducted due to national recommendations for restrictions regarding the COVID-19 Pandemic (e.g. social distancing).  This format is felt to be most appropriate for this patient at this time.  All issues noted in this document were discussed and addressed.

## 2018-11-18 NOTE — Telephone Encounter (Signed)
LMOMTCB for pt  

## 2018-11-18 NOTE — Telephone Encounter (Signed)
Attempted to call pt but line went straight to VM. Left message for pt to return call. 

## 2018-11-18 NOTE — Telephone Encounter (Signed)
Pt  Returned call and would like a call back

## 2018-11-19 NOTE — Telephone Encounter (Signed)
Attempted to call pt but unable to reach. Left message for pt to return call. 

## 2018-11-22 ENCOUNTER — Telehealth: Payer: Self-pay | Admitting: Hematology

## 2018-11-22 NOTE — Telephone Encounter (Signed)
R/s apt per 8/09 sch message - pt is aware of new time and date.

## 2018-11-22 NOTE — Telephone Encounter (Signed)
Pt has been scheduled an appt with [facility] 8/11. Nothing further needed.

## 2018-11-23 ENCOUNTER — Ambulatory Visit (INDEPENDENT_AMBULATORY_CARE_PROVIDER_SITE_OTHER): Payer: Medicare HMO | Admitting: Primary Care

## 2018-11-23 ENCOUNTER — Other Ambulatory Visit: Payer: Self-pay

## 2018-11-23 ENCOUNTER — Telehealth: Payer: Self-pay | Admitting: Primary Care

## 2018-11-23 ENCOUNTER — Encounter: Payer: Self-pay | Admitting: Primary Care

## 2018-11-23 VITALS — BP 118/70 | HR 82 | Temp 98.0°F | Ht 67.0 in | Wt 150.2 lb

## 2018-11-23 DIAGNOSIS — J849 Interstitial pulmonary disease, unspecified: Secondary | ICD-10-CM

## 2018-11-23 DIAGNOSIS — J449 Chronic obstructive pulmonary disease, unspecified: Secondary | ICD-10-CM

## 2018-11-23 MED ORDER — TRELEGY ELLIPTA 100-62.5-25 MCG/INH IN AEPB: 1.0000 | INHALATION_SPRAY | Freq: Every day | RESPIRATORY_TRACT | 0 refills | Status: DC

## 2018-11-23 NOTE — Assessment & Plan Note (Addendum)
-   FEV1 2.31 (84%), ratio 69, DLCOcor 45%, - Spiriva changed to Stiolto in June with no significant improvement - Trial Trelegy 1 puff daily - FU in 1 month with Dr. Halford Chessman

## 2018-11-23 NOTE — Telephone Encounter (Signed)
I have referred patient to rheumatology twice and this has yet to be completed. Under referral tab it states that encounter is closed and "do not schedule". Can we call their office and to inquire about this. I am concerned patient may have underlying CT/rheumatologic disease causing lung fibrosis.

## 2018-11-23 NOTE — Progress Notes (Signed)
@Patient  ID: Zachary Steele, male    DOB: [birth year], 68 y.o.   MRN: [id number]  Chief Complaint  Patient presents with   Follow-up    F/U for SOB and chest pressure. States the chest pressure comes and go and is associated with gas.     Referring provider: [name], NP-C  HPI: 68 year old male, current some day smoker. PMH significant ILD, COPD, ETOH, hep C, cirrhosis, hepatocellular carcinoma, emphysema. Patient of Dr. Halford Chessman, seen for initial consult on 02/15/18. Quit drinking alcohol in Jan 2019. Complains of intermittent cough with clear sputum, dyspnea when walking up hills. Works with [employer]. No on any inhaler medication currently. Plan to check A1AT level and PFTs, Given RX for proair as needed. Encouraged smoking cessation. Received influenza vaccine on 02/25/18.   Previous [facility] pulmonary encounters: 04/08/2018 Patient presents today for 6 week follow-up with PFTs.  Patient states that he still gets short of breath with walking. He has been carrying his rescue inhaler with him and uses it once and a while. Has not noticed much difference with SABA.  Continues to have a dry hacking cough.  He is smoking no more than 2 cigarettes a day. Describes some intermittent lightheadedness and dizziness with activity that lasts less than a minute.  Reports it is similar feeling as a tobacco high. He states that belching makes this go away. Following with [facility] for liver cirrhosis and hep c.  Orthostatic blood pressure check today negative. Ambulatory O2 sat with no desaturation or reproducible lightheadedness.  Denies chest pain, wheezing, productive cough, hemoptysis. Started on Spiriva. Recommend FU in 4-6 weeks.  10/04/2018 Patient presents today for follow-up. Experienced shortness of breath during a recent MRI with IV contrast for cirrhosis/GI follow-up. States that he had to get out of the machine. Does not think it was a reaction to dye. He has experienced shortness of breath  other times such as talking and at night. Symptoms typically resolve with rest/sitting. Belching also relieves perceived shortness of breath. He is not taking protonix. Still smoking <1 cigarette a day, has not quit. Daily cough with some production described as light yellow/cream.  Pulmonary function testing in December showed mild obstructive lung disease with decreased DLCO. 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 Rheumatoid factor elevated and ANCA positive. Referred to [facility] rheumatology. Per chart review on 05/04/18 referral was declined by [facility] rheumatology. Had an apt 05/24/18 with medical associated which he no showed. I do not think he was aware of this appointment. Has arthritis in his hands. Issues tying bags.   11/23/2018 Patient presents today for 2 month follow-up. Patient feels his breathing has worsened. Experiences shortness of breath with slight activity. Loses his breath walking. We changed Spiriva changed to Stiolto back in June with no significant improvement. Continues to complains of upper and lower GI gas, taking protonix daily with min relief. This is embarrassing to him. He is taking protonix daily. Follows with Dr. Annamaria Boots for hepatocellular carcinoma. Will check ambulatory O2 today. He was referred to rheumatology twice for positive serology but appointment appears to have been cancelled by their office and documented as "do not schedule". This was not relayed to me and I am unclear the reason. Needs repeat HRCT in 2-4 weeks.    Pulmonary function test: 04/08/2018 - FVC 3.35 (93%), FEV1 2.31 (84%), ratio 69, DLCOcor 45%, no BD response. Curvature to flow volume loop   Imaging: 12/21/17  CT Chest w/ contrast - Diffuse bronchial wall thickening with mild centrilobular and moderate paraseptal emphysema most notably in the lung apices. Areas of ground-glass attenuation, septal thickening, subpleural reticulation, mild cylindrical  bronchiectasis and peripheral bronchiolectasis are noted in the lung bases bilaterally, most severe in the left lower lobe. Calcified granuloma in the inferior segment of the lingula.  Labs: Alpha-1 antitrypsin- 201 (H) Rheumatoid factor- 145 (H) ANCA- positive   Allergies  Allergen Reactions   Statins Other (See Comments)    Alcoholic cirrhosis, cardiology has declined to start statin    Immunization History  Administered Date(s) Administered   Influenza, High Dose Seasonal PF 02/15/2018   Pneumococcal Polysaccharide-23 09/19/2011    Past Medical History:  Diagnosis Date   Alcohol dependence ([place])    Anemia    denies   Arthritis    Cataract    Cirrhosis ([place])    Coronary artery disease due to lipid rich plaque    Depression    Hepatitis C    Has A and B also   PAD (peripheral artery disease) ([place])    pt reports he has this and has chronic leg "soreness"   Peripheral vascular disease ([place])    essentially normal circulations with two-vessel  runoff to the feet bilaterally   Tobacco abuse     Tobacco History: Social History   Tobacco Use  Smoking Status Current Some Day Smoker   Packs/day: 0.10   Years: 52.00   Pack years: 5.20   Types: Cigarettes  Smokeless Tobacco Never Used  Tobacco Comment   Is trying to stop smoking, still has one or two per day most days   Ready to quit: Not Answered Counseling given: Not Answered Comment: Is trying to stop smoking, still has one or two per day most days   Outpatient Medications Prior to Visit  Medication Sig Dispense Refill   acetaminophen (TYLENOL) 500 MG tablet Take 1,000 mg by mouth every 6 (six) hours as needed for mild pain.      albuterol (PROVENTIL HFA;VENTOLIN HFA) 108 (90 Base) MCG/ACT inhaler Inhale 2 puffs into the lungs every 6 (six) hours as needed for wheezing or shortness of breath. 1 Inhaler 6   aspirin 81 MG tablet Take 81 mg by mouth daily.     Cholecalciferol (VITAMIN D3 PO)  Take 125 mcg by mouth daily.     LORazepam (ATIVAN) 0.5 MG tablet Take 1 tablet (0.5 mg) approximately 30 minutes prior to MRI. If still anxious, may take additional 1 tablet (0.5 mg). 4 tablet 0   Multiple Vitamin (MULTIVITAMIN WITH MINERALS) TABS tablet Take 1 tablet by mouth daily.     pantoprazole (PROTONIX) 40 MG tablet 40 mg one tab 30 minutes before breakfast meal 90 tablet 3   Tiotropium Bromide-Olodaterol (STIOLTO RESPIMAT) 2.5-2.5 MCG/ACT AERS Inhale 2 puffs into the lungs daily. 4 g 0   No facility-administered medications prior to visit.    Review of Systems  Review of Systems  Constitutional: Positive for fatigue.  HENT: Negative.   Respiratory: Positive for shortness of breath.   Cardiovascular: Negative.   Gastrointestinal:       Upper/lower GI gas    Physical Exam  BP 118/70    Pulse 82    Temp 98 F (36.7 C) (Oral)    Ht 5\' 7"  (1.702 m)    Wt 150 lb 3.2 oz (68.1 kg)    SpO2 97%    BMI 23.52 kg/m  Physical Exam Constitutional:  Appearance: Normal appearance.  HENT:     Head: Normocephalic and atraumatic.     Right Ear: Tympanic membrane normal.     Left Ear: Tympanic membrane normal.     Mouth/Throat:     Mouth: Mucous membranes are moist.     Pharynx: Oropharynx is clear.  Neck:     Musculoskeletal: Normal range of motion and neck supple.  Cardiovascular:     Rate and Rhythm: Normal rate and regular rhythm.  Pulmonary:     Effort: Pulmonary effort is normal.     Breath sounds: No wheezing.     Comments: Fine crackles bases; O2 97% RA Musculoskeletal: Normal range of motion.        General: No deformity.  Skin:    General: Skin is warm and dry.  Neurological:     General: No focal deficit present.     Mental Status: He is alert and oriented to person, place, and time. Mental status is at baseline.  Psychiatric:        Mood and Affect: Mood normal.        Behavior: Behavior normal.        Thought Content: Thought content normal.         Judgment: Judgment normal.      Lab Results:  CBC    Component Value Date/Time   WBC 4.5 08/26/2018 1115   WBC 4.0 05/06/2018 1213   RBC 4.72 08/26/2018 1115   HGB 15.2 08/26/2018 1115   HGB 12.9 (L) 05/19/2017 1010   HGB 12.9 (L) 11/19/2009 1342   HCT 44.7 08/26/2018 1115   HCT 37.5 05/19/2017 1010   HCT 36.6 (L) 11/19/2009 1342   PLT 106 (L) 08/26/2018 1115   PLT 90 (LL) 05/19/2017 1010   MCV 94.7 08/26/2018 1115   MCV 104 (H) 05/19/2017 1010   MCV 111.5 (H) 11/19/2009 1342   MCH 32.2 08/26/2018 1115   MCHC 34.0 08/26/2018 1115   RDW 14.1 08/26/2018 1115   RDW 13.5 05/19/2017 1010   RDW 13.1 11/19/2009 1342   LYMPHSABS 1.8 08/26/2018 1115   LYMPHSABS 1.7 05/19/2017 1010   LYMPHSABS 1.6 11/19/2009 1342   MONOABS 0.5 08/26/2018 1115   MONOABS 0.6 11/19/2009 1342   EOSABS 0.3 08/26/2018 1115   EOSABS 0.3 05/19/2017 1010   BASOSABS 0.1 08/26/2018 1115   BASOSABS 0.1 05/19/2017 1010   BASOSABS 0.0 11/19/2009 1342    BMET    Component Value Date/Time   NA 139 08/26/2018 1115   NA 140 05/19/2017 1010   K 3.9 08/26/2018 1115   CL 106 08/26/2018 1115   CO2 24 08/26/2018 1115   GLUCOSE 79 08/26/2018 1115   BUN 6 (L) 08/26/2018 1115   BUN 5 (L) 05/19/2017 1010   CREATININE 0.90 08/26/2018 1115   CALCIUM 9.6 08/26/2018 1115   GFRNONAA >60 08/26/2018 1115   GFRAA >60 08/26/2018 1115    BNP No results found for: BNP  ProBNP    Component Value Date/Time   PROBNP 78.0 04/28/2008 1637    Imaging: Mr Abdomen W Wo Contrast  Result Date: 11/04/2018 CLINICAL DATA:  Cirrhosis with hepatocellular carcinoma. Multiple prior treatments. Recent Y 90 treatment in January 2020. EXAM: MRI ABDOMEN WITHOUT AND WITH CONTRAST TECHNIQUE: Multiplanar multisequence MR imaging of the abdomen was performed both before and after the administration of intravenous contrast. CONTRAST:  88mL MULTIHANCE GADOBENATE DIMEGLUMINE 529 MG/ML IV SOLN COMPARISON:  MRI 07/24/2017 and 04/20/2018  FINDINGS: Lower chest: The lung bases demonstrate bibasilar  atelectasis but no worrisome pulmonary lesions or pleural or pericardial effusions. Hepatobiliary: Chronic changes of cirrhosis involving the liver with fairly diffuse fatty infiltration with significant signal loss on the out of phase T1 weighted gradient echo sequence. Areas of focal fatty sparing noted mainly around the gallbladder and around the segment 4B liver lesion. The segment 4B liver lesion demonstrates some areas of increased T1 signal intensity suggesting a small amount of hemorrhage. The lesion has decreased in size. It measured a maximum of 3.7 cm on the prior study and now measures 3.3 cm. There is a rim of peripheral enhancement which could be post treatment changes with granulation tissue/scar tissue. I do not see any significant nodularity to suggest this is tumor but this will need to be followed. Progressive capsular retraction overlying the lesion would suggest scarring also. The early arterial phase images demonstrate slight enlarging left hepatic lobe lesions. Segment 3 lesion on image number 35 of series 12 measures 9 mm and previously measured 5 mm. Slightly more inferiorly in segment 3 is a 9 mm lesion which previously measured 5 mm. Small lesion in segment 4 B medially measures 7 mm and previously measured 7 mm. No new early arterial phase enhancing nodules. Numerous gallstones are noted the gallbladder. No findings for acute cholecystitis. Normal caliber and course of the common bile duct. Pancreas:  No mass, inflammation or ductal dilatation. Spleen:  Normal size.  No focal lesions. Adrenals/Urinary Tract: The adrenal glands and kidneys are unremarkable and stable. Stomach/Bowel: The stomach, duodenum, visualized small bowel and visualize colon are grossly normal. Vascular/Lymphatic: The aorta and branch vessels are patent. The major venous structures are patent. No mesenteric or retroperitoneal mass or adenopathy. Other:  No  ascites. Musculoskeletal: No significant bony findings. IMPRESSION: 1. Stable cirrhotic changes involving the liver along with fairly diffuse fatty infiltration. 2. Interval decrease in size of the segment 4B liver lesion when compared to prior studies. There is a persistent rim of enhancement around the lesion but I do not see any nodularity or mass effect to suggest this is tumor. It certainly could be enhancing granulation tissue or inflammatory change. Recommend continued surveillance. 3. Slight interval enlargement of two segment III early arterial phase enhancing nodules which could be dysplastic nodules or small HCC's. A third nodule in segment 4B is stable at 7 mm. 4. Cholelithiasis without sonographic findings for acute cholecystitis. Normal common bile duct. 5. No abdominal lymphadenopathy. Electronically Signed   By: Marijo Sanes M.D.   On: 11/04/2018 16:10   Ir Radiologist Eval & Mgmt  Result Date: 11/18/2018 Please refer to notes tab for details about interventional procedure. (Op Note)    Assessment & Plan:   ILD (interstitial lung disease) ([place]) - Shortness of breath worsening  - Ambulatory O2 without oxygen desaturation  - CT chest in September 2019 showed probable interstitial lung disease in lung bases - Rheumatoid factor elevated and ANCA positive - Referred to rheumatology twice (not completed for unknown reason) - Needs repeat HRCT in 2-4 weeks   Chronic obstructive pulmonary disease ([facility]) - FEV1 2.31 (84%), ratio 69, DLCOcor 45%, - Spiriva changed to Gantt in June with no significant improvement - Trial Trelegy 1 puff daily - FU in 1 month with Dr. Almedia Balls, NP 11/23/2018

## 2018-11-23 NOTE — Patient Instructions (Addendum)
Ambulatory O2 with no oxygen desaturation  HRCT in 2-4 weeks re: ILD   Re-refer to rheumatology re: positive serology  Trial Trelegy 1 puff daily   Follow up with Dr. Halford Chessman (preferred) in 1 month after CT scan

## 2018-11-23 NOTE — Assessment & Plan Note (Addendum)
-   Shortness of breath worsening  - Ambulatory O2 without oxygen desaturation  - CT chest in September 2019 showed probable interstitial lung disease in lung bases - Rheumatoid factor elevated and ANCA positive - Referred to rheumatology twice (not completed for unknown reason) - Needs repeat HRCT in 2-4 weeks

## 2018-11-24 NOTE — Telephone Encounter (Signed)
Zachary Steele asked me to check on this.  I called [employer] and spoke to [employer].  Pt was seen on 6/30 by Dr Dossie Der.  He had a follow up appt in July but cancelled.  They are going to fax office not to me.  Nothing further needed.

## 2018-11-24 NOTE — Telephone Encounter (Signed)
Some of the rheumatology practices in [place] will not accept patients based on their insurance coverage.  Not sure if this is the issue.  If so, then referral needs to be sent to a different rheumatology practice.   It appears initial referral went to Dr. Arlean Hopping practice, and referral was declined by this practice.  Subsequently, it appears referral was placed to [facility] on 10/07/18.  Uncertain of status regarding appointment after this referral.    Will route to [facility] and Triage to follow up on referral.

## 2018-11-25 ENCOUNTER — Telehealth: Payer: Self-pay | Admitting: Primary Care

## 2018-11-25 NOTE — Telephone Encounter (Signed)
Patient was seen by Dr. Dossie Der with [facility] medial associates on 10/12/18. Reviewed note. Clinically patient had evidence of osteoarthritis  In his hands. No swelling noted. Hep C can give false positive rheumatoid factor. Other autoimmune disease like sarcoidosis, myositis, anti-synthetase syndrome and rheumatoid arthritis can causes ILD. Ordered for serology markers, XRAY hands. Recommended 2 week follow-up  Please see if labs and hand xray were completed with [place] and have results faxed to me and please ask patient to scheduled follow-up with Dr. Dossie Der

## 2018-11-25 NOTE — Telephone Encounter (Signed)
Call made to [facility], patient was seen June 30th and he cancelled July 14th. According to their records he has not had any of the labs no xrays done.   LMTCB with patient for an update.

## 2018-11-26 ENCOUNTER — Other Ambulatory Visit: Payer: Medicare HMO

## 2018-11-26 ENCOUNTER — Ambulatory Visit: Payer: Medicare HMO | Admitting: Hematology

## 2018-12-02 NOTE — Telephone Encounter (Signed)
I didn't know if we had received the office note from 10/12/18. I called the office again asking for them to refax to my fax number. I received the office note and gave to [facility] to review

## 2018-12-03 NOTE — Progress Notes (Signed)
Reviewed and agree with assessment/plan.   Krystena Reitter, MD [facility] 04/09/2016, 12:24 PM Pager:  [phone]  

## 2018-12-05 NOTE — Progress Notes (Signed)
Reviewed and agree with assessment/plan.   Saje Gallop, MD [facility] 04/09/2016, 12:24 PM Pager:  [phone]  

## 2018-12-06 ENCOUNTER — Ambulatory Visit: Payer: Medicare HMO | Admitting: Primary Care

## 2018-12-07 ENCOUNTER — Telehealth: Payer: Self-pay | Admitting: Primary Care

## 2018-12-07 MED ORDER — TRELEGY ELLIPTA 100-62.5-25 MCG/INH IN AEPB: 1.0000 | INHALATION_SPRAY | Freq: Every day | RESPIRATORY_TRACT | 5 refills | Status: DC

## 2018-12-07 NOTE — Telephone Encounter (Signed)
ATC pt, line went to voicemail. I have left a detailed voice message based on pt's DPR permissions that we have send a prescription for Trelegy to [facility]. Pt was recently trialed on Trelegy as of office note 11/23/2018. I let him know if he has any other questions or concerns to give our office a call back. Nothing further needed at this time.

## 2018-12-22 ENCOUNTER — Other Ambulatory Visit: Payer: Self-pay

## 2018-12-22 ENCOUNTER — Ambulatory Visit (INDEPENDENT_AMBULATORY_CARE_PROVIDER_SITE_OTHER)
Admission: RE | Admit: 2018-12-22 | Discharge: 2018-12-22 | Disposition: A | Discharge status: Home / Self Care | Payer: Medicare HMO | Source: Ambulatory Visit | Attending: Primary Care | Admitting: Primary Care

## 2018-12-22 DIAGNOSIS — J849 Interstitial pulmonary disease, unspecified: Secondary | ICD-10-CM | POA: Diagnosis not present

## 2018-12-22 DIAGNOSIS — R0602 Shortness of breath: Secondary | ICD-10-CM | POA: Diagnosis not present

## 2018-12-24 IMAGING — NM NM RADIO PHARM THERAPY INTRA ARTERIAL
1 series · 6 of 6 positions shown · non-contrast
Comparison: MAA scan 11/06/2017, MRI abdomen 07/24/2017, angiogram
11/06/2017

CLINICAL DATA: Hepatocellular carcinoma. Unresectable. Second
treatment overall.First treatment to left lobe of liver.

EXAM:
NUCLEAR MEDICINE SPECIAL MED RAD PHYSICS CONS; NUCLEAR MEDICINE
RADIO PHARM THERAPY INTRA ARTERIAL; NUCLEAR MEDICINE TREATMENT
PROCEDURE; NUCLEAR MEDICINE LIVER SCAN
TECHNIQUE: In conjunction with the interventional radiologist a Y- Microsphere
dose was calculated utilizing body surface area formulation.
Calculated dose equal 16.2 mCi. Pre therapy MAA liver SPECT scan and
CTA were evaluated. Utilizing a microcatheter system, the hepatic
artery was selected and Y-90 microspheres were delivered in
fractionated aliquots. Radiopharmaceutical was delivered by the
interventional radiologist and nuclear radiologist.
The patient tolerated procedure well. No adverse effects were noted.
Bremsstrahlung planar and SPECT imaging of the abdomen following
intrahepatic arterial delivery of Y-90 microsphere was performed.
RADIOPHARMACEUTICALS:  14.96 millicuriesJ-P7 Microspheres

[Series 2: y-90 spect · 4.14mm/px · 6 of 64 frames shown]
[frame 6/64]
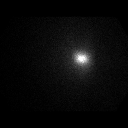
[frame 16/64]
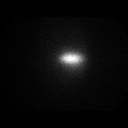
[frame 27/64]
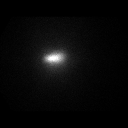
[frame 38/64]
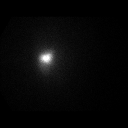
[frame 48/64]
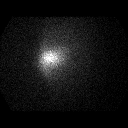
[frame 59/64]
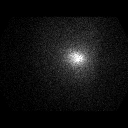

[6 of 6 positions shown; findings below may reference images not displayed]

FINDINGS: [AGE] microspheres therapy as above. First therapy the right
hepatic lobe.

Bremsstrahlung planar and SPECT imaging of the abdomen following
intrahepatic arterial delivery of F-I6microsphere demonstrates
radioactivity localized to the left hepatic lobe. No evidence of
extrahepatic activity.
IMPRESSION: Successful [AGE] microsphere delivery for treatment of unresectable
liver metastasis. First therapy to the left lobe.

Bremssstrahlung scan demonstrates activity localized to left hepatic
lobe with no extrahepatic activity identified.

## 2018-12-24 IMAGING — XA IR ANGIO/ADD [PERSON_NAME]
9 series · 14 of 24 positions shown · IV contrast (IODINE)
Comparison: none

INDICATION: Multifocal hepatocellular carcinoma

[Series 2: care body 4 · 1 of 1 slices shown (1 of 6)]
[im 1/1]
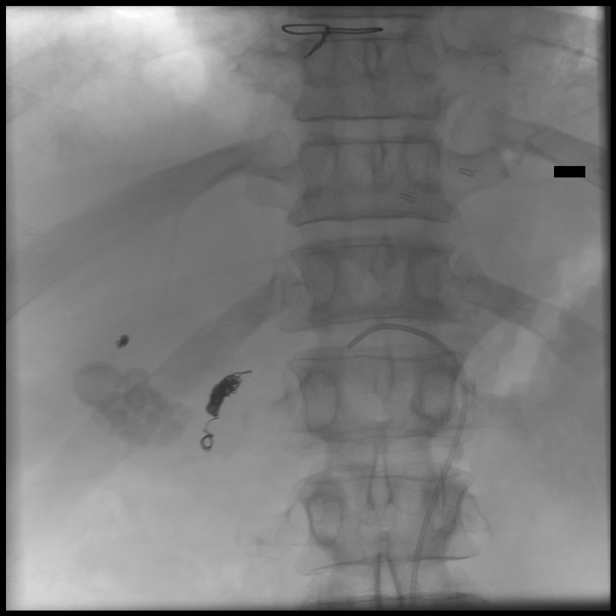

[Series 3: care body 4 · 1 of 25 frames shown (2 of 6)]
[frame 13/25]
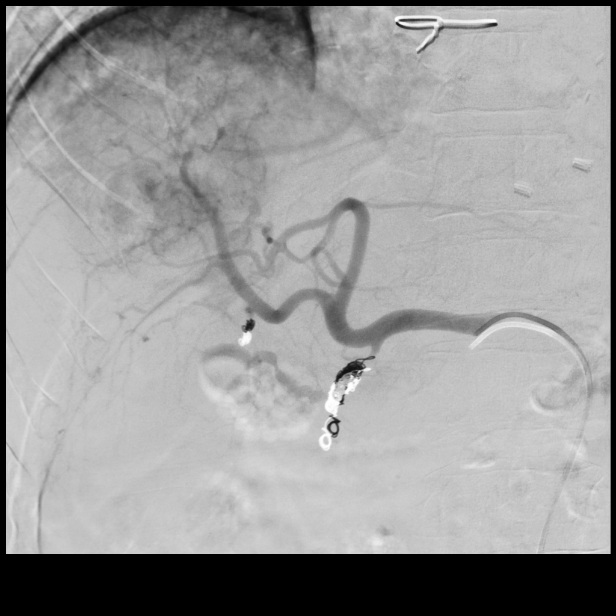

[Series 4: care body 4 · 2 of 28 frames shown (3 of 6)]
[frame 5/28]
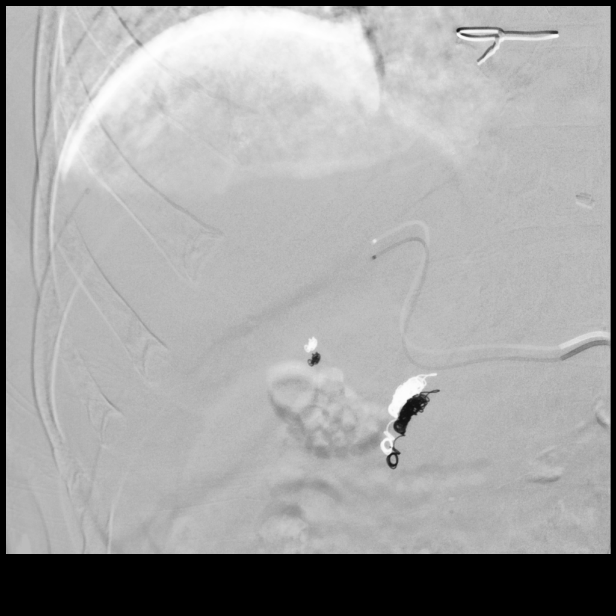
[frame 24/28]
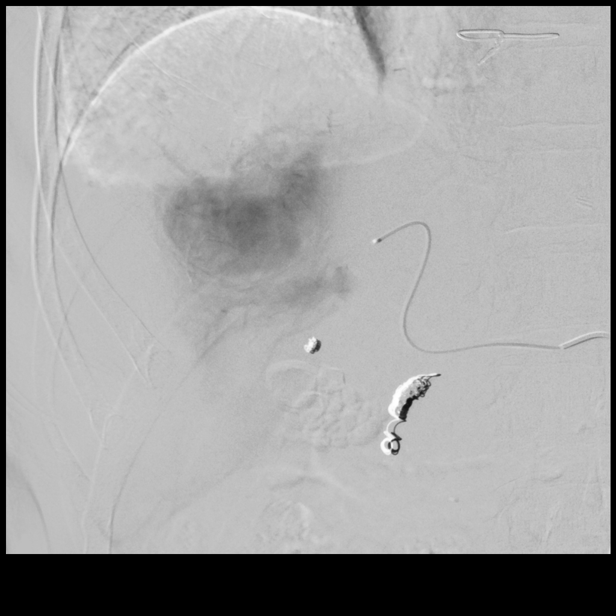

[Series 5: care body 4 · 2 of 54 frames shown (4 of 6)]
[frame 28/54]
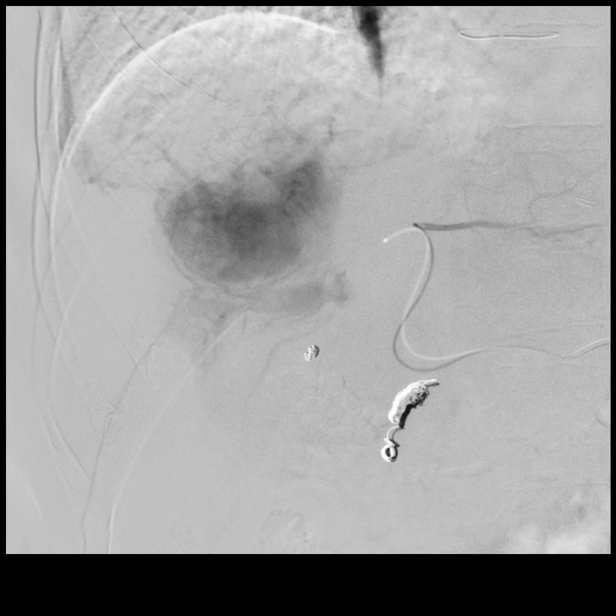
[frame 46/54]
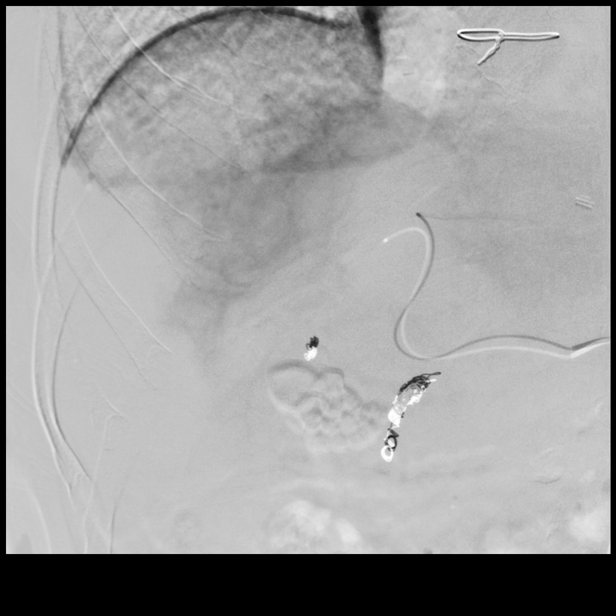

[Series 7: care body 4 · 2 of 30 frames shown (5 of 6)]
[frame 16/30]
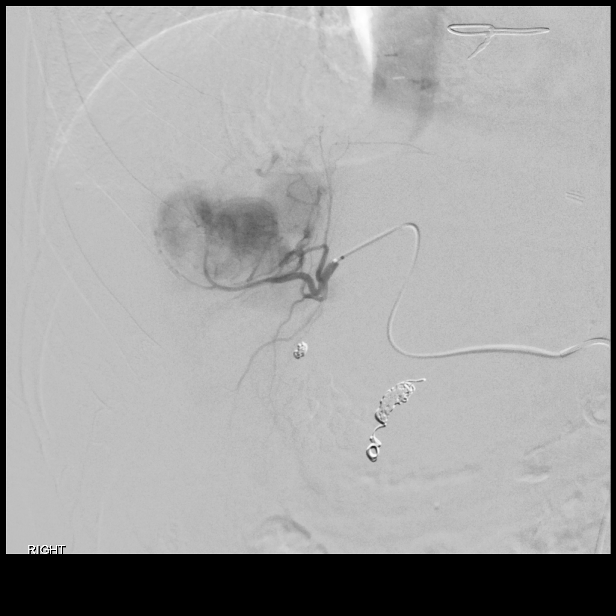
[frame 26/30]
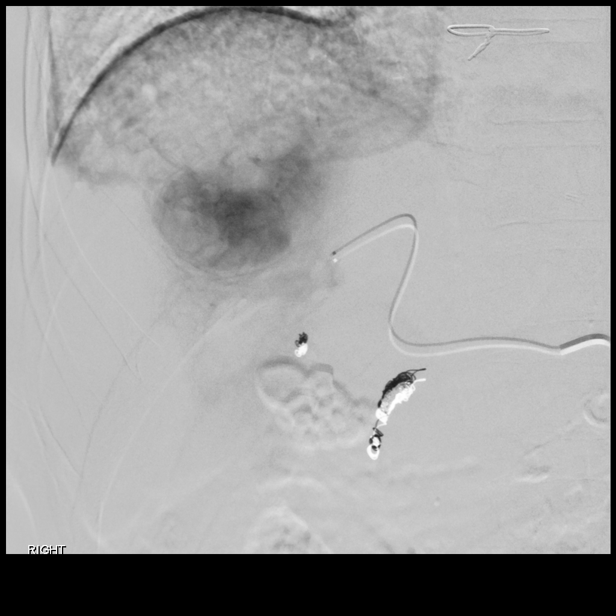

[Series 8: fl - angio · 2 of 27 frames shown (1 of 2)]
[frame 14/27]
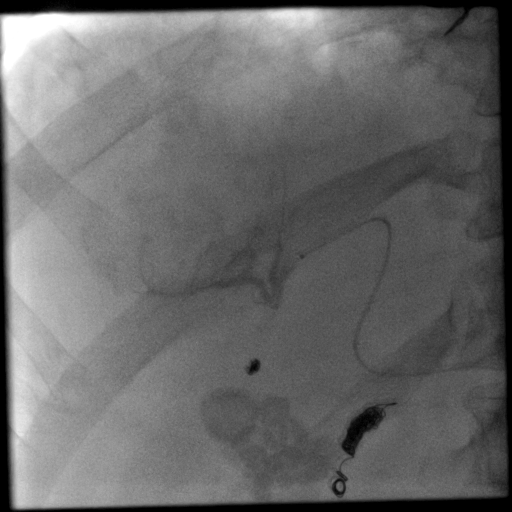
[frame 27/27]
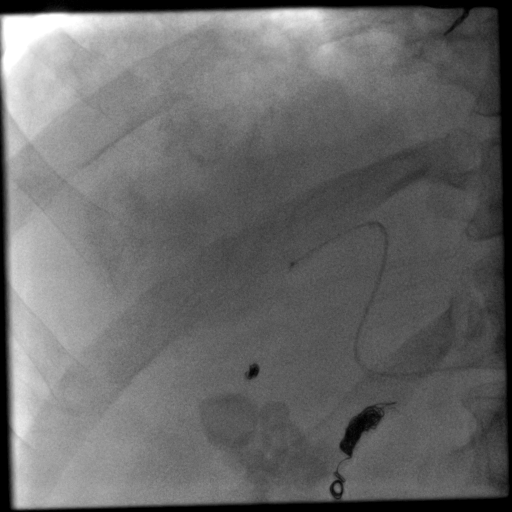

[Series 9: fl - angio · 2 of 29 frames shown (2 of 2)]
[frame 25/29]
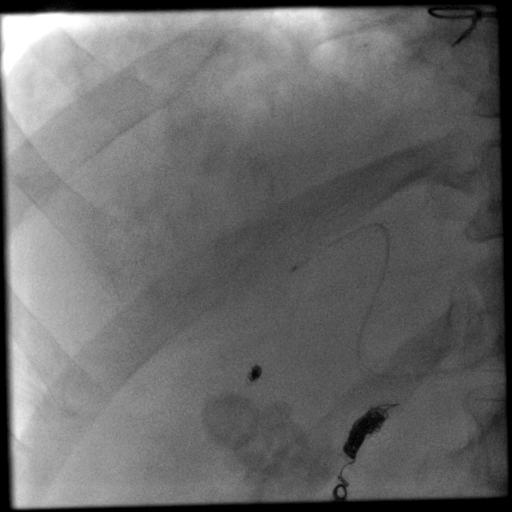
[frame 29/29]
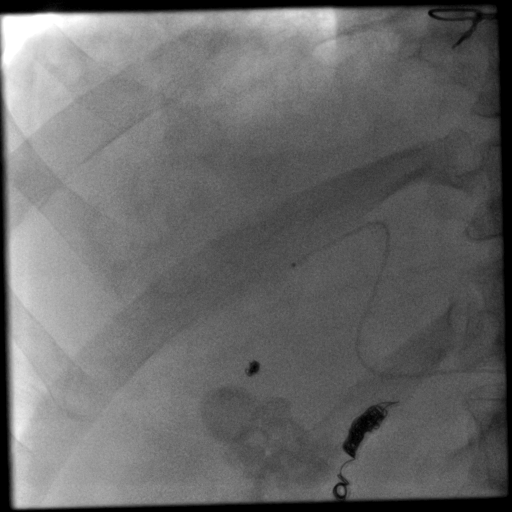

[Series 10: care body 4 · 1 of 16 frames shown (6 of 6)]
[frame 14/16]
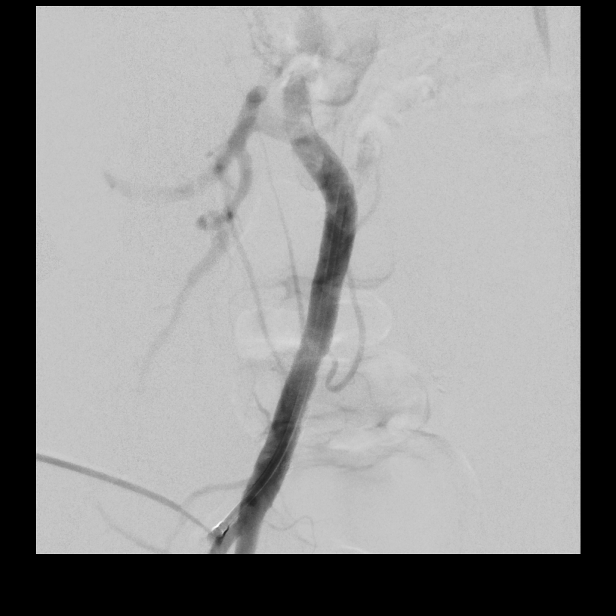

[Series 300: ir embo tumor organ ischemia infarct inc · 1 of 1 slices shown]
[im 1/1]
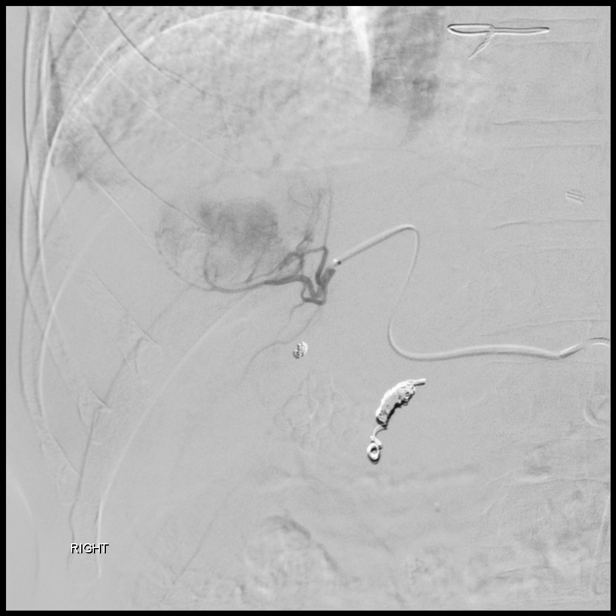

[14 of 24 positions shown; findings below may reference images not displayed]

EXAM:
Ultrasound guidance for vascular access

Celiac, common hepatic, and left hepatic catheterizations and
angiograms

Peripheral left hepatic artery micro catheterization, additional
angiography, and left hepatic [AGE] radio embolization

MEDICATIONS:
3.375 g Zosyn. The antibiotic was administered within 1 hour of the
procedure

ANESTHESIA/SEDATION:
Moderate (conscious) sedation was employed during this procedure. A
total of Versed 4.0 mg and Fentanyl 100 mcg was administered
intravenously.

Moderate Sedation Time: 41 minutes. The patient's level of
consciousness and vital signs were monitored continuously by
radiology nursing throughout the procedure under my direct
supervision.

CONTRAST:  75 cc

FLUOROSCOPY TIME:  Fluoroscopy Time: 4 minutes 48 seconds (154.1
mGy).

COMPLICATIONS:
None immediate.



Under sterile conditions and local anesthesia, ultrasound
micropuncture access performed of the right common femoral artery.
Images obtained for documentation of the patent right common femoral
artery. Five French sheath inserted over a guidewire. C2 catheter
utilized to select the celiac origin celiac angiogram performed.

Celiac: Celiac remains widely patent. Left gastric, splenic and
hepatic vasculature all patent.

Over a Glidewire, the C2 catheter was advanced into the common
hepatic artery. Common hepatic angiogram performed

Common hepatic: The common hepatic, left and right hepatic arteries
remain patent. Previous coil embolization of the cystic artery and
the gastroduodenal artery. Tumor neovascularity present as before
with delayed imaging.

Renegade high flow microcatheter over a double angled Glidewire was
advanced peripherally into the left hepatic artery. Peripheral left
hepatic angiogram performed.

Left hepatic: Left hepatic artery remains patent with tumor
vascularity and tumor blushing to the large central segment 4
hepatocellular carcinoma.

Peripheral left hepatic artery [AGE] radio embolization: From this
peripheral location, the 17 millicurie flex dose [AGE] was fully
instilled. This was done under intermittent fluoroscopy. Post
embolization angiogram confirms preserved patency of the left
hepatic vasculature. Stasis was not obtained.

Access removed. Hemostasis obtained with the ExoSeal device and
manual compression. Patient tolerated the procedure well. No
immediate complication.
IMPRESSION: Successful peripheral left hepatic [AGE] radio embolization (second
treatment).

PLAN:
Repeat outpatient MRI in 3 months.

## 2018-12-28 ENCOUNTER — Telehealth: Payer: Self-pay | Admitting: Primary Care

## 2018-12-28 NOTE — Telephone Encounter (Signed)
LMTCB  Notes recorded by Martyn Ehrich, NP on 12/24/2018 at 11:48 AM EDT  Please let patient know HRCT showed no clear evidence of interstitial lung disease. He does have emphysema d/t smoking and bronchiectasis. I started him on trelegy, can you make sure he is using still. He needs a follow up with Dr. Halford Chessman or my self in 2-4 weeks.

## 2018-12-29 ENCOUNTER — Telehealth: Payer: Self-pay | Admitting: Primary Care

## 2018-12-29 NOTE — Telephone Encounter (Signed)
Patient is calling back for results. CB [phone]

## 2018-12-29 NOTE — Telephone Encounter (Signed)
ATC patient, unable to reach, let him know that we have the result of his CT and we need to discuss further and have appointments made, if he could please call the office back.

## 2018-12-29 NOTE — Telephone Encounter (Signed)
ATC pt, line went to voicemail, LMTCB x1.  

## 2018-12-30 NOTE — Telephone Encounter (Signed)
Pt returning call for test resultsd as well as needing to sched another CT and can be reached @ [phone].Zachary Steele

## 2018-12-30 NOTE — Telephone Encounter (Signed)
Notes recorded by Martyn Ehrich, NP on 12/24/2018 at 11:48 AM EDT  Please let patient know HRCT showed no clear evidence of interstitial lung disease. He does have emphysema d/t smoking and bronchiectasis. I started him on trelegy, can you make sure he is using still. He needs a follow up with Dr. Halford Chessman or my self in 2-4 weeks.  LMTCB

## 2018-12-30 NOTE — Telephone Encounter (Signed)
Patient called office, spoke with him. He was confused if he needed to have another CT done and what the results of his recent CT were.   I let him know Beth's recs.   Patient is taking trelegy but he states it cost $45 and that is too much for him being unemployed. I told him I would ask Beth about patient assistance or if we could use our [facility] resource. I let patient know that Eustaquio Maize would not be back in office utnil Friday 12/31/18 patient voiced understanding.   Beth please advise on patient's trelegy cost  Patient scheduled for follow up with Beth on 01/12/19 per pts request

## 2018-12-31 NOTE — Telephone Encounter (Signed)
Per patient's chart, appt has been made for 9/30. Will close this encounter.

## 2018-12-31 NOTE — Telephone Encounter (Signed)
Please refer to [facility]

## 2018-12-31 NOTE — Telephone Encounter (Signed)
ATC patient unable to reach , left detailed message that we put in order for [facility] to help patient with medication. Nothing further needed at this time.

## 2019-01-03 ENCOUNTER — Other Ambulatory Visit: Payer: Self-pay | Admitting: *Deleted

## 2019-01-03 NOTE — Patient Outreach (Signed)
[name]) Care Management  01/03/2019  Zachary Steele March 10, 1951 [id number]   Referral Date: 12/31/2018 Referral Source: MD office Referral Reason: medication management medication costs too much Insurance: [name] attempt # 1, successful.  Social: Patient lives with wife, has her support as well as adult children.  Able to perform ADL's independently, does report shortness of breath intermittently with exertion (example yard work).  Conditions:  Per chart, history of PAD, CAD, COPD, cirrhosis, and CABG.  Medications:  Reviewed, report inhalers being too expensive.  Was on a more affordable one but didn't control COPD as much.    Appointments:  Active with multiple specialists.  Has GI appointment tomorrow, cardiology on 9/24, pulmonary on 9/30.  Denies need for transportation, state he will drive himself or have wife take him.  Screening complete, report being stable without complex needs.  Will not open to complex at this time but he is open to follow up from disease management.  Plan: RN CM will place referrals for health coach and [name] pharmacist.  Valente David, RN, MSN [place] Manager [phone]

## 2019-01-05 ENCOUNTER — Other Ambulatory Visit: Payer: Self-pay | Admitting: Pharmacist

## 2019-01-05 ENCOUNTER — Telehealth: Payer: Self-pay | Admitting: Primary Care

## 2019-01-05 MED ORDER — TIOTROPIUM BROMIDE MONOHYDRATE 18 MCG IN CAPS: 18.0000 ug | ORAL_CAPSULE | Freq: Every day | RESPIRATORY_TRACT | 6 refills | Status: DC

## 2019-01-05 MED ORDER — MOMETASONE FURO-FORMOTEROL FUM 200-5 MCG/ACT IN AERO: 2.0000 | INHALATION_SPRAY | Freq: Two times a day (BID) | RESPIRATORY_TRACT | 3 refills | Status: DC

## 2019-01-05 NOTE — Patient Outreach (Signed)
[facility]) Care Management  Zachary Steele   01/05/2019  Zachary Steele 1951/02/03 [id number]  Reason for referral: Medication Assistance with Trelegy  Referral source: [facility] (NP Geraldo Pitter) Current insurance: [facility]  PMHx includes but not limited to:  ILD, COPD (current tobacco abuse), ETOH, Hepatitis C, cirrhosis, hepatocellular carcinoma, emphysema.  Per notes, recently started on trial of Trelegy, then RX sent into pharmacy but patient cannot afford the $45 copay.    Outreach:  Successful telephone call with Zachary Steele.  HIPAA identifiers verified.   Subjective:  Patient reports that he cannot afford to pay for the $45 copay of Trelegy.  He does not think that he has spent $600 in out-of-pocket expenditure to qualify for [place] patient assistance program.  He thinks he may qualify for Extra Help but does not have exact income information.  He requests that I call him back later this afternoon so he has time to look for this information.  Denies having issues paying for other medications.   Objective: The ASCVD Risk score Zachary Steele DC Jr., et al., 2013) failed to calculate for the following reasons:   The valid total cholesterol range is 130 to 320 mg/dL  Lab Results  Component Value Date   CREATININE 0.90 08/26/2018   CREATININE 0.79 05/06/2018   CREATININE 0.88 02/25/2018    No results found for: HGBA1C  Lipid Panel     Component Value Date/Time   CHOL 119 05/19/2017 1010   TRIG 60 05/19/2017 1010   HDL 30 (L) 05/19/2017 1010   CHOLHDL 4.0 05/19/2017 1010   CHOLHDL 5.4 05/08/2009 0645   VLDL 15 05/08/2009 0645   LDLCALC 77 05/19/2017 1010    BP Readings from Last 3 Encounters:  11/23/18 118/70  10/04/18 (!) 160/74  08/26/18 (!) 145/79    Allergies  Allergen Reactions  . Statins Other (See Comments)    Alcoholic cirrhosis, cardiology has declined to start statin    Medications Reviewed Today    Reviewed by Martyn Ehrich, NP (Nurse Practitioner) on 11/23/18 at [place] List Status: <None>  Medication Order Taking? Sig Documenting Provider Last Dose Status Informant  acetaminophen (TYLENOL) 500 MG tablet [id number] Yes Take 1,000 mg by mouth every 6 (six) hours as needed for mild pain.  [provider] Taking Active Self  albuterol (PROVENTIL HFA;VENTOLIN HFA) 108 (90 Base) MCG/ACT inhaler [id number] Yes Inhale 2 puffs into the lungs every 6 (six) hours as needed for wheezing or shortness of breath. Chesley Mires, MD Taking Active Self  aspirin 81 MG tablet [id number] Yes Take 81 mg by mouth daily. [provider] Taking Active Self  Cholecalciferol (VITAMIN D3 PO) [id number] Yes Take 125 mcg by mouth daily. [provider] Taking Active Self  Fluticasone-Umeclidin-Vilant (TRELEGY ELLIPTA) 100-62.5-25 MCG/INH AEPB [id number]  Inhale 1 puff into the lungs daily. Martyn Ehrich, NP  Active   LORazepam (ATIVAN) 0.5 MG tablet [id number] Yes Take 1 tablet (0.5 mg) approximately 30 minutes prior to MRI. If still anxious, may take additional 1 tablet (0.5 mg). Zachary Rm, NP-C Taking Active   Multiple Vitamin (MULTIVITAMIN WITH MINERALS) TABS tablet [id number] Yes Take 1 tablet by mouth daily. [provider] Taking Active Self  pantoprazole (PROTONIX) 40 MG tablet [id number] Yes 40 mg one tab 30 minutes before breakfast meal Martyn Ehrich, NP Taking Active   Tiotropium Bromide-Olodaterol (STIOLTO RESPIMAT) 2.5-2.5 MCG/ACT AERS [id number] Yes Inhale 2 puffs into the lungs daily. Geraldo Pitter  W, NP Taking Active           Assessment: Drugs sorted by system:  Cardiovascular: aspirin 81mg   Pulmonary/Allergy: albuterol inhaler, Trelegy inhaler  Gastrointestinal: pantoprazole  Vitamins/Minerals/Supplements: vitamin D, MVI  Medication Review Findings:   Not taking albuterol inhaler, states he never received prescription for this in the past  Unable to afford  next refill of Trelegy - not in coverage gap yet, cannot afford $45 copay  May qualify for Extra Help - will need to confirm income.    If eligible, can assist patient with application.    If does not qualify, can consider changing to Spiriva and Dulera substitutions for Trelegy in order to qualify for patient assistance programs  Plan: . Will call patient back this afternoon as requested.   Ralene Bathe, PharmD, [place] [phone]  Successful f/u call to patient this afternoon.  Patient reports he and spouse make ~$2300 / month therefore he does not qualify for Extra Help program.  He is agreeable for me to contact NP Geraldo Pitter regarding possible substitution from Trelegy to [place] / Ruthe Mannan.  He reports he is not out of Trelegy yet.  We reviewed that the usual processing time for patient assistance programs in 6-8 weeks therefore he will need to pay for another co-pay or ask for samples.  Also reviewed [employer] OTC program with patient.    Plan: Contact pulmonary office regarding inhalers and possible substitutions  Ralene Bathe, PharmD, [place] [phone]

## 2019-01-05 NOTE — Telephone Encounter (Signed)
Trelegy is not covered. Recommend changing to Spiriva handihaler and Dulera. Sent in prescription- please let patient know. See [facility] note

## 2019-01-05 NOTE — Telephone Encounter (Signed)
ATC pt, line went to voicemail. I left a detailed message with Beth's message below and let him know to call our office back if he has any additional questions or concerns. Nothing further needed at this time.

## 2019-01-05 NOTE — Progress Notes (Signed)
Thank you, that sounds like the best alternative for him. I will change that

## 2019-01-06 ENCOUNTER — Other Ambulatory Visit: Payer: Self-pay | Admitting: Pharmacist

## 2019-01-06 ENCOUNTER — Ambulatory Visit: Payer: Medicare HMO | Admitting: Cardiovascular Disease

## 2019-01-06 NOTE — Patient Outreach (Signed)
[facility]) Care Management  Zachary Steele 01/06/2019  Zachary Steele 01-28-1951 [id number]  NP Geraldo Pitter ok with applying for Spiriva and [facility] patient assistance programs as patient does not qualify for Trelegy program.   Plan: I will route patient assistance letter to [place] technician who will coordinate patient assistance program application process for medications listed above.  [facility] pharmacy technician will assist with obtaining all required documents from both patient and provider(s) and submit application(s) once completed.    Ralene Bathe, PharmD, [place] [phone]

## 2019-01-07 ENCOUNTER — Other Ambulatory Visit: Payer: Self-pay | Admitting: *Deleted

## 2019-01-07 ENCOUNTER — Other Ambulatory Visit: Payer: Self-pay | Admitting: Pharmacy Technician

## 2019-01-07 NOTE — Patient Outreach (Signed)
[facility]) Care Management  01/07/2019  ZACKARIYA BIHL [birth year] [id number]                                       Medication Assistance Referral  Referral From: [place]  Medication/Company: [employer] / B-[employer] Patient application portion:  Mailed Provider application portion: Faxed  to Dr. Otho Najjar Provider address/fax verified via: Office website  Medication/Company: Ruthe Mannan and Proventil HFA / [employer] Patient application portion:  Mailed Provider application portion: Interoffice Mailed to Dr. Otho Najjar Provider address/fax verified via: Office website   Follow up:  Will follow up with patient in 7-10 business days to confirm application(s) have been received.  Maud Deed Chana Bode [place] Certified Pharmacy Technician [place] Management Direct Dial:[phone]

## 2019-01-10 ENCOUNTER — Ambulatory Visit: Payer: Medicare HMO | Admitting: Pharmacist

## 2019-01-12 ENCOUNTER — Ambulatory Visit: Payer: Medicare HMO | Admitting: Primary Care

## 2019-01-12 ENCOUNTER — Ambulatory Visit: Payer: Medicare HMO | Admitting: Cardiovascular Disease

## 2019-01-12 NOTE — Progress Notes (Deleted)
@Patient  ID: Zachary Steele, male    DOB: May 24, 1950, 68 y.o.   MRN: [id number]  No chief complaint on file.   Referring provider: [name], NP-C  HPI: 68 year old male, current some day smoker. PMH significant ILD, COPD, ETOH, hep C, cirrhosis, hepatocellular carcinoma, emphysema. Patient of Dr. Halford Chessman, seen for initial consult on 02/15/18. Quit drinking alcohol in Jan 2019. Complains of intermittent cough with clear sputum, dyspnea when walking up hills. Works with [employer]. No on any inhaler medication currently. Plan to check A1AT level and PFTs, Given RX for proair as needed. Encouraged smoking cessation. Received influenza vaccine on 02/25/18.   Previous [facility] pulmonary encounters: 04/08/2018 Patient presents today for 6 week follow-up with PFTs.  Patient states that he still gets short of breath with walking. He has been carrying his rescue inhaler with him and uses it once and a while. Has not noticed much difference with SABA.  Continues to have a dry hacking cough.  He is smoking no more than 2 cigarettes a day. Describes some intermittent lightheadedness and dizziness with activity that lasts less than a minute.  Reports it is similar feeling as a tobacco high. He states that belching makes this go away. Following with [facility] for liver cirrhosis and hep c.  Orthostatic blood pressure check today negative. Ambulatory O2 sat with no desaturation or reproducible lightheadedness.  Denies chest pain, wheezing, productive cough, hemoptysis. Started on Spiriva. Recommend FU in 4-6 weeks.  10/04/2018 Patient presents today for follow-up. Experienced shortness of breath during a recent MRI with IV contrast for cirrhosis/GI follow-up. States that he had to get out of the machine. Does not think it was a reaction to dye. He has experienced shortness of breath other times such as talking and at night. Symptoms typically resolve with rest/sitting. Belching also relieves perceived shortness  of breath. He is not taking protonix. Still smoking <1 cigarette a day, has not quit. Daily cough with some production described as light yellow/cream.  Pulmonary function testing in December showed mild obstructive lung disease with decreased DLCO. 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 Rheumatoid factor elevated and ANCA positive. Referred to [facility] rheumatology. Per chart review on 05/04/18 referral was declined by [facility] rheumatology. Had an apt 05/24/18 with medical associated which he no showed. I do not think he was aware of this appointment. Has arthritis in his hands. Issues tying bags.   11/23/2018 Patient presents today for 2 month follow-up. Patient feels his breathing has worsened. Experiences shortness of breath with slight activity. Loses his breath walking. We changed Spiriva changed to Stiolto back in June with no significant improvement. Continues to complains of upper and lower GI gas, taking protonix daily with min relief. This is embarrassing to him. He is taking protonix daily. Follows with Dr. Annamaria Boots for hepatocellular carcinoma. Will check ambulatory O2 today. He was referred to rheumatology twice for positive serology but appointment appears to have been cancelled by their office and documented as "do not schedule". This was not relayed to me and I am unclear the reason. Needs repeat HRCT in 2-4 weeks.       Pulmonary function test: 04/08/2018 - FVC 3.35 (93%), FEV1 2.31 (84%), ratio 69, DLCOcor 45%, no BD response. Curvature to flow volume loop   Imaging: 12/21/17 CT Chest w/ contrast - Diffuse bronchial wall thickening with mild centrilobular and moderate paraseptal emphysema most notably in the lung apices. Areas of  ground-glass attenuation, septal thickening, subpleural reticulation, mild cylindrical bronchiectasis and peripheral bronchiolectasis are noted in the lung bases bilaterally, most severe in the left lower lobe.  Calcified granuloma in the inferior segment of the lingula.  Labs: Alpha-1 antitrypsin- 201 (H) Rheumatoid factor- 145 (H) ANCA- positive   Allergies  Allergen Reactions   Statins Other (See Comments)    Alcoholic cirrhosis, cardiology has declined to start statin    Immunization History  Administered Date(s) Administered   Influenza, High Dose Seasonal PF 02/15/2018   Pneumococcal Polysaccharide-23 09/19/2011    Past Medical History:  Diagnosis Date   Alcohol dependence ([place])    Anemia    denies   Arthritis    Cataract    Cirrhosis ([place])    Coronary artery disease due to lipid rich plaque    Depression    Hepatitis C    Has A and B also   PAD (peripheral artery disease) ([place])    pt reports he has this and has chronic leg "soreness"   Peripheral vascular disease ([place])    essentially normal circulations with two-vessel  runoff to the feet bilaterally   Tobacco abuse     Tobacco History: Social History   Tobacco Use  Smoking Status Current Some Day Smoker   Packs/day: 0.10   Years: 52.00   Pack years: 5.20   Types: Cigarettes  Smokeless Tobacco Never Used  Tobacco Comment   Is trying to stop smoking, still has one or two per day most days   Ready to quit: Not Answered Counseling given: Not Answered Comment: Is trying to stop smoking, still has one or two per day most days   Outpatient Medications Prior to Visit  Medication Sig Dispense Refill   albuterol (PROVENTIL HFA;VENTOLIN HFA) 108 (90 Base) MCG/ACT inhaler Inhale 2 puffs into the lungs every 6 (six) hours as needed for wheezing or shortness of breath. (Patient not taking: Reported on 01/05/2019) 1 Inhaler 6   aspirin 81 MG tablet Take 81 mg by mouth daily.     Cholecalciferol (VITAMIN D3 PO) Take 125 mcg by mouth daily.     mometasone-formoterol (DULERA) 200-5 MCG/ACT AERO Inhale 2 puffs into the lungs 2 (two) times daily. 8.8 g 3   Multiple Vitamin (MULTIVITAMIN WITH MINERALS)  TABS tablet Take 1 tablet by mouth daily.     pantoprazole (PROTONIX) 40 MG tablet 40 mg one tab 30 minutes before breakfast meal 90 tablet 3   tiotropium (SPIRIVA) 18 MCG inhalation capsule Place 1 capsule (18 mcg total) into inhaler and inhale daily. 30 capsule 6   No facility-administered medications prior to visit.       Review of Systems  Review of Systems   Physical Exam  There were no vitals taken for this visit. Physical Exam   Lab Results:  CBC    Component Value Date/Time   WBC 4.5 08/26/2018 1115   WBC 4.0 05/06/2018 1213   RBC 4.72 08/26/2018 1115   HGB 15.2 08/26/2018 1115   HGB 12.9 (L) 05/19/2017 1010   HGB 12.9 (L) 11/19/2009 1342   HCT 44.7 08/26/2018 1115   HCT 37.5 05/19/2017 1010   HCT 36.6 (L) 11/19/2009 1342   PLT 106 (L) 08/26/2018 1115   PLT 90 (LL) 05/19/2017 1010   MCV 94.7 08/26/2018 1115   MCV 104 (H) 05/19/2017 1010   MCV 111.5 (H) 11/19/2009 1342   MCH 32.2 08/26/2018 1115   MCHC 34.0 08/26/2018 1115   RDW 14.1 08/26/2018 1115  RDW 13.5 05/19/2017 1010   RDW 13.1 11/19/2009 1342   LYMPHSABS 1.8 08/26/2018 1115   LYMPHSABS 1.7 05/19/2017 1010   LYMPHSABS 1.6 11/19/2009 1342   MONOABS 0.5 08/26/2018 1115   MONOABS 0.6 11/19/2009 1342   EOSABS 0.3 08/26/2018 1115   EOSABS 0.3 05/19/2017 1010   BASOSABS 0.1 08/26/2018 1115   BASOSABS 0.1 05/19/2017 1010   BASOSABS 0.0 11/19/2009 1342    BMET    Component Value Date/Time   NA 139 08/26/2018 1115   NA 140 05/19/2017 1010   K 3.9 08/26/2018 1115   CL 106 08/26/2018 1115   CO2 24 08/26/2018 1115   GLUCOSE 79 08/26/2018 1115   BUN 6 (L) 08/26/2018 1115   BUN 5 (L) 05/19/2017 1010   CREATININE 0.90 08/26/2018 1115   CALCIUM 9.6 08/26/2018 1115   GFRNONAA >60 08/26/2018 1115   GFRAA >60 08/26/2018 1115    BNP No results found for: BNP  ProBNP    Component Value Date/Time   PROBNP 78.0 04/28/2008 1637    Imaging: Ct Chest High Resolution  Result Date:  12/22/2018 CLINICAL DATA:  Interstitial lung disease, cirrhosis. Increasing shortness of breath with exertion. Cough. EXAM: CT CHEST WITHOUT CONTRAST TECHNIQUE: Multidetector CT imaging of the chest was performed following the standard protocol without intravenous contrast. High resolution imaging of the lungs, as well as inspiratory and expiratory imaging, was performed. COMPARISON:  MR abdomen 11/04/2018 and CT chest 12/21/2017 and 09/19/2011. FINDINGS: Cardiovascular: Atherosclerotic calcification of the aorta. Heart size normal. No pericardial effusion. Mediastinum/Nodes: No pathologically enlarged mediastinal lymph nodes. Calcified subcarinal and hilar lymph nodes. Subcentimeter prepericardiac lymph node. No axillary adenopathy. Esophagus is grossly unremarkable. Lungs/Pleura: Bullous emphysema. Mild bilateral lower lobe bronchiectasis/bronchiolectasis, left greater than right. No definitive subpleural reticulation, ground-glass, architectural distortion or honeycombing. Postoperative changes in the lingula, with an adjacent granuloma. No air trapping. No pleural fluid. Airway is unremarkable. Upper Abdomen: Visualized portion of the liver is decreased in attenuation diffusely and the margin is irregular. Left hepatic lobe and caudate are hypertrophied. Partially calcified lesion in the periphery of segment 4 measures approximately 2.7 cm and was better evaluated 11/04/2018. Multiple stones are seen in the gallbladder. Visualized portions of the adrenal glands, kidneys, spleen pancreas and stomach are grossly unremarkable. Upper abdominal lymph nodes are not enlarged by CT size criteria. Musculoskeletal: Old bilateral rib fractures. No worrisome lytic or sclerotic lesions. IMPRESSION: 1. Mild bilateral lower lobe bronchiectasis/bronchiolectasis may be postinfectious or post inflammatory in etiology. No definitive evidence of interstitial lung disease. 2. Cirrhosis/steatosis. Left hepatic lobe lesion, better  characterized on 11/04/2018. 3. Cholelithiasis. 4. Bullous Emphysema (ICD10-J43.9). 5.  Aortic atherosclerosis (ICD10-170.0). Electronically Signed   By: Lorin Picket M.D.   On: 12/22/2018 14:24     Assessment & Plan:   No problem-specific Assessment & Plan notes found for this encounter.     Martyn Ehrich, NP 01/12/2019

## 2019-01-17 ENCOUNTER — Other Ambulatory Visit: Payer: Self-pay

## 2019-01-17 NOTE — Patient Outreach (Signed)
[place] [facility]) Care Management  01/17/2019  DOMONIK MINERD 01-03-1951 [id number]   Telephone Assessment  Referral Date: 01/03/2019 Referral Source: [place] Coordinator Referral Reason: disease mgmt Insurance: [employer]   Outreach attempt #1 to patient. No answer. RN CM left HIPAA compliant voicemail message along with contact info.    Plan: RN CM will send unsuccessful outreach letter to patient. RN CM will make outreach attempt to patient within one month.   Enzo Montgomery, RN,BSN,CCM [place] Management Telephonic Care Management Coordinator Direct Phone: [phone] Toll Free: [phone] Fax: [phone]

## 2019-01-24 DIAGNOSIS — C220 Liver cell carcinoma: Secondary | ICD-10-CM | POA: Diagnosis not present

## 2019-01-24 DIAGNOSIS — B182 Chronic viral hepatitis C: Secondary | ICD-10-CM | POA: Diagnosis not present

## 2019-01-24 DIAGNOSIS — K7469 Other cirrhosis of liver: Secondary | ICD-10-CM | POA: Diagnosis not present

## 2019-01-25 ENCOUNTER — Other Ambulatory Visit: Payer: Self-pay | Admitting: Pharmacy Technician

## 2019-01-25 NOTE — Patient Outreach (Signed)
[place]) Care Management  01/25/2019  Zachary Steele Oct 02, 1950 [id number]    Successful call placed to patient regarding patient assistance application(s) for Dulera, Proventil HFA and Spiriva , HIPAA identifiers verified. [name] states that he has not received patient assistance applications in the mail.  Follow up:  Will follow up with patient in 5-7 business days to verfiy applications have been received.  Maud Deed Chana Bode [place] Certified Pharmacy Technician [place] Management Direct Dial:[phone]

## 2019-01-26 ENCOUNTER — Other Ambulatory Visit: Payer: Self-pay

## 2019-01-26 ENCOUNTER — Other Ambulatory Visit: Payer: Self-pay | Admitting: Nurse Practitioner

## 2019-01-26 ENCOUNTER — Ambulatory Visit (INDEPENDENT_AMBULATORY_CARE_PROVIDER_SITE_OTHER): Payer: Medicare HMO | Admitting: Cardiovascular Disease

## 2019-01-26 ENCOUNTER — Encounter: Payer: Self-pay | Admitting: Cardiovascular Disease

## 2019-01-26 VITALS — BP 139/87 | HR 76 | Temp 97.2°F | Ht 67.0 in | Wt 146.0 lb

## 2019-01-26 DIAGNOSIS — D751 Secondary polycythemia: Secondary | ICD-10-CM

## 2019-01-26 DIAGNOSIS — K7469 Other cirrhosis of liver: Secondary | ICD-10-CM

## 2019-01-26 DIAGNOSIS — F17200 Nicotine dependence, unspecified, uncomplicated: Secondary | ICD-10-CM

## 2019-01-26 DIAGNOSIS — Z951 Presence of aortocoronary bypass graft: Secondary | ICD-10-CM | POA: Diagnosis not present

## 2019-01-26 DIAGNOSIS — Z008 Encounter for other general examination: Secondary | ICD-10-CM | POA: Diagnosis not present

## 2019-01-26 DIAGNOSIS — C220 Liver cell carcinoma: Secondary | ICD-10-CM

## 2019-01-26 NOTE — Assessment & Plan Note (Signed)
The status post CABG x3 in 2010 with a LIMA to the LAD, vein to the circumflex and RCA with a negative Myoview in 2012.  Patient does complain of some shortness of breath probably related to COPD but denies chest pain.

## 2019-01-26 NOTE — Assessment & Plan Note (Signed)
History of ongoing tobacco abuse with COPD

## 2019-01-26 NOTE — Patient Instructions (Signed)

## 2019-01-26 NOTE — Progress Notes (Signed)
01/26/2019 Zachary Steele   1950-04-21  [id number]  Primary Physician Zachary Rm, NP-C Primary Cardiologist: Lorretta Harp MD Zachary Steele, [place]  HPI:  Zachary Steele is a 68 y.o.  thin appearing married African-American male father of one child who currently is out of work.  His primary care provider is Zachary Hook, NP. He was referred initially by the emergency room for evaluation of peripheral arterial disease. He was remotely a patient of Dr. Shanon Brow Harding's, who last saw him in the office 09/13/14.  I last saw him in the office 06/12/2017 he has a history of tobacco abuse only smoking 1-4 cigars a day. He also has a history of polysubstance abuse including marijuana and alcohol cirrhosis alcohol cirrhosis.Marland Kitchen He has a history of coronary artery disease status post bypass grafting January 2010 with Myoview performed 11/15/10 was nonischemic. He did have a partially Dopplers performed in our office 01/03/11 showed normal ABIs with tibial vessel disease. He complained of lower extremity discomfort which is somewhat atypical for claudication along with numbness. He is not a diabetic. Marland Kitchen Doppler studies that showed tibial vessel disease without significant obstructive disease otherwise. Also gives symptoms compatible with restless leg syndrome. He apparently has stopped drinking alcohol and smokes minimally.  Since I saw him a year and a half ago he continues to have shortness of breath probably related to his COPD peers being treated for his cirrhosis as well.  He has stopped drinking but continues to smoke.  Current Meds  Medication Sig  . albuterol (PROVENTIL HFA;VENTOLIN HFA) 108 (90 Base) MCG/ACT inhaler Inhale 2 puffs into the lungs every 6 (six) hours as needed for wheezing or shortness of breath.  Marland Kitchen aspirin 81 MG tablet Take 81 mg by mouth daily.  . Cholecalciferol (VITAMIN D3 PO) Take 125 mcg by mouth daily.  . mometasone-formoterol (DULERA) 200-5 MCG/ACT AERO Inhale 2 puffs  into the lungs 2 (two) times daily.  . Multiple Vitamin (MULTIVITAMIN WITH MINERALS) TABS tablet Take 1 tablet by mouth daily.  . pantoprazole (PROTONIX) 40 MG tablet 40 mg one tab 30 minutes before breakfast meal  . tiotropium (SPIRIVA) 18 MCG inhalation capsule Place 1 capsule (18 mcg total) into inhaler and inhale daily.     Allergies  Allergen Reactions  . Statins Other (See Comments)    Alcoholic cirrhosis, cardiology has declined to start statin    Social History   Socioeconomic History  . Marital status: Married    Spouse name: Not on file  . Number of children: 1  . Years of education: Not on file  . Highest education level: Not on file  Occupational History  . Occupation: Retail buyer    Comment: city of [place]  . Financial resource strain: Not on file  . Food insecurity    Worry: Not on file    Inability: Not on file  . Transportation needs    Medical: Not on file    Non-medical: Not on file  Tobacco Use  . Smoking status: Current Some Day Smoker    Packs/day: 0.10    Years: 52.00    Pack years: 5.20    Types: Cigarettes  . Smokeless tobacco: Never Used  . Tobacco comment: Is trying to stop smoking, still has one or two per day most days  Substance and Sexual Activity  . Alcohol use: No    Frequency: Never    Comment: quit 04/2017; previous 40 year history  1 wine bottle per day  . Drug use: No    Comment: occ  . Sexual activity: Yes  Lifestyle  . Physical activity    Days per week: Not on file    Minutes per session: Not on file  . Stress: Not on file  Relationships  . Social Herbalist on phone: Not on file    Gets together: Not on file    Attends religious service: Not on file    Active member of club or organization: Not on file    Attends meetings of clubs or organizations: Not on file    Relationship status: Not on file  . Intimate partner violence    Fear of current or ex partner: Not on file    Emotionally  abused: Not on file    Physically abused: Not on file    Forced sexual activity: Not on file  Other Topics Concern  . Not on file  Social History Narrative  . Not on file     Review of Systems: General: negative for chills, fever, night sweats or weight changes.  Cardiovascular: negative for chest pain, dyspnea on exertion, edema, orthopnea, palpitations, paroxysmal nocturnal dyspnea or shortness of breath Dermatological: negative for rash Respiratory: negative for cough or wheezing Urologic: negative for hematuria Abdominal: negative for nausea, vomiting, diarrhea, bright red blood per rectum, melena, or hematemesis Neurologic: negative for visual changes, syncope, or dizziness All other systems reviewed and are otherwise negative except as noted above.    Blood pressure 139/87, pulse 76, temperature (!) 97.2 F (36.2 C), height 5\' 7"  (1.702 m), weight 146 lb (66.2 kg), SpO2 93 %.  General appearance: alert and no distress Neck: no adenopathy, no carotid bruit, no JVD, supple, symmetrical, trachea midline and thyroid not enlarged, symmetric, no tenderness/mass/nodules Lungs: clear to auscultation bilaterally Heart: regular rate and rhythm, S1, S2 normal, no murmur, click, rub or gallop Extremities: extremities normal, atraumatic, no cyanosis or edema Pulses: 2+ and symmetric Skin: Skin color, texture, turgor normal. No rashes or lesions Neurologic: Alert and oriented X 3, normal strength and tone. Normal symmetric reflexes. Normal coordination and gait  EKG sinus rhythm at 70 with nonspecific ST and T wave changes.  I personally reviewed this EKG  ASSESSMENT AND PLAN:   Hx of CABG The status post CABG x3 in 2010 with a LIMA to the LAD, vein to the circumflex and RCA with a negative Myoview in 2012.  Patient does complain of some shortness of breath probably related to COPD but denies chest pain.  Smoker History of ongoing tobacco abuse with COPD      Lorretta Harp  MD [facility], [facility] 01/26/2019 4:55 PM

## 2019-01-27 ENCOUNTER — Ambulatory Visit: Payer: Medicare HMO | Admitting: *Deleted

## 2019-02-01 ENCOUNTER — Telehealth: Payer: Self-pay | Admitting: Hematology

## 2019-02-01 NOTE — Telephone Encounter (Signed)
YF PAL 10/30 moved appointment to 11/6. Left message. Schedule mailed.

## 2019-02-03 ENCOUNTER — Other Ambulatory Visit: Payer: Self-pay | Admitting: Pharmacy Technician

## 2019-02-03 NOTE — Patient Outreach (Signed)
[facility]) Care Management  02/03/2019  Zachary Steele February 17, 1951 [id number]    Unsuccessful call placed to patient regarding patient assistance application(s) for Proventil HFA, Dulera and Spiriva  , HIPAA compliant voicemail left.   Follow up:  Will make additional call attempt in 3-5 business days if call has not been returned.  Maud Deed Chana Bode [place] Certified Pharmacy Technician [place] Management Direct Dial:[phone]

## 2019-02-07 ENCOUNTER — Other Ambulatory Visit: Payer: Self-pay

## 2019-02-07 NOTE — Patient Outreach (Signed)
[place] [facility]) Care Management  02/07/2019  Zachary Steele [birth year] [id number]   Telephone Assessment  Referral Date: 01/03/2019 Referral Source: [place] Coordinator Referral Reason: disease mgmt Insurance: [employer]   Outreach attempt #2 to patient. No answer. RN CM left HIPAA compliant voicemail message along with contact info.    Plan: RN CM will make outreach attempt to patient in the month of November.  Enzo Montgomery, RN,BSN,CCM [place] Management Telephonic Care Management Coordinator Direct Phone: [phone] Toll Free: [phone] Fax: [phone]

## 2019-02-08 ENCOUNTER — Ambulatory Visit: Payer: Self-pay

## 2019-02-11 ENCOUNTER — Other Ambulatory Visit: Payer: Medicare HMO

## 2019-02-11 ENCOUNTER — Ambulatory Visit: Payer: Medicare HMO | Admitting: Hematology

## 2019-02-15 ENCOUNTER — Other Ambulatory Visit: Payer: Self-pay | Admitting: Pharmacy Technician

## 2019-02-15 NOTE — Patient Outreach (Signed)
[facility] [facility] Care Management  02/15/2019  Zachary Steele 11/30/1950 [id number]    Unsuccessful call placed to patient regarding patient assistance application(s) for Dulera, Proventil HFA and Spiriva , HIPAA compliant voicemail left.   Follow up:  Will route note to [place] for case closure if call has not been returned in 3 business days due to inability to engage patient.  Maud Deed Chana Bode [place] Certified Pharmacy Technician [place] Management Direct Dial:[phone]

## 2019-02-17 NOTE — Progress Notes (Signed)
[place]   [phone] [phone]   Clinic Follow up Note   Patient Care Team: [name], NP-C as PCP - General (Family Medicine) Milus Banister, MD as Attending Physician (Gastroenterology) Ilean China as Physician Assistant (Cardiology) Prescott Gum, Collier [place], MD as Consulting Physician (Cardiothoracic Surgery) Greggory Keen, MD as Consulting Physician (Interventional Radiology) Rudean Haskell, [facility] as [place] Management (Pharmacist) Tania Ade, Tomasa Blase, RN as [place] Management  Date of Service:  02/18/2019  CHIEF COMPLAINT:  f/u [place]    SUMMARY OF ONCOLOGIC HISTORY: Oncology History Overview Note  Cancer Staging Hepatocellular carcinoma [facility]) Staging form: Liver, AJCC 8th Edition - Clinical stage from 07/25/2017: Stage IIIA (cT3, cN0, cM0) - Signed by Truitt Merle, MD on 08/26/2017     Hepatocellular carcinoma ([place])  07/2017 Tumor Marker   AFP: 2431   07/15/2017 Imaging   IMPRESSION: ULTRASOUND ABDOMEN: Multiple hypoechoic liver masses, suspicious for multifocal hepatoma or hepatic metastatic disease. Abdomen MRI without and with contrast is recommended for further characterization. Cholelithiasis. No sonographic signs of acute cholecystitis or biliary dilatation. These results will be called to the ordering clinician or representative by the Radiologist Assistant, and communication documented in the PACS or zVision Dashboard.  ULTRASOUND HEPATIC ELASTOGRAPHY: Median hepatic shear wave velocity is calculated at 2.52 m/sec. Corresponding Metavir fibrosis score is Some F3 + F4. Risk of fibrosis is High. Follow-up: Follow up advised   07/25/2017 Imaging   IMPRESSION: At least 4 separate hepatic masses with characteristics of multifocal hepatoma, largest measuring 5.5 cm. No evidence of abdominal metastatic disease. Cholelithiasis.  No radiographic evidence of  cholecystitis. Incidentally noted congenital small bowel malrotation. No evidence of volvulus or bowel obstruction.    07/25/2017 Cancer Staging   Staging form: Liver, AJCC 8th Edition - Clinical stage from 07/25/2017: Stage IIIA (cT3, cN0, cM0) - Signed by Truitt Merle, MD on 08/26/2017   08/25/2017 Initial Diagnosis   Hepatocellular carcinoma ([place])   11/06/2017 Procedure   peripheral right hepatic artery Y 90 radio embolization   12/04/2017 Procedure   peripheral left hepatic Y 90 radio embolization   12/21/2017 Imaging   CT Chest W Contrast 12/21/17  IMPRESSION: 1. 4.8 x 5.9 x 6.7 cm heterogeneously enhancing hepatic mass compatible with the reported clinical history of hepatocellular carcinoma. No definite metastatic disease noted in the thorax, although there is a slightly prominent anterior mediastinal lymph node measuring 7 mm in short axis in the juxta pericardiac nodal station. This is nonspecific but warrants attention on follow-up studies. 2. Mild diffuse bronchial wall thickening with mild centrilobular and moderate paraseptal emphysema; imaging findings suggestive of underlying COPD. 3. In addition, there is evidence of probable interstitial lung disease in the lung bases. Outpatient referral to Pulmonology for further evaluation could be considered if clinically appropriate. 4. Aortic atherosclerosis, in addition to left main and 3 vessel coronary artery disease. Status post median sternotomy for CABG including LIMA to the LAD. Aortic Atherosclerosis (ICD10-I70.0) and Emphysema (ICD10-J43.9).   04/20/2018 Imaging   MRI Abdomen  IMPRESSION: 1. Mixed response to therapy. Dominant segment 4A left liver lobe tumor is decreased in size but demonstrates persistent thick peripheral enhancement indicating viable tumor. Inferior right liver lobe 3.0 cm mass is increased in size and demonstrates persistent avid arterial enhancement indicating viable tumor. Two additional smaller  liver masses have decreased in size. One new subcentimeter segment 3 left liver lobe mass suspicious for new site of hepatocellular carcinoma.  2. Hepatic cirrhosis.  Normal size spleen.  No ascites. 3. No abdominal lymphadenopathy. 4. Cholelithiasis.   05/06/2018 Procedure   05/06/18 right hepatic bland embolization by Dr. Annamaria Boots    11/04/2018 Imaging   MRI Abdomen 11/04/18  IMPRESSION: 1. Stable cirrhotic changes involving the liver along with fairly diffuse fatty infiltration. 2. Interval decrease in size of the segment 4B liver lesion when compared to prior studies. There is a persistent rim of enhancement around the lesion but I do not see any nodularity or mass effect to suggest this is tumor. It certainly could be enhancing granulation tissue or inflammatory change. Recommend continued surveillance. 3. Slight interval enlargement of two segment III early arterial phase enhancing nodules which could be dysplastic nodules or small HCC's. A third nodule in segment 4B is stable at 7 mm. 4. Cholelithiasis without sonographic findings for acute cholecystitis. Normal common bile duct. 5. No abdominal lymphadenopathy.      CURRENT THERAPY:  Observation  INTERVAL HISTORY:  Zachary Steele is here for a follow up of liver cancer. He presents to the clinic alone. He notes he has been having dizzy spells but this is not new to him and he has had thi for years with flares on and off. He notes he was seen for his breathing and was given an inhaler. When he stands up or fast moving activities he feels a rush of lightheadedness so he sits. After sitting this goes away. He denies sweating or chest discomfort when this occurs. He notes he saw Dr. Annamaria Boots in 11/2018 after his 10/2018 scan. He notes concern for having another MRI because it makes him have panic attack. He needed medication before doing it. He notes he He notes he smokes few cigarettes a day but not completely quit. He is considering  using nicotine patch. He notes he continues alcohol cessation.     REVIEW OF SYSTEMS:   Constitutional: Denies fevers, chills or abnormal weight loss (+) Dizzy spells Eyes: Denies blurriness of vision Ears, nose, mouth, throat, and face: Denies mucositis or sore throat Respiratory: Denies cough, dyspnea or wheezes Cardiovascular: Denies palpitation, chest discomfort or lower extremity swelling Gastrointestinal:  Denies nausea, heartburn or change in bowel habits Skin: Denies abnormal skin rashes Lymphatics: Denies new lymphadenopathy or easy bruising Neurological:Denies numbness, tingling or new weaknesses Behavioral/Psych: Mood is stable, no new changes  All other systems were reviewed with the patient and are negative.  MEDICAL HISTORY:  Past Medical History:  Diagnosis Date  . Alcohol dependence ([place])   . Anemia    denies  . Arthritis   . Cataract   . Cirrhosis ([place])   . Coronary artery disease due to lipid rich plaque   . Depression   . Hepatitis C    Has A and B also  . PAD (peripheral artery disease) ([place])    pt reports he has this and has chronic leg "soreness"  . Peripheral vascular disease ([place])    essentially normal circulations with two-vessel  runoff to the feet bilaterally  . Tobacco abuse     SURGICAL HISTORY: Past Surgical History:  Procedure Laterality Date  . CARDIAC CATHETERIZATION    . COLONOSCOPY    . CORONARY ARTERY BYPASS GRAFT  2010   Triple bypass at [facility]  . DOPPLER ECHOCARDIOGRAPHY  2011  . IR ANGIOGRAM SELECTIVE EACH ADDITIONAL VESSEL  10/27/2017  . IR ANGIOGRAM SELECTIVE EACH ADDITIONAL VESSEL  10/27/2017  . IR ANGIOGRAM SELECTIVE EACH ADDITIONAL VESSEL  10/27/2017  .  IR ANGIOGRAM SELECTIVE EACH ADDITIONAL VESSEL  10/27/2017  . IR ANGIOGRAM SELECTIVE EACH ADDITIONAL VESSEL  10/27/2017  . IR ANGIOGRAM SELECTIVE EACH ADDITIONAL VESSEL  10/27/2017  . IR ANGIOGRAM SELECTIVE EACH ADDITIONAL VESSEL  10/27/2017  . IR ANGIOGRAM SELECTIVE EACH ADDITIONAL  VESSEL  11/06/2017  . IR ANGIOGRAM SELECTIVE EACH ADDITIONAL VESSEL  11/06/2017  . IR ANGIOGRAM SELECTIVE EACH ADDITIONAL VESSEL  11/06/2017  . IR ANGIOGRAM SELECTIVE EACH ADDITIONAL VESSEL  12/04/2017  . IR ANGIOGRAM SELECTIVE EACH ADDITIONAL VESSEL  12/04/2017  . IR ANGIOGRAM SELECTIVE EACH ADDITIONAL VESSEL  05/06/2018  . IR ANGIOGRAM SELECTIVE EACH ADDITIONAL VESSEL  05/06/2018  . IR ANGIOGRAM SELECTIVE EACH ADDITIONAL VESSEL  05/06/2018  . IR ANGIOGRAM SELECTIVE EACH ADDITIONAL VESSEL  05/06/2018  . IR ANGIOGRAM VISCERAL SELECTIVE  10/27/2017  . IR ANGIOGRAM VISCERAL SELECTIVE  11/06/2017  . IR ANGIOGRAM VISCERAL SELECTIVE  12/04/2017  . IR ANGIOGRAM VISCERAL SELECTIVE  05/06/2018  . IR EMBO ARTERIAL NOT HEMORR HEMANG INC GUIDE ROADMAPPING  10/27/2017  . IR EMBO TUMOR ORGAN ISCHEMIA INFARCT INC GUIDE ROADMAPPING  11/06/2017  . IR EMBO TUMOR ORGAN ISCHEMIA INFARCT INC GUIDE ROADMAPPING  12/04/2017  . IR EMBO TUMOR ORGAN ISCHEMIA INFARCT INC GUIDE ROADMAPPING  05/06/2018  . IR RADIOLOGIST EVAL & MGMT  08/12/2017  . IR RADIOLOGIST EVAL & MGMT  09/22/2017  . IR RADIOLOGIST EVAL & MGMT  01/07/2018  . IR RADIOLOGIST EVAL & MGMT  04/20/2018  . IR RADIOLOGIST EVAL & MGMT  11/18/2018  . IR US GUIDE VASC ACCESS RIGHT  10/27/2017  . IR US GUIDE VASC ACCESS RIGHT  11/06/2017  . IR US GUIDE VASC ACCESS RIGHT  12/04/2017  . IR US GUIDE VASC ACCESS RIGHT  05/06/2018  . NM MYOVIEW LTD  2012    I have reviewed the social history and family history with the patient and they are unchanged from previous note.  ALLERGIES:  is allergic to statins.  MEDICATIONS:  Current Outpatient Medications  Medication Sig Dispense Refill  . albuterol (PROVENTIL HFA;VENTOLIN HFA) 108 (90 Base) MCG/ACT inhaler Inhale 2 puffs into the lungs every 6 (six) hours as needed for wheezing or shortness of breath. 1 Inhaler 6  . aspirin 81 MG tablet Take 81 mg by mouth daily.    . Cholecalciferol (VITAMIN D3 PO) Take 125 mcg by mouth daily.    .  mometasone-formoterol (DULERA) 200-5 MCG/ACT AERO Inhale 2 puffs into the lungs 2 (two) times daily. 8.8 g 3  . Multiple Vitamin (MULTIVITAMIN WITH MINERALS) TABS tablet Take 1 tablet by mouth daily.    . pantoprazole (PROTONIX) 40 MG tablet 40 mg one tab 30 minutes before breakfast meal 90 tablet 3  . tiotropium (SPIRIVA) 18 MCG inhalation capsule Place 1 capsule (18 mcg total) into inhaler and inhale daily. 30 capsule 6  . LORazepam (ATIVAN) 0.5 MG tablet Take 1 tablet (0.5 mg) approximately 30 minutes prior to MRI. If still anxious, may take additional 1 tablet (0.5 mg). 2 tablet 0   No current facility-administered medications for this visit.     PHYSICAL EXAMINATION: ECOG PERFORMANCE STATUS: 0 - Asymptomatic  Vitals:   02/18/19 1245  BP: (!) 149/83  Pulse: 72  Resp: 18  Temp: 98 F (36.7 C)  SpO2: 100%   Filed Weights   02/18/19 1245  Weight: 147 lb (66.7 kg)    GENERAL:alert, no distress and comfortable SKIN: skin color, texture, turgor are normal, no rashes or significant lesions EYES: normal, Conjunctiva are pink  and non-injected, sclera clear  NECK: supple, thyroid normal size, non-tender, without nodularity LYMPH:  no palpable lymphadenopathy in the cervical, axillary  LUNGS: clear to auscultation and percussion with normal breathing effort HEART: regular rate & rhythm and no murmurs and no lower extremity edema ABDOMEN:abdomen soft, non-tender and normal bowel sounds Musculoskeletal:no cyanosis of digits and no clubbing  NEURO: alert & oriented x 3 with fluent speech, no focal motor/sensory deficits  LABORATORY DATA:  I have reviewed the data as listed CBC Latest Ref Rng & Units 02/18/2019 08/26/2018 05/06/2018  WBC 4.0 - 10.5 K/uL 4.8 4.5 4.0  Hemoglobin 13.0 - 17.0 g/dL 13.5 15.2 13.1  Hematocrit 39.0 - 52.0 % 36.6(L) 44.7 40.9  Platelets 150 - 400 K/uL 57(L) 106(L) 103(L)     CMP Latest Ref Rng & Units 02/18/2019 08/26/2018 05/06/2018  Glucose 70 - 99 mg/dL  127(H) 79 89  BUN 8 - 23 mg/dL <4(L) 6(L) 7(L)  Creatinine 0.61 - 1.24 mg/dL 0.77 0.90 0.79  Sodium 135 - 145 mmol/L 135 139 138  Potassium 3.5 - 5.1 mmol/L 3.6 3.9 3.7  Chloride 98 - 111 mmol/L 101 106 106  CO2 22 - 32 mmol/L 24 24 23   Calcium 8.9 - 10.3 mg/dL 8.3(L) 9.6 9.4  Total Protein 6.5 - 8.1 g/dL 7.6 8.6(H) 8.0  Total Bilirubin 0.3 - 1.2 mg/dL 2.2(H) 0.7 0.9  Alkaline Phos 38 - 126 U/L 158(H) 125 107  AST 15 - 41 U/L 65(H) 45(H) 42(H)  ALT 0 - 44 U/L 23 28 22       RADIOGRAPHIC STUDIES: I have personally reviewed the radiological images as listed and agreed with the findings in the report. No results found.   ASSESSMENT & PLAN:  Zachary Steele is a 68 y.o. male with   1. Hepatocellular Carcinoma, multifocal, cT3N0Mx -He was diagnosed in 08/2017 given he has at least 4 separate hepatic masses with the largest measuring 5.5 cm with typical imaging features of HCC. AFP is elevated to 2431;this is diagnostic forhepatocellular carcinoma. -Due to the size and multifocal disease he is not a candidate for liver transplant or resection.His liver cancer is not curableat this stage, but still treatable.  -He underwentbilateral hepatic arteryY90 treatments on 11/06/17 and 12/04/17 with Dr. Annamaria Boots. He tolerated the procedure very well but had mixed response.  -On 05/06/18 he underwent right hepatic bland embolization by Dr. Annamaria Boots  -His 10/2018 liver MRI showed overall stable disease. A few new small liver lesion are indeterminate (dysplatic nodule vs small HCC), will continue monitoring  -we again discussed if he has disease progression, I plan to start him on systemic therapy. He is clinically doing very well, asymptomatic  -He is currently on observation. He is clinically doing well. Labs reviewed, he has thrombocytopenia. I recommend he start OTC B12 and Folate. Physical exam unremarkable.  -He was last seen by Dr Annamaria Boots in 11/2018 and recommended to have another scan in 3-6 months.  Plan to have scan in early 2021.  -Pt needs medication before MRIs given his panic attacks. I will call in Ativan today to take 30 minutes before scan.  -F/u in 3 months    2. Cirrhosis, Child-Pugh class A, Chronic Hepatitis C -He is followed by PCP and [place] at liver care. Will defer hep C treatment until after [facility] treatment -She recommends Hep A and Hep B vaccination series -11/2017 Endoscopy shows small esophogeal varices, no gastric varices. Plan to repeat in 11/2019. -We recommend he continue to abstain  from alcohol to prevent further liver damage and try to quit smoking.    3. COPD, Smoking Cessation, Cough  -He has some intermittent SOB and cough, likely related to his smoking. PCP started him on inhaler -His PCP started him on inhaler as needed for SOB. He has developed a cough.  -He has reduced down to a few cigarettes a day, I again encouraged him to complete cessation.   4. Chronic intermittent dizzy spells -He notes he recently having dizzy spells again when standing up or during fast moving activities. He notes it feels like a head rush.  -He has not had falls but will break or sit down as needed.    PLAN: -He is clinically doing well, will continue observation for now  -F/u in 3 months with lab and MRI abdomen a few days before   No problem-specific Assessment & Plan notes found for this encounter.   Orders Placed This Encounter  Procedures  . MR Abdomen W Wo Contrast    Standing Status:   Future    Standing Expiration Date:   04/19/2020    Order Specific Question:   If indicated for the ordered procedure, I authorize the administration of contrast media per Radiology protocol    Answer:   Yes    Order Specific Question:   What is the patient's sedation requirement?    Answer:   No Sedation    Order Specific Question:   Does the patient have a pacemaker or implanted devices?    Answer:   No    Order Specific Question:   Radiology Contrast Protocol - do  NOT remove file path    Answer:   [email or web address]    Order Specific Question:   Preferred imaging location?    Answer:   [facility] (table limit-350 lbs)   All questions were answered. The patient knows to call the clinic with any problems, questions or concerns. No barriers to learning was detected. I spent 20 minutes counseling the patient face to face. The total time spent in the appointment was 25 minutes and more than 50% was on counseling and review of test results     Truitt Merle, MD 02/18/2019   I, Joslyn Devon, am acting as scribe for Truitt Merle, MD.   I have reviewed the above documentation for accuracy and completeness, and I agree with the above.

## 2019-02-18 ENCOUNTER — Telehealth: Payer: Self-pay | Admitting: Hematology

## 2019-02-18 ENCOUNTER — Encounter: Payer: Self-pay | Admitting: Hematology

## 2019-02-18 ENCOUNTER — Inpatient Hospital Stay (HOSPITAL_BASED_OUTPATIENT_CLINIC_OR_DEPARTMENT_OTHER): Payer: Medicare HMO | Admitting: Hematology

## 2019-02-18 ENCOUNTER — Inpatient Hospital Stay: Payer: Medicare HMO | Attending: Hematology | Admitting: *Deleted

## 2019-02-18 ENCOUNTER — Other Ambulatory Visit: Payer: Self-pay

## 2019-02-18 VITALS — BP 149/83 | HR 72 | Temp 98.0°F | Resp 18 | Ht 67.0 in | Wt 147.0 lb

## 2019-02-18 DIAGNOSIS — R42 Dizziness and giddiness: Secondary | ICD-10-CM | POA: Insufficient documentation

## 2019-02-18 DIAGNOSIS — Z8659 Personal history of other mental and behavioral disorders: Secondary | ICD-10-CM | POA: Diagnosis not present

## 2019-02-18 DIAGNOSIS — Z7289 Other problems related to lifestyle: Secondary | ICD-10-CM | POA: Diagnosis not present

## 2019-02-18 DIAGNOSIS — C220 Liver cell carcinoma: Secondary | ICD-10-CM | POA: Insufficient documentation

## 2019-02-18 DIAGNOSIS — F17290 Nicotine dependence, other tobacco product, uncomplicated: Secondary | ICD-10-CM | POA: Insufficient documentation

## 2019-02-18 DIAGNOSIS — D696 Thrombocytopenia, unspecified: Secondary | ICD-10-CM | POA: Insufficient documentation

## 2019-02-18 DIAGNOSIS — J449 Chronic obstructive pulmonary disease, unspecified: Secondary | ICD-10-CM | POA: Insufficient documentation

## 2019-02-18 DIAGNOSIS — F40240 Claustrophobia: Secondary | ICD-10-CM

## 2019-02-18 LAB — CMP (CANCER CENTER ONLY)
ALT: 23 U/L (ref 0–44)
AST: 65 U/L — ABNORMAL HIGH (ref 15–41)
Albumin: 2.7 g/dL — ABNORMAL LOW (ref 3.5–5.0)
Alkaline Phosphatase: 158 U/L — ABNORMAL HIGH (ref 38–126)
Anion gap: 10 (ref 5–15)
BUN: <4 mg/dL — ABNORMAL LOW (ref 8–23)
CO2: 24 mmol/L (ref 22–32)
Calcium: 8.3 mg/dL — ABNORMAL LOW (ref 8.9–10.3)
Chloride: 101 mmol/L (ref 98–111)
Creatinine: 0.77 mg/dL (ref 0.61–1.24)
GFR, Est AFR Am: >60 mL/min (ref >60)
GFR, Estimated: >60 mL/min (ref >60)
Glucose, Bld: 127 mg/dL — ABNORMAL HIGH (ref 70–99)
Potassium: 3.6 mmol/L (ref 3.5–5.1)
Sodium: 135 mmol/L (ref 135–145)
Total Bilirubin: 2.2 mg/dL — ABNORMAL HIGH (ref 0.3–1.2)
Total Protein: 7.6 g/dL (ref 6.5–8.1)

## 2019-02-18 LAB — CBC WITH DIFFERENTIAL (CANCER CENTER ONLY)
Abs Immature Granulocytes: 0.01 10*3/uL (ref 0.00–0.07)
Basophils Absolute: 0 10*3/uL (ref 0.0–0.1)
Basophils Relative: 1 %
Eosinophils Absolute: 0.2 10*3/uL (ref 0.0–0.5)
Eosinophils Relative: 4 %
HCT: 36.6 % — ABNORMAL LOW (ref 39.0–52.0)
Hemoglobin: 13.5 g/dL (ref 13.0–17.0)
Immature Granulocytes: 0 %
Lymphocytes Relative: 31 %
Lymphs Abs: 1.5 10*3/uL (ref 0.7–4.0)
MCH: 37.7 pg — ABNORMAL HIGH (ref 26.0–34.0)
MCHC: 36.9 g/dL — ABNORMAL HIGH (ref 30.0–36.0)
MCV: 102.2 fL — ABNORMAL HIGH (ref 80.0–100.0)
Monocytes Absolute: 0.8 10*3/uL (ref 0.1–1.0)
Monocytes Relative: 17 %
Neutro Abs: 2.3 10*3/uL (ref 1.7–7.7)
Neutrophils Relative %: 47 %
Platelet Count: 57 10*3/uL — ABNORMAL LOW (ref 150–400)
RBC: 3.58 MIL/uL — ABNORMAL LOW (ref 4.22–5.81)
RDW: 14.7 % (ref 11.5–15.5)
WBC Count: 4.8 10*3/uL (ref 4.0–10.5)
nRBC: 0 % (ref 0.0–0.2)

## 2019-02-18 MED ORDER — LORAZEPAM 0.5 MG PO TABS: ORAL_TABLET | ORAL | 0 refills | Status: AC

## 2019-02-18 NOTE — Telephone Encounter (Signed)
I left a message regarding schedule  

## 2019-02-19 LAB — AFP TUMOR MARKER: AFP, Serum, Tumor Marker: 163 ng/mL — ABNORMAL HIGH (ref 0.0–8.3)

## 2019-02-21 ENCOUNTER — Other Ambulatory Visit: Payer: Self-pay | Admitting: Pharmacist

## 2019-02-21 NOTE — Patient Outreach (Signed)
[facility] [facility]) Care Management  [place]   02/21/2019  Zachary Steele 1950-10-09 [id number]  Patient referred to [place] for medication assistance.  Unfortunately, we have been unable to maintain contact with patient.  Applications for [employer] / [employer] mailed to patient 2x and several phone messages left for patient but have not received any response.    Plan: -Will close [facility] pharmacy case due to inability to engage with patient -Am happy to assist in the future if patient willing to engage   Ralene Bathe, PharmD, [place] [phone]

## 2019-02-23 ENCOUNTER — Telehealth: Payer: Self-pay | Admitting: *Deleted

## 2019-02-23 NOTE — Telephone Encounter (Signed)
Per Dr. Burr Medico, called and made pt aware that his tumor marker had increased significantly. Pt stated, "well I don't feel bad." Also made aware that he will be scheduled for a lab appt in a month, and move up scan if AFP continues to increase on next lab. Pt verbalized understanding.

## 2019-02-23 NOTE — Telephone Encounter (Signed)
-----   Message from Truitt Merle, MD sent at 02/23/2019  7:13 AM EST ----- Please let pt know his tumor marker has increased significantly, please schedule a repeated lab in a month, and I will move up his next scan if his AFP continue increasing on next lab. Thanks   Truitt Merle  02/23/2019

## 2019-02-23 NOTE — Progress Notes (Signed)
Awaiting call back.

## 2019-03-02 ENCOUNTER — Other Ambulatory Visit: Payer: Self-pay

## 2019-03-02 NOTE — Patient Outreach (Signed)
[place] [facility]) Care Management  03/02/2019  MELIK ODONALD 1950/10/14 [id number]   Initial Assessment   Outreach attempt # 3 to patient. No answer at present.      Plan: RN CM will make outreach attempt to patient within the month of December.   Enzo Montgomery, RN,BSN,CCM [place] Management Telephonic Care Management Coordinator Direct Phone: [phone] Toll Free: [phone] Fax: [phone]

## 2019-03-07 ENCOUNTER — Ambulatory Visit: Payer: Self-pay

## 2019-03-24 ENCOUNTER — Inpatient Hospital Stay: Payer: Medicare HMO | Attending: Hematology

## 2019-03-24 ENCOUNTER — Other Ambulatory Visit: Payer: Self-pay

## 2019-03-24 ENCOUNTER — Other Ambulatory Visit: Payer: Self-pay | Admitting: Nurse Practitioner

## 2019-03-24 DIAGNOSIS — C220 Liver cell carcinoma: Secondary | ICD-10-CM | POA: Diagnosis not present

## 2019-03-24 LAB — CBC WITH DIFFERENTIAL (CANCER CENTER ONLY)
Abs Immature Granulocytes: 0.01 10*3/uL (ref 0.00–0.07)
Basophils Absolute: 0 10*3/uL (ref 0.0–0.1)
Basophils Relative: 1 %
Eosinophils Absolute: 0.2 10*3/uL (ref 0.0–0.5)
Eosinophils Relative: 4 %
HCT: 37.7 % — ABNORMAL LOW (ref 39.0–52.0)
Hemoglobin: 13.5 g/dL (ref 13.0–17.0)
Immature Granulocytes: 0 %
Lymphocytes Relative: 36 %
Lymphs Abs: 1.5 10*3/uL (ref 0.7–4.0)
MCH: 37.9 pg — ABNORMAL HIGH (ref 26.0–34.0)
MCHC: 35.8 g/dL (ref 30.0–36.0)
MCV: 105.9 fL — ABNORMAL HIGH (ref 80.0–100.0)
Monocytes Absolute: 0.5 10*3/uL (ref 0.1–1.0)
Monocytes Relative: 11 %
Neutro Abs: 2.1 10*3/uL (ref 1.7–7.7)
Neutrophils Relative %: 48 %
Platelet Count: 65 10*3/uL — ABNORMAL LOW (ref 150–400)
RBC: 3.56 MIL/uL — ABNORMAL LOW (ref 4.22–5.81)
RDW: 14.9 % (ref 11.5–15.5)
WBC Count: 4.3 10*3/uL (ref 4.0–10.5)
nRBC: 0 % (ref 0.0–0.2)

## 2019-03-24 LAB — CMP (CANCER CENTER ONLY)
ALT: 20 U/L (ref 0–44)
AST: 52 U/L — ABNORMAL HIGH (ref 15–41)
Albumin: 2.7 g/dL — ABNORMAL LOW (ref 3.5–5.0)
Alkaline Phosphatase: 146 U/L — ABNORMAL HIGH (ref 38–126)
Anion gap: 8 (ref 5–15)
BUN: <4 mg/dL — ABNORMAL LOW (ref 8–23)
CO2: 24 mmol/L (ref 22–32)
Calcium: 8.2 mg/dL — ABNORMAL LOW (ref 8.9–10.3)
Chloride: 111 mmol/L (ref 98–111)
Creatinine: 0.8 mg/dL (ref 0.61–1.24)
GFR, Est AFR Am: >60 mL/min (ref >60)
GFR, Estimated: >60 mL/min (ref >60)
Glucose, Bld: 113 mg/dL — ABNORMAL HIGH (ref 70–99)
Potassium: 3.8 mmol/L (ref 3.5–5.1)
Sodium: 143 mmol/L (ref 135–145)
Total Bilirubin: 1.3 mg/dL — ABNORMAL HIGH (ref 0.3–1.2)
Total Protein: 7.5 g/dL (ref 6.5–8.1)

## 2019-03-25 ENCOUNTER — Other Ambulatory Visit: Payer: Self-pay | Admitting: Hematology

## 2019-03-25 ENCOUNTER — Telehealth: Payer: Self-pay

## 2019-03-25 ENCOUNTER — Other Ambulatory Visit: Payer: Medicare HMO

## 2019-03-25 NOTE — Telephone Encounter (Signed)
Left voice message for patient that his tumor marker AFP did not get drawn yesterday.  Per Dr. Burr Medico this is very important that we obtain this.  I asked him to call me back and leave on my voice message what day he can come back for this lab draw.

## 2019-03-25 NOTE — Telephone Encounter (Signed)
-----   Message from Alla Feeling, NP sent at 03/24/2019  4:23 PM EST ----- Please call the patient and let him know AFP was not drawn today, CBC and CMP are stable however. Please have him come back tomorrow for AFP if he is agreeable, it cannot be added to today's labs. My apologies. I placed the order and sent schedule message. Thanks, Regan Rakers

## 2019-03-28 ENCOUNTER — Telehealth: Payer: Self-pay

## 2019-03-28 NOTE — Telephone Encounter (Signed)
Patient called back regarding my message needing additional labwork (AFP) he agreed to come in on Wednesday 12/16 at 2:00 pm.  Appointment was scheduled.

## 2019-03-30 ENCOUNTER — Other Ambulatory Visit: Payer: Self-pay

## 2019-03-30 ENCOUNTER — Inpatient Hospital Stay: Payer: Medicare HMO

## 2019-03-30 DIAGNOSIS — C220 Liver cell carcinoma: Secondary | ICD-10-CM | POA: Diagnosis not present

## 2019-03-30 NOTE — Patient Outreach (Signed)
[place] [facility]) Care Management  03/30/2019  Zachary Steele 03/04/1951 [id number]   Telephone Assessment   Outreach attempt to patient. No answer at present after several rings.      Plan: RN CM will make outreach attempt to patient within the month of February.   Enzo Montgomery, RN,BSN,CCM [place] Management Telephonic Care Management Coordinator Direct Phone: [phone] Toll Free: [phone] Fax: [phone]

## 2019-03-31 ENCOUNTER — Ambulatory Visit: Payer: Self-pay

## 2019-03-31 LAB — AFP TUMOR MARKER: AFP, Serum, Tumor Marker: 184 ng/mL — ABNORMAL HIGH (ref 0.0–8.3)

## 2019-04-12 ENCOUNTER — Telehealth: Payer: Self-pay | Admitting: *Deleted

## 2019-04-12 NOTE — Telephone Encounter (Signed)
LM to call Dr Feng's nurse 

## 2019-04-12 NOTE — Telephone Encounter (Signed)
-----   Message from Alla Feeling, NP sent at 04/10/2019 11:27 AM EST ----- Please let him know AFP tumor marker continues to rise. I recommend to go ahead and schedule MRI abdomen in the next 1-2 weeks and phone visit after to review results.  Thanks, Regan Rakers

## 2019-04-12 NOTE — Telephone Encounter (Signed)
Mr Montero states the MRI is scheduled

## 2019-04-25 ENCOUNTER — Ambulatory Visit (HOSPITAL_COMMUNITY)
Admission: RE | Admit: 2019-04-25 | Discharge: 2019-04-25 | Disposition: A | Discharge status: Home / Self Care | Payer: Medicare HMO | Source: Ambulatory Visit | Attending: Hematology | Admitting: Hematology

## 2019-04-25 ENCOUNTER — Other Ambulatory Visit: Payer: Self-pay

## 2019-04-25 DIAGNOSIS — C220 Liver cell carcinoma: Secondary | ICD-10-CM | POA: Diagnosis not present

## 2019-04-25 MED ORDER — GADOBUTROL 1 MMOL/ML IV SOLN: 7.0000 mL | Freq: Once | INTRAVENOUS | Status: DC | PRN

## 2019-04-25 MED ORDER — GADOXETATE DISODIUM 0.25 MMOL/ML IV SOLN
10.0000 mL | Freq: Once | INTRAVENOUS | Status: AC | PRN
  Administered 2019-04-25: 7 mL via INTRAVENOUS

## 2019-04-26 ENCOUNTER — Telehealth: Payer: Self-pay

## 2019-04-26 NOTE — Telephone Encounter (Signed)
Left voice message for patient that we have changed his appointments to Wednesday 1/20 11:00 am for labs and 11:40 to see Dr. Burr Medico.  Asked him to call back if he has any questions.

## 2019-04-29 NOTE — Progress Notes (Signed)
[place]   Telephone:([phone]) [phone] Fax:([phone]   Clinic Follow up Note   Patient Care Team: [name], NP-C as PCP - General (Family Medicine) Milus Banister, MD as Attending Physician (Gastroenterology) Ilean China as Physician Assistant (Cardiology) Prescott Gum, Collier Salina, MD as Consulting Physician (Cardiothoracic Surgery) Greggory Keen, MD as Consulting Physician (Interventional Radiology) Tania Ade, Tomasa Blase, RN as [facility]  Date of Service:  05/04/2019  CHIEF COMPLAINT: f/u Zachary Steele  SUMMARY OF ONCOLOGIC HISTORY: Oncology History Overview Note  Cancer Staging Hepatocellular carcinoma [facility]) Staging form: Liver, AJCC 8th Edition - Clinical stage from 07/25/2017: Stage IIIA (cT3, cN0, cM0) - Signed by Truitt Merle, MD on 08/26/2017     Hepatocellular carcinoma ([place])  07/2017 Tumor Marker   AFP: 2431   07/15/2017 Imaging   IMPRESSION: ULTRASOUND ABDOMEN: Multiple hypoechoic liver masses, suspicious for multifocal hepatoma or hepatic metastatic disease. Abdomen MRI without and with contrast is recommended for further characterization. Cholelithiasis. No sonographic signs of acute cholecystitis or biliary dilatation. These results will be called to the ordering clinician or representative by the Radiologist Assistant, and communication documented in the PACS or zVision Dashboard.  ULTRASOUND HEPATIC ELASTOGRAPHY: Median hepatic shear wave velocity is calculated at 2.52 m/sec. Corresponding Metavir fibrosis score is Some F3 + F4. Risk of fibrosis is High. Follow-up: Follow up advised   07/25/2017 Imaging   IMPRESSION: At least 4 separate hepatic masses with characteristics of multifocal hepatoma, largest measuring 5.5 cm. No evidence of abdominal metastatic disease. Cholelithiasis.  No radiographic evidence of cholecystitis. Incidentally noted congenital small bowel malrotation. No evidence of volvulus  or bowel obstruction.    07/25/2017 Cancer Staging   Staging form: Liver, AJCC 8th Edition - Clinical stage from 07/25/2017: Stage IIIA (cT3, cN0, cM0) - Signed by Truitt Merle, MD on 08/26/2017   08/25/2017 Initial Diagnosis   Hepatocellular carcinoma ([place])   11/06/2017 Procedure   peripheral right hepatic artery Y 90 radio embolization   12/04/2017 Procedure   peripheral left hepatic Y 90 radio embolization   12/21/2017 Imaging   CT Chest W Contrast 12/21/17  IMPRESSION: 1. 4.8 x 5.9 x 6.7 cm heterogeneously enhancing hepatic mass compatible with the reported clinical history of hepatocellular carcinoma. No definite metastatic disease noted in the thorax, although there is a slightly prominent anterior mediastinal lymph node measuring 7 mm in short axis in the juxta pericardiac nodal station. This is nonspecific but warrants attention on follow-up studies. 2. Mild diffuse bronchial wall thickening with mild centrilobular and moderate paraseptal emphysema; imaging findings suggestive of underlying COPD. 3. In addition, there is evidence of probable interstitial lung disease in the lung bases. Outpatient referral to Pulmonology for further evaluation could be considered if clinically appropriate. 4. Aortic atherosclerosis, in addition to left main and 3 vessel coronary artery disease. Status post median sternotomy for CABG including LIMA to the LAD. Aortic Atherosclerosis (ICD10-I70.0) and Emphysema (ICD10-J43.9).   04/20/2018 Imaging   MRI Abdomen  IMPRESSION: 1. Mixed response to therapy. Dominant segment 4A left liver lobe tumor is decreased in size but demonstrates persistent thick peripheral enhancement indicating viable tumor. Inferior right liver lobe 3.0 cm mass is increased in size and demonstrates persistent avid arterial enhancement indicating viable tumor. Two additional smaller liver masses have decreased in size. One new subcentimeter segment 3 left liver lobe mass  suspicious for new site of hepatocellular carcinoma. 2. Hepatic cirrhosis.  Normal size spleen.  No ascites. 3. No abdominal lymphadenopathy.  4. Cholelithiasis.   05/06/2018 Procedure   05/06/18 right hepatic bland embolization by Dr. Annamaria Boots    11/04/2018 Imaging   MRI Abdomen 11/04/18  IMPRESSION: 1. Stable cirrhotic changes involving the liver along with fairly diffuse fatty infiltration. 2. Interval decrease in size of the segment 4B liver lesion when compared to prior studies. There is a persistent rim of enhancement around the lesion but I do not see any nodularity or mass effect to suggest this is tumor. It certainly could be enhancing granulation tissue or inflammatory change. Recommend continued surveillance. 3. Slight interval enlargement of two segment III early arterial phase enhancing nodules which could be dysplastic nodules or small HCC's. A third nodule in segment 4B is stable at 7 mm. 4. Cholelithiasis without sonographic findings for acute cholecystitis. Normal common bile duct. 5. No abdominal lymphadenopathy.      CURRENT THERAPY:  Observation  INTERVAL HISTORY:  Zachary Steele is here for a follow up of liver cancer. He presents to the clinic alone. He notes he does want to eat but has been eating less. He has been losing weight. He notes he has both sets of dentures, but feels he is not able to eat as well. He tries to increase liquids. He notes this has been going on for at least 5-6 months.  He notes he is finding it had to walk. He tries because he wants to. He notes he will get dizzy and SOB after a certain amount of walking. He notes his mood and attitude can lead to SOB as well.     REVIEW OF SYSTEMS:   Constitutional: Denies fevers, chills or abnormal weight loss (+) Low food intake, mild weight loss.  Eyes: Denies blurriness of vision Ears, nose, mouth, throat, and face: Denies mucositis or sore throat Respiratory: Denies cough, dyspnea or  wheezes Cardiovascular: Denies palpitation, chest discomfort or lower extremity swelling Gastrointestinal:  Denies nausea, heartburn or change in bowel habits Skin: Denies abnormal skin rashes Lymphatics: Denies new lymphadenopathy or easy bruising Neurological:Denies numbness, tingling or new weaknesses Behavioral/Psych: Mood is stable, no new changes  All other systems were reviewed with the patient and are negative.  MEDICAL HISTORY:  Past Medical History:  Diagnosis Date  . Alcohol dependence ([place])   . Anemia    denies  . Arthritis   . Cataract   . Cirrhosis ([place])   . Coronary artery disease due to lipid rich plaque   . Depression   . Hepatitis C    Has A and B also  . PAD (peripheral artery disease) ([place])    pt reports he has this and has chronic leg "soreness"  . Peripheral vascular disease ([place])    essentially normal circulations with two-vessel  runoff to the feet bilaterally  . Tobacco abuse     SURGICAL HISTORY: Past Surgical History:  Procedure Laterality Date  . CARDIAC CATHETERIZATION    . COLONOSCOPY    . CORONARY ARTERY BYPASS GRAFT  2010   [facility] bypass at [facility]  . DOPPLER ECHOCARDIOGRAPHY  2011  . IR ANGIOGRAM SELECTIVE EACH ADDITIONAL VESSEL  10/27/2017  . IR ANGIOGRAM SELECTIVE EACH ADDITIONAL VESSEL  10/27/2017  . IR ANGIOGRAM SELECTIVE EACH ADDITIONAL VESSEL  10/27/2017  . IR ANGIOGRAM SELECTIVE EACH ADDITIONAL VESSEL  10/27/2017  . IR ANGIOGRAM SELECTIVE EACH ADDITIONAL VESSEL  10/27/2017  . IR ANGIOGRAM SELECTIVE EACH ADDITIONAL VESSEL  10/27/2017  . IR ANGIOGRAM SELECTIVE EACH ADDITIONAL VESSEL  10/27/2017  . IR ANGIOGRAM SELECTIVE EACH ADDITIONAL  VESSEL  11/06/2017  . IR ANGIOGRAM SELECTIVE EACH ADDITIONAL VESSEL  11/06/2017  . IR ANGIOGRAM SELECTIVE EACH ADDITIONAL VESSEL  11/06/2017  . IR ANGIOGRAM SELECTIVE EACH ADDITIONAL VESSEL  12/04/2017  . IR ANGIOGRAM SELECTIVE EACH ADDITIONAL VESSEL  12/04/2017  . IR ANGIOGRAM SELECTIVE EACH ADDITIONAL VESSEL   05/06/2018  . IR ANGIOGRAM SELECTIVE EACH ADDITIONAL VESSEL  05/06/2018  . IR ANGIOGRAM SELECTIVE EACH ADDITIONAL VESSEL  05/06/2018  . IR ANGIOGRAM SELECTIVE EACH ADDITIONAL VESSEL  05/06/2018  . IR ANGIOGRAM VISCERAL SELECTIVE  10/27/2017  . IR ANGIOGRAM VISCERAL SELECTIVE  11/06/2017  . IR ANGIOGRAM VISCERAL SELECTIVE  12/04/2017  . IR ANGIOGRAM VISCERAL SELECTIVE  05/06/2018  . IR EMBO ARTERIAL NOT HEMORR HEMANG INC GUIDE ROADMAPPING  10/27/2017  . IR EMBO TUMOR ORGAN ISCHEMIA INFARCT INC GUIDE ROADMAPPING  11/06/2017  . IR EMBO TUMOR ORGAN ISCHEMIA INFARCT INC GUIDE ROADMAPPING  12/04/2017  . IR EMBO TUMOR ORGAN ISCHEMIA INFARCT INC GUIDE ROADMAPPING  05/06/2018  . IR RADIOLOGIST EVAL & MGMT  08/12/2017  . IR RADIOLOGIST EVAL & MGMT  09/22/2017  . IR RADIOLOGIST EVAL & MGMT  01/07/2018  . IR RADIOLOGIST EVAL & MGMT  04/20/2018  . IR RADIOLOGIST EVAL & MGMT  11/18/2018  . IR US GUIDE VASC ACCESS RIGHT  10/27/2017  . IR US GUIDE VASC ACCESS RIGHT  11/06/2017  . IR US GUIDE VASC ACCESS RIGHT  12/04/2017  . IR US GUIDE VASC ACCESS RIGHT  05/06/2018  . NM MYOVIEW LTD  2012    I have reviewed the social history and family history with the patient and they are unchanged from previous note.  ALLERGIES:  is allergic to statins.  MEDICATIONS:  Current Outpatient Medications  Medication Sig Dispense Refill  . albuterol (PROVENTIL HFA;VENTOLIN HFA) 108 (90 Base) MCG/ACT inhaler Inhale 2 puffs into the lungs every 6 (six) hours as needed for wheezing or shortness of breath. 1 Inhaler 6  . aspirin 81 MG tablet Take 81 mg by mouth daily.    . Cholecalciferol (VITAMIN D3 PO) Take 125 mcg by mouth daily.    Marland Kitchen LORazepam (ATIVAN) 0.5 MG tablet Take 1 tablet (0.5 mg) approximately 30 minutes prior to MRI. If still anxious, may take additional 1 tablet (0.5 mg). 2 tablet 0  . mometasone-formoterol (DULERA) 200-5 MCG/ACT AERO Inhale 2 puffs into the lungs 2 (two) times daily. 8.8 g 3  . Multiple Vitamin (MULTIVITAMIN  WITH MINERALS) TABS tablet Take 1 tablet by mouth daily.    . pantoprazole (PROTONIX) 40 MG tablet 40 mg one tab 30 minutes before breakfast meal 90 tablet 3  . tiotropium (SPIRIVA) 18 MCG inhalation capsule Place 1 capsule (18 mcg total) into inhaler and inhale daily. 30 capsule 6   No current facility-administered medications for this visit.    PHYSICAL EXAMINATION: ECOG PERFORMANCE STATUS: 2 - Symptomatic, <50% confined to bed  Vitals:   05/04/19 1104  BP: 122/82  Pulse: (!) 108  Resp: 20  Temp: 98.3 F (36.8 C)  SpO2: 100%   Filed Weights   05/04/19 1104  Weight: 141 lb (64 kg)    Due to COVID19 we will limit examination to appearance. Patient had no complaints.  GENERAL:alert, no distress and comfortable SKIN: skin color normal, no rashes or significant lesions EYES: normal, Conjunctiva are pink and non-injected, sclera clear  NEURO: alert & oriented x 3 with fluent speech   LABORATORY DATA:  I have reviewed the data as listed CBC Latest Ref Rng &  Units 05/04/2019 03/24/2019 02/18/2019  WBC 4.0 - 10.5 K/uL 4.7 4.3 4.8  Hemoglobin 13.0 - 17.0 g/dL 12.9(L) 13.5 13.5  Hematocrit 39.0 - 52.0 % 35.9(L) 37.7(L) 36.6(L)  Platelets 150 - 400 K/uL 58(L) 65(L) 57(L)     CMP Latest Ref Rng & Units 05/04/2019 03/24/2019 02/18/2019  Glucose 70 - 99 mg/dL 156(H) 113(H) 127(H)  BUN 8 - 23 mg/dL <4(L) <4(L) <4(L)  Creatinine 0.61 - 1.24 mg/dL 0.79 0.80 0.77  Sodium 135 - 145 mmol/L 137 143 135  Potassium 3.5 - 5.1 mmol/L 3.4(L) 3.8 3.6  Chloride 98 - 111 mmol/L 106 111 101  CO2 22 - 32 mmol/L 24 24 24   Calcium 8.9 - 10.3 mg/dL 7.9(L) 8.2(L) 8.3(L)  Total Protein 6.5 - 8.1 g/dL 7.4 7.5 7.6  Total Bilirubin 0.3 - 1.2 mg/dL 1.5(H) 1.3(H) 2.2(H)  Alkaline Phos 38 - 126 U/L 134(H) 146(H) 158(H)  AST 15 - 41 U/L 60(H) 52(H) 65(H)  ALT 0 - 44 U/L 23 20 23       RADIOGRAPHIC STUDIES: I have personally reviewed the radiological images as listed and agreed with the findings in the  report. No results found.   ASSESSMENT & PLAN:  Zachary Steele is a 69 y.o. male with    1. Hepatocellular Carcinoma, multifocal, cT3N0Mx -He was diagnosed in 08/2017 given he has at least 4 separate hepatic masses with the largest measuring 5.5 cm with typical imaging features of HCC. AFP is elevated to 2431;this is diagnostic forhepatocellular carcinoma. -Due to the size and multifocal disease he is not a candidate for liver transplant or resection.His liver cancer is not curableat this stage, but still treatable.  -He underwentbilateral hepatic arteryY90 treatments on 11/06/17 and 12/04/17 with Dr. Annamaria Boots. He tolerated the procedure very well but had mixed response.  -On 05/06/18 he underwent right hepatic bland embolization by Dr. Annamaria Boots. He was on Observation since.  -His 04/25/2019 liver MRI images were reviewed in our GI tumor conference this morning. It showed disease progression with new lesions in the left lobe, stable disease in the right lobe.  No extrahepatic notable distant metastasis.  IR Dr. Earleen Newport feels he is a candidate for more liver targeted therapy, I will review him back to Dr. Annamaria Boots.  --In the past 5-6 months he has been having fatigue, SOB and weight loss. I agian discussed his [facility] is not curable at this stage, and at some point he will start systemic treatment.  I discussed option of TKI's, and immunotherapy alone or in combination with Avastin. -f/u in 3 months.    2. Fatigue, Lower eating and weight loss  -Has been ongoing for the past 5-6 weeks  -He has not been able to return to work since [place] due to fatigue.  -He has adequate appetite but after 1 bite he will not want to eat more. He attributes this to his dentures. His weight is trending down. -Based on his MRI symptoms are likely related to disease progression.  -I recommend he increase ensure boost supplemental nutritional. Will see if he can get coupons.    3. Cirrhosis, Child-Pugh class A, Chronic  Hepatitis C -He is followed by PCP and [place] at liver care. Will defer hep C treatment until after [facility] treatment -She recommends Hep A and Hep B vaccination series -11/2017 Endoscopy shows smallEsophogeal varices, no gastric varices. Plan to repeat in 11/2019. -We recommend he continue to abstain from alcohol to prevent further liver damage and try to quit smoking.  4. COPD, Smoking Cessation, Cough -He has some intermittent SOB and cough, likely related to his smoking. PCP started him on inhaler -His PCP started him on inhaleras needed for SOB.He has developed a cough.  -He has reduced down toa few cigarettes a day, I again encouraged him to complete cessation.   PLAN: -MRI reviewed, he has disease progression in liver  -Send message to Dr Annamaria Boots for more liver targeted therapy  -free sample and coupons for chocolate Ensure  -Lab and f/u in 3 months   No problem-specific Assessment & Plan notes found for this encounter.   No orders of the defined types were placed in this encounter.  All questions were answered. The patient knows to call the clinic with any problems, questions or concerns. No barriers to learning was detected. The total time spent in the appointment was 30 minutes.     Truitt Merle, MD 05/04/2019   I, Joslyn Devon, am acting as scribe for Truitt Merle, MD.   I have reviewed the above documentation for accuracy and completeness, and I agree with the above.

## 2019-05-04 ENCOUNTER — Inpatient Hospital Stay: Payer: Medicare HMO | Attending: Hematology

## 2019-05-04 ENCOUNTER — Encounter: Payer: Self-pay | Admitting: Hematology

## 2019-05-04 ENCOUNTER — Other Ambulatory Visit: Payer: Self-pay | Admitting: Interventional Radiology

## 2019-05-04 ENCOUNTER — Inpatient Hospital Stay: Payer: Medicare HMO | Admitting: Hematology

## 2019-05-04 ENCOUNTER — Other Ambulatory Visit: Payer: Self-pay

## 2019-05-04 VITALS — BP 122/82 | HR 108 | Temp 98.3°F | Resp 20 | Ht 67.0 in | Wt 141.0 lb

## 2019-05-04 DIAGNOSIS — J449 Chronic obstructive pulmonary disease, unspecified: Secondary | ICD-10-CM | POA: Diagnosis not present

## 2019-05-04 DIAGNOSIS — K7460 Unspecified cirrhosis of liver: Secondary | ICD-10-CM | POA: Insufficient documentation

## 2019-05-04 DIAGNOSIS — R5383 Other fatigue: Secondary | ICD-10-CM | POA: Insufficient documentation

## 2019-05-04 DIAGNOSIS — R634 Abnormal weight loss: Secondary | ICD-10-CM | POA: Diagnosis not present

## 2019-05-04 DIAGNOSIS — C220 Liver cell carcinoma: Secondary | ICD-10-CM

## 2019-05-04 DIAGNOSIS — F17210 Nicotine dependence, cigarettes, uncomplicated: Secondary | ICD-10-CM | POA: Insufficient documentation

## 2019-05-04 DIAGNOSIS — B182 Chronic viral hepatitis C: Secondary | ICD-10-CM | POA: Diagnosis not present

## 2019-05-04 DIAGNOSIS — R0602 Shortness of breath: Secondary | ICD-10-CM | POA: Insufficient documentation

## 2019-05-04 LAB — CBC WITH DIFFERENTIAL (CANCER CENTER ONLY)
Abs Immature Granulocytes: 0.01 10*3/uL (ref 0.00–0.07)
Basophils Absolute: 0 10*3/uL (ref 0.0–0.1)
Basophils Relative: 1 %
Eosinophils Absolute: 0.1 10*3/uL (ref 0.0–0.5)
Eosinophils Relative: 2 %
HCT: 35.9 % — ABNORMAL LOW (ref 39.0–52.0)
Hemoglobin: 12.9 g/dL — ABNORMAL LOW (ref 13.0–17.0)
Immature Granulocytes: 0 %
Lymphocytes Relative: 41 %
Lymphs Abs: 1.9 10*3/uL (ref 0.7–4.0)
MCH: 37.1 pg — ABNORMAL HIGH (ref 26.0–34.0)
MCHC: 35.9 g/dL (ref 30.0–36.0)
MCV: 103.2 fL — ABNORMAL HIGH (ref 80.0–100.0)
Monocytes Absolute: 0.6 10*3/uL (ref 0.1–1.0)
Monocytes Relative: 13 %
Neutro Abs: 2 10*3/uL (ref 1.7–7.7)
Neutrophils Relative %: 43 %
Platelet Count: 58 10*3/uL — ABNORMAL LOW (ref 150–400)
RBC: 3.48 MIL/uL — ABNORMAL LOW (ref 4.22–5.81)
RDW: 15.5 % (ref 11.5–15.5)
WBC Count: 4.7 10*3/uL (ref 4.0–10.5)
nRBC: 0 % (ref 0.0–0.2)

## 2019-05-04 LAB — CMP (CANCER CENTER ONLY)
ALT: 23 U/L (ref 0–44)
AST: 60 U/L — ABNORMAL HIGH (ref 15–41)
Albumin: 2.4 g/dL — ABNORMAL LOW (ref 3.5–5.0)
Alkaline Phosphatase: 134 U/L — ABNORMAL HIGH (ref 38–126)
Anion gap: 7 (ref 5–15)
BUN: <4 mg/dL — ABNORMAL LOW (ref 8–23)
CO2: 24 mmol/L (ref 22–32)
Calcium: 7.9 mg/dL — ABNORMAL LOW (ref 8.9–10.3)
Chloride: 106 mmol/L (ref 98–111)
Creatinine: 0.79 mg/dL (ref 0.61–1.24)
GFR, Est AFR Am: >60 mL/min (ref >60)
GFR, Estimated: >60 mL/min (ref >60)
Glucose, Bld: 156 mg/dL — ABNORMAL HIGH (ref 70–99)
Potassium: 3.4 mmol/L — ABNORMAL LOW (ref 3.5–5.1)
Sodium: 137 mmol/L (ref 135–145)
Total Bilirubin: 1.5 mg/dL — ABNORMAL HIGH (ref 0.3–1.2)
Total Protein: 7.4 g/dL (ref 6.5–8.1)

## 2019-05-05 ENCOUNTER — Telehealth: Payer: Self-pay | Admitting: Hematology

## 2019-05-05 NOTE — Telephone Encounter (Signed)
Scheduled appt per 1/20 los.  Sent a message to HIM pool to get a calendar mailed out. 

## 2019-05-12 ENCOUNTER — Ambulatory Visit
Admission: RE | Admit: 2019-05-12 | Discharge: 2019-05-12 | Disposition: A | Discharge status: Home / Self Care | Payer: Medicare HMO | Source: Ambulatory Visit | Attending: Interventional Radiology | Admitting: Interventional Radiology

## 2019-05-12 ENCOUNTER — Other Ambulatory Visit: Payer: Self-pay

## 2019-05-12 ENCOUNTER — Encounter: Payer: Self-pay | Admitting: *Deleted

## 2019-05-12 DIAGNOSIS — C220 Liver cell carcinoma: Secondary | ICD-10-CM

## 2019-05-12 HISTORY — PX: IR RADIOLOGIST EVAL & MGMT: IMG5224

## 2019-05-12 NOTE — Progress Notes (Signed)
Patient ID: Zachary Steele, male   DOB: Oct 13, 1950, 69 y.o.   MRN: [id number]        Chief Complaint:  Multifocal hepatocellular carcinoma  Referring Physician(s): Burr Medico   History of Present Illness: Zachary Steele is a 69 y.o. male with chronic hepatitis C, alcoholism, cirrhosis and multifocal hepatocellular carcinoma.  He is now status post bilateral Y 90 embolizations and a peripheral right hepatic bland embolization.  Today's visit is a tele health visit because of Covid pandemic guidelines.  By telephone, we reviewed his most recent MRI.  Unfortunately this demonstrates slight progression with new lesions in the left hepatic lobe.  Stable treated disease in the right hepatic lobe.  No significant extrahepatic disease or metastatic process.  At this point, he would be a candidate for a repeat left hepatic Y 90 radioembolization.  Overall he is at his baseline.  He does describe chronic fatigue.  Stable functional status.  No current flank or abdominal pain.  No signs or symptoms of liver failure.  Stable LFTs, weight and appetite.  He denies alcohol use but continues to smoke.  Past Medical History:  Diagnosis Date  . Alcohol dependence ([place])   . Anemia    denies  . Arthritis   . Cataract   . Cirrhosis ([place])   . Coronary artery disease due to lipid rich plaque   . Depression   . Hepatitis C    Has A and B also  . PAD (peripheral artery disease) ([place])    pt reports he has this and has chronic leg "soreness"  . Peripheral vascular disease ([place])    essentially normal circulations with two-vessel  runoff to the feet bilaterally  . Tobacco abuse     Past Surgical History:  Procedure Laterality Date  . CARDIAC CATHETERIZATION    . COLONOSCOPY    . CORONARY ARTERY BYPASS GRAFT  2010   Triple bypass at [facility]  . DOPPLER ECHOCARDIOGRAPHY  2011  . IR ANGIOGRAM SELECTIVE EACH ADDITIONAL VESSEL  10/27/2017  . IR ANGIOGRAM SELECTIVE EACH ADDITIONAL VESSEL  10/27/2017  . IR ANGIOGRAM  SELECTIVE EACH ADDITIONAL VESSEL  10/27/2017  . IR ANGIOGRAM SELECTIVE EACH ADDITIONAL VESSEL  10/27/2017  . IR ANGIOGRAM SELECTIVE EACH ADDITIONAL VESSEL  10/27/2017  . IR ANGIOGRAM SELECTIVE EACH ADDITIONAL VESSEL  10/27/2017  . IR ANGIOGRAM SELECTIVE EACH ADDITIONAL VESSEL  10/27/2017  . IR ANGIOGRAM SELECTIVE EACH ADDITIONAL VESSEL  11/06/2017  . IR ANGIOGRAM SELECTIVE EACH ADDITIONAL VESSEL  11/06/2017  . IR ANGIOGRAM SELECTIVE EACH ADDITIONAL VESSEL  11/06/2017  . IR ANGIOGRAM SELECTIVE EACH ADDITIONAL VESSEL  12/04/2017  . IR ANGIOGRAM SELECTIVE EACH ADDITIONAL VESSEL  12/04/2017  . IR ANGIOGRAM SELECTIVE EACH ADDITIONAL VESSEL  05/06/2018  . IR ANGIOGRAM SELECTIVE EACH ADDITIONAL VESSEL  05/06/2018  . IR ANGIOGRAM SELECTIVE EACH ADDITIONAL VESSEL  05/06/2018  . IR ANGIOGRAM SELECTIVE EACH ADDITIONAL VESSEL  05/06/2018  . IR ANGIOGRAM VISCERAL SELECTIVE  10/27/2017  . IR ANGIOGRAM VISCERAL SELECTIVE  11/06/2017  . IR ANGIOGRAM VISCERAL SELECTIVE  12/04/2017  . IR ANGIOGRAM VISCERAL SELECTIVE  05/06/2018  . IR EMBO ARTERIAL NOT HEMORR HEMANG INC GUIDE ROADMAPPING  10/27/2017  . IR EMBO TUMOR ORGAN ISCHEMIA INFARCT INC GUIDE ROADMAPPING  11/06/2017  . IR EMBO TUMOR ORGAN ISCHEMIA INFARCT INC GUIDE ROADMAPPING  12/04/2017  . IR EMBO TUMOR ORGAN ISCHEMIA INFARCT INC GUIDE ROADMAPPING  05/06/2018  . IR RADIOLOGIST EVAL & MGMT  08/12/2017  . IR RADIOLOGIST EVAL & MGMT  09/22/2017  .  IR RADIOLOGIST EVAL & MGMT  01/07/2018  . IR RADIOLOGIST EVAL & MGMT  04/20/2018  . IR RADIOLOGIST EVAL & MGMT  11/18/2018  . IR US GUIDE VASC ACCESS RIGHT  10/27/2017  . IR US GUIDE VASC ACCESS RIGHT  11/06/2017  . IR US GUIDE VASC ACCESS RIGHT  12/04/2017  . IR US GUIDE VASC ACCESS RIGHT  05/06/2018  . NM MYOVIEW LTD  2012    Allergies: Statins  Medications: Prior to Admission medications   Medication Sig Start Date End Date Taking? Authorizing Provider  albuterol (PROVENTIL HFA;VENTOLIN HFA) 108 (90 Base) MCG/ACT inhaler Inhale  2 puffs into the lungs every 6 (six) hours as needed for wheezing or shortness of breath. 02/15/18   Chesley Mires, MD  aspirin 81 MG tablet Take 81 mg by mouth daily.    [provider]  Cholecalciferol (VITAMIN D3 PO) Take 125 mcg by mouth daily.    [provider]  LORazepam (ATIVAN) 0.5 MG tablet Take 1 tablet (0.5 mg) approximately 30 minutes prior to MRI. If still anxious, may take additional 1 tablet (0.5 mg). 02/18/19   Truitt Merle, MD  mometasone-formoterol [facility]) 200-5 MCG/ACT AERO Inhale 2 puffs into the lungs 2 (two) times daily. 01/05/19   Martyn Ehrich, NP  Multiple Vitamin (MULTIVITAMIN WITH MINERALS) TABS tablet Take 1 tablet by mouth daily.    [provider]  pantoprazole (PROTONIX) 40 MG tablet 40 mg one tab 30 minutes before breakfast meal 10/04/18   Martyn Ehrich, NP  tiotropium (SPIRIVA) 18 MCG inhalation capsule Place 1 capsule (18 mcg total) into inhaler and inhale daily. 01/05/19   Martyn Ehrich, NP     Family History  Problem Relation Age of Onset  . Stroke Mother   . Colon cancer Neg Hx     Social History   Socioeconomic History  . Marital status: Married    Spouse name: Not on file  . Number of children: 1  . Years of education: Not on file  . Highest education level: Not on file  Occupational History  . Occupation: Retail buyer    Comment: [employer] [place] [employer]   Tobacco Use  . Smoking status: Current Some Day Smoker    Packs/day: 0.10    Years: 52.00    Pack years: 5.20    Types: Cigarettes  . Smokeless tobacco: Never Used  . Tobacco comment: Is trying to stop smoking, still has one or two per day most days  Substance and Sexual Activity  . Alcohol use: No    Comment: quit 04/2017; previous 40 year history 1 wine bottle per day  . Drug use: No    Comment: occ  . Sexual activity: Yes  Other Topics Concern  . Not on file  Social History Narrative  . Not on file   Social Determinants of Health   Financial  Resource Strain:   . Difficulty of Paying Living Expenses: Not on file  Food Insecurity:   . Worried About Charity fundraiser in the Last Year: Not on file  . Ran Out of Food in the Last Year: Not on file  Transportation Needs:   . Lack of Transportation (Medical): Not on file  . Lack of Transportation (Non-Medical): Not on file  Physical Activity:   . Days of Exercise per Week: Not on file  . Minutes of Exercise per Session: Not on file  Stress:   . Feeling of Stress : Not on file  Social Connections:   .  Frequency of Communication with Friends and Family: Not on file  . Frequency of Social Gatherings with Friends and Family: Not on file  . Attends Religious Services: Not on file  . Active Member of Clubs or Organizations: Not on file  . Attends Archivist Meetings: Not on file  . Marital Status: Not on file    ECOG Status: 2 - Symptomatic, <50% confined to bed  Review of Systems  Review of Systems: A 12 point ROS discussed and pertinent positives are indicated in the HPI above.  All other systems are negative.  Physical Exam No direct physical exam was performed, telephone visit only today because of Covid pandemic.   Vital Signs: There were no vitals taken for this visit.  Imaging: MR Abdomen W Wo Contrast  Result Date: 04/25/2019 CLINICAL DATA:  Hepatocellular carcinoma. Multiple prior treatments. Y 90 treatment last January. EXAM: MRI ABDOMEN WITHOUT AND WITH CONTRAST TECHNIQUE: Multiplanar multisequence MR imaging of the abdomen was performed both before and after the administration of intravenous contrast. CONTRAST:  49mL EOVIST GADOXETATE DISODIUM 0.25 MOL/L IV SOLN COMPARISON:  11/04/2018 FINDINGS: Moderate motion degradation throughout. Lower chest: Normal heart size without pericardial or pleural effusion. Median sternotomy. Hepatobiliary: Cirrhosis. Severe hepatic steatosis. Multiple gallstones without acute cholecystitis or biliary duct dilatation. High  left hepatic lobe (segment 4A) presumed ablation defect measures 2.2 x 2.7 cm on 26/6 102. Compare 2.7 x 2.4 cm on the prior exam. This demonstrates surrounding hyperenhancement without solid components. Relatively similar. Foci of precontrast T1 hyperintensity. There are enlarging and new lesions within segment 3, highly suspicious. The more lateral measures 1.8 cm on 46/602, enlarged from 1.2 cm on the prior. The more medial measures 1.4 cm on 46/602, it is new. These demonstrate arterial phase hyperenhancement and delayed phase central hypoenhancement/washout with a surrounding "pseudo capsule" [id number]). These are both LR 5 lesions. More superiorly in segment 3, an enlarging focus of hyperenhancement on arterial phase image 37/602 measures 1.3 cm today versus 0.8 cm on the prior. This is vaguely hyperenhancing on later phase post-contrast imaging. LR 4. There are other arterially enhancing foci within the liver, including at 1.1 cm in the pericholecystic left lobe on 40/602, less than 1 cm in the right hepatic lobe on 47/602, and within the pericholecystic more inferior right hepatic lobe on 50/602. Given the extent of motion degradation, these other areas are indeterminate. Pancreas:  Normal, without mass or ductal dilatation. Spleen:  Normal in size, without focal abnormality. Adrenals/Urinary Tract: Normal adrenal glands. Normal kidneys, without hydronephrosis. Stomach/Bowel: Normal stomach and abdominal bowel loops. Vascular/Lymphatic: Aortic atherosclerosis. Patent portal and splenic veins. No abdominal adenopathy. Other:  No ascites. Musculoskeletal: No acute osseous abnormality. IMPRESSION: 1. Moderate motion degradation. 2. Left hepatic lobe lesions, including 3 segment 3 lesions which are considered LR 5 and LR 4 , as detailed above. 3. Other indeterminate foci of arterial phase hyperenhancement, warranting follow-up attention. 4. No evidence of abdominal metastasis. 5.  Aortic Atherosclerosis  (ICD10-I70.0). 6. Cirrhosis and hepatic steatosis. 7. Cholelithiasis. Electronically Signed   By: Abigail Miyamoto M.D.   On: 04/25/2019 18:13    Labs:  CBC: Recent Labs    08/26/18 1115 02/18/19 1225 03/24/19 1011 05/04/19 1038  WBC 4.5 4.8 4.3 4.7  HGB 15.2 13.5 13.5 12.9*  HCT 44.7 36.6* 37.7* 35.9*  PLT 106* 57* 65* 58*    COAGS: No results for input(s): INR, APTT in the last 8760 hours.  BMP: Recent Labs  08/26/18 1115 02/18/19 1225 03/24/19 1011 05/04/19 1038  NA 139 135 143 137  K 3.9 3.6 3.8 3.4*  CL 106 101 111 106  CO2 24 24 24 24   GLUCOSE 79 127* 113* 156*  BUN 6* <4* <4* <4*  CALCIUM 9.6 8.3* 8.2* 7.9*  CREATININE 0.90 0.77 0.80 0.79  GFRNONAA >60 >60 >60 >60  GFRAA >60 >60 >60 >60    LIVER FUNCTION TESTS: Recent Labs    08/26/18 1115 02/18/19 1225 03/24/19 1011 05/04/19 1038  BILITOT 0.7 2.2* 1.3* 1.5*  AST 45* 65* 52* 60*  ALT 28 23 20 23   ALKPHOS 125 158* 146* 134*  PROT 8.6* 7.6 7.5 7.4  ALBUMIN 3.8 2.7* 2.7* 2.4*    TUMOR MARKERS: No results for input(s): AFPTM, CEA, CA199, CHROMGRNA in the last 8760 hours.  Assessment and Plan:  Multifocal hepatocellular carcinoma status post bilateral Y 90 embolizations and an additional right hepatic peripheral bland embolization for multifocal disease.  Recent MRI in January 2021 demonstrates stable treated right hepatic disease but progression in the left hepatic lobe with 3 new suspicious lesions identified all measuring 2 cm or less in size.  No extrahepatic disease.  Stable functional status, renal function and LFTs.  Bilirubin at 1.5.  Plan: After review of his imaging and discussion he is a candidate for an additional left hepatic Y 90 radioembolization.  He would like to proceed with additional radioembolization.  The procedure, risk, benefits and alternatives were reviewed.  The expected goals, recovery and outcome as well as continued surveillance were reviewed.  He understands this is  palliative management for multifocal HCC.  He does wish to proceed with the treatment.  Scheduled for left hepatic Y 90 radial embolization at [facility] in the next few weeks.  Thank you for this interesting consult.  I greatly enjoyed meeting Zachary Steele and look forward to participating in their care.  A copy of this report was sent to the requesting provider on this date.  Electronically Signed: Greggory Keen 05/12/2019, 1:19 PM   I spent a total of    25 Minutes in remote  clinical consultation, greater than 50% of which was counseling/coordinating care for this patient with multifocal [place].    Visit type: Audio only (telephone). Audio (no video) only due to patient's lack of internet/smartphone capability. Alternative for in-person consultation at [facility], [address], [place], [place]. This visit type was conducted due to national recommendations for restrictions regarding the COVID-19 Pandemic (e.g. social distancing).  This format is felt to be most appropriate for this patient at this time.  All issues noted in this document were discussed and addressed.

## 2019-05-17 ENCOUNTER — Other Ambulatory Visit (HOSPITAL_COMMUNITY): Payer: Self-pay | Admitting: Interventional Radiology

## 2019-05-17 DIAGNOSIS — C220 Liver cell carcinoma: Secondary | ICD-10-CM

## 2019-05-18 ENCOUNTER — Other Ambulatory Visit: Payer: Medicare HMO

## 2019-05-23 ENCOUNTER — Ambulatory Visit: Payer: Medicare HMO | Admitting: Hematology

## 2019-05-27 ENCOUNTER — Other Ambulatory Visit: Payer: Self-pay

## 2019-05-27 NOTE — Patient Outreach (Signed)
[facility]) Care Management  05/27/2019  KOHEI BOORTZ September 19, 1950 [id number]   Telephone Assessment    Outreach attempt #5 to patient. No answer at present.      Plan: RN CM will make outreach attempt to patient within the month of April.     Enzo Montgomery, RN,BSN,CCM [facility] Telephonic Care Management Coordinator Direct Phone: [phone] Toll Free: [phone] Fax: [phone]

## 2019-05-31 ENCOUNTER — Ambulatory Visit: Payer: Self-pay

## 2019-06-06 ENCOUNTER — Other Ambulatory Visit: Payer: Self-pay | Admitting: Student

## 2019-06-07 ENCOUNTER — Other Ambulatory Visit: Payer: Self-pay

## 2019-06-07 ENCOUNTER — Encounter (HOSPITAL_COMMUNITY): Payer: Self-pay

## 2019-06-07 ENCOUNTER — Other Ambulatory Visit (HOSPITAL_COMMUNITY): Payer: Self-pay | Admitting: Interventional Radiology

## 2019-06-07 ENCOUNTER — Encounter (HOSPITAL_COMMUNITY)
Admission: RE | Admit: 2019-06-07 | Discharge: 2019-06-07 | Disposition: A | Discharge status: Home / Self Care | Payer: Medicare HMO | Source: Ambulatory Visit | Attending: Interventional Radiology | Admitting: Interventional Radiology

## 2019-06-07 ENCOUNTER — Ambulatory Visit (HOSPITAL_COMMUNITY)
Admission: RE | Admit: 2019-06-07 | Discharge: 2019-06-07 | Disposition: A | Discharge status: Home / Self Care | Payer: Medicare HMO | Source: Ambulatory Visit | Attending: Interventional Radiology | Admitting: Interventional Radiology

## 2019-06-07 DIAGNOSIS — C220 Liver cell carcinoma: Secondary | ICD-10-CM

## 2019-06-07 DIAGNOSIS — B1920 Unspecified viral hepatitis C without hepatic coma: Secondary | ICD-10-CM | POA: Insufficient documentation

## 2019-06-07 DIAGNOSIS — K7460 Unspecified cirrhosis of liver: Secondary | ICD-10-CM | POA: Diagnosis not present

## 2019-06-07 DIAGNOSIS — I739 Peripheral vascular disease, unspecified: Secondary | ICD-10-CM | POA: Diagnosis not present

## 2019-06-07 DIAGNOSIS — I2583 Coronary atherosclerosis due to lipid rich plaque: Secondary | ICD-10-CM | POA: Diagnosis not present

## 2019-06-07 DIAGNOSIS — Z79899 Other long term (current) drug therapy: Secondary | ICD-10-CM | POA: Diagnosis not present

## 2019-06-07 DIAGNOSIS — Z7982 Long term (current) use of aspirin: Secondary | ICD-10-CM | POA: Insufficient documentation

## 2019-06-07 DIAGNOSIS — I2510 Atherosclerotic heart disease of native coronary artery without angina pectoris: Secondary | ICD-10-CM | POA: Diagnosis not present

## 2019-06-07 DIAGNOSIS — F329 Major depressive disorder, single episode, unspecified: Secondary | ICD-10-CM | POA: Diagnosis not present

## 2019-06-07 DIAGNOSIS — C787 Secondary malignant neoplasm of liver and intrahepatic bile duct: Secondary | ICD-10-CM | POA: Diagnosis not present

## 2019-06-07 HISTORY — DX: Dyspnea, unspecified: R06.00

## 2019-06-07 HISTORY — PX: IR ANGIOGRAM SELECTIVE EACH ADDITIONAL VESSEL: IMG667

## 2019-06-07 HISTORY — PX: IR ANGIOGRAM VISCERAL SELECTIVE: IMG657

## 2019-06-07 HISTORY — PX: IR US GUIDE VASC ACCESS RIGHT: IMG2390

## 2019-06-07 LAB — CBC WITH DIFFERENTIAL/PLATELET
Abs Immature Granulocytes: 0.01 10*3/uL (ref 0.00–0.07)
Basophils Absolute: 0 10*3/uL (ref 0.0–0.1)
Basophils Relative: 1 %
Eosinophils Absolute: 0 10*3/uL (ref 0.0–0.5)
Eosinophils Relative: 1 %
HCT: 38.6 % — ABNORMAL LOW (ref 39.0–52.0)
Hemoglobin: 13.7 g/dL (ref 13.0–17.0)
Immature Granulocytes: 0 %
Lymphocytes Relative: 27 %
Lymphs Abs: 1 10*3/uL (ref 0.7–4.0)
MCH: 37.1 pg — ABNORMAL HIGH (ref 26.0–34.0)
MCHC: 35.5 g/dL (ref 30.0–36.0)
MCV: 104.6 fL — ABNORMAL HIGH (ref 80.0–100.0)
Monocytes Absolute: 0.4 10*3/uL (ref 0.1–1.0)
Monocytes Relative: 11 %
Neutro Abs: 2.2 10*3/uL (ref 1.7–7.7)
Neutrophils Relative %: 60 %
Platelets: 65 10*3/uL — ABNORMAL LOW (ref 150–400)
RBC: 3.69 MIL/uL — ABNORMAL LOW (ref 4.22–5.81)
RDW: 16.2 % — ABNORMAL HIGH (ref 11.5–15.5)
WBC: 3.7 10*3/uL — ABNORMAL LOW (ref 4.0–10.5)
nRBC: 0 % (ref 0.0–0.2)

## 2019-06-07 LAB — COMPREHENSIVE METABOLIC PANEL
ALT: 22 U/L (ref 0–44)
AST: 60 U/L — ABNORMAL HIGH (ref 15–41)
Albumin: 2.9 g/dL — ABNORMAL LOW (ref 3.5–5.0)
Alkaline Phosphatase: 143 U/L — ABNORMAL HIGH (ref 38–126)
Anion gap: 13 (ref 5–15)
BUN: <5 mg/dL — ABNORMAL LOW (ref 8–23)
CO2: 21 mmol/L — ABNORMAL LOW (ref 22–32)
Calcium: 8.6 mg/dL — ABNORMAL LOW (ref 8.9–10.3)
Chloride: 106 mmol/L (ref 98–111)
Creatinine, Ser: 0.73 mg/dL (ref 0.61–1.24)
GFR calc Af Amer: >60 mL/min (ref >60)
GFR calc non Af Amer: >60 mL/min (ref >60)
Glucose, Bld: 99 mg/dL (ref 70–99)
Potassium: 3.5 mmol/L (ref 3.5–5.1)
Sodium: 140 mmol/L (ref 135–145)
Total Bilirubin: 2.4 mg/dL — ABNORMAL HIGH (ref 0.3–1.2)
Total Protein: 8.2 g/dL — ABNORMAL HIGH (ref 6.5–8.1)

## 2019-06-07 LAB — PROTIME-INR
INR: 1.4 — ABNORMAL HIGH (ref 0.8–1.2)
Prothrombin Time: 17 seconds — ABNORMAL HIGH (ref 11.4–15.2)

## 2019-06-07 MED ORDER — PIPERACILLIN-TAZOBACTAM 3.375 G IVPB: 3.3750 g | INTRAVENOUS | Status: AC

## 2019-06-07 MED ORDER — SODIUM CHLORIDE 0.9 % IV SOLN
8.0000 mg | Freq: Once | INTRAVENOUS | Status: AC
  Administered 2019-06-07: 8 mg via INTRAVENOUS
  Filled 2019-06-07: qty 4

## 2019-06-07 MED ORDER — PANTOPRAZOLE SODIUM 40 MG IV SOLR: 40.0000 mg | Freq: Once | INTRAVENOUS | Status: AC

## 2019-06-07 MED ORDER — IOHEXOL 300 MG/ML  SOLN
100.0000 mL | Freq: Once | INTRAMUSCULAR | Status: AC | PRN
  Administered 2019-06-07: 45 mL via INTRA_ARTERIAL

## 2019-06-07 MED ORDER — TECHNETIUM TO 99M ALBUMIN AGGREGATED: 5.5000 | Freq: Once | INTRAVENOUS | Status: DC | PRN

## 2019-06-07 MED ORDER — PANTOPRAZOLE SODIUM 40 MG IV SOLR
INTRAVENOUS | Status: AC
  Administered 2019-06-07: 40 mg via INTRAVENOUS
  Filled 2019-06-07: qty 40

## 2019-06-07 MED ORDER — PIPERACILLIN-TAZOBACTAM 3.375 G IVPB
INTRAVENOUS | Status: AC
  Administered 2019-06-07: 3.375 g via INTRAVENOUS
  Filled 2019-06-07: qty 50

## 2019-06-07 MED ORDER — FENTANYL CITRATE (PF) 100 MCG/2ML IJ SOLN
INTRAMUSCULAR | Status: AC | PRN
  Administered 2019-06-07 (×2): 50 ug via INTRAVENOUS

## 2019-06-07 MED ORDER — SODIUM CHLORIDE 0.9 % IV SOLN: INTRAVENOUS | Status: DC

## 2019-06-07 MED ORDER — LIDOCAINE HCL (PF) 1 % IJ SOLN
INTRAMUSCULAR | Status: AC | PRN
  Administered 2019-06-07: 5 mL

## 2019-06-07 MED ORDER — DEXAMETHASONE SODIUM PHOSPHATE 10 MG/ML IJ SOLN
8.0000 mg | Freq: Once | INTRAMUSCULAR | Status: AC
  Administered 2019-06-07: 8 mg via INTRAVENOUS
  Filled 2019-06-07: qty 1

## 2019-06-07 MED ORDER — IOHEXOL 300 MG/ML  SOLN
100.0000 mL | Freq: Once | INTRAMUSCULAR | Status: AC | PRN
  Administered 2019-06-07: 73 mL via INTRA_ARTERIAL

## 2019-06-07 MED ORDER — MIDAZOLAM HCL 2 MG/2ML IJ SOLN
INTRAMUSCULAR | Status: AC | PRN
  Administered 2019-06-07 (×3): 1 mg via INTRAVENOUS

## 2019-06-07 MED ORDER — LIDOCAINE HCL 1 % IJ SOLN
INTRAMUSCULAR | Status: AC
  Filled 2019-06-07: qty 20

## 2019-06-07 MED ORDER — MIDAZOLAM HCL 2 MG/2ML IJ SOLN
INTRAMUSCULAR | Status: AC
  Filled 2019-06-07: qty 4

## 2019-06-07 MED ORDER — PIPERACILLIN-TAZOBACTAM 3.375 G IVPB 30 MIN: 3.3750 g | Freq: Once | INTRAVENOUS | Status: DC

## 2019-06-07 MED ORDER — HYDROCODONE-ACETAMINOPHEN 5-325 MG PO TABS: 1.0000 | ORAL_TABLET | ORAL | Status: DC | PRN

## 2019-06-07 MED ORDER — FENTANYL CITRATE (PF) 100 MCG/2ML IJ SOLN
INTRAMUSCULAR | Status: AC
  Filled 2019-06-07: qty 2

## 2019-06-07 NOTE — Discharge Instructions (Signed)
Urgent needs -  IR MD on call [phone]  Wound - may remove dressing and shower in 24 hours.  Replace with clean bandaid, keep clean and dry. Do not submerge in water until healing well with scab.   Ice pack to grion as needed for discomfort.    Moderate Conscious Sedation, Adult, Care After These instructions provide you with information about caring for yourself after your procedure. Your health care provider may also give you more specific instructions. Your treatment has been planned according to current medical practices, but problems sometimes occur. Call your health care provider if you have any problems or questions after your procedure. What can I expect after the procedure? After your procedure, it is common:  To feel sleepy for several hours.  To feel clumsy and have poor balance for several hours.  To have poor judgment for several hours.  To vomit if you eat too soon. Follow these instructions at home: For at least 24 hours after the procedure:  Do not: ? Participate in activities where you could fall or become injured. ? Drive. ? Use heavy machinery. ? Drink alcohol. ? Take sleeping pills or medicines that cause drowsiness. ? Make important decisions or sign legal documents. ? Take care of children on your own.  Rest. Eating and drinking  Follow the diet recommended by your health care provider.  If you vomit: ? Drink water, juice, or soup when you can drink without vomiting. ? Make sure you have little or no nausea before eating solid foods. General instructions  Have a responsible adult stay with you until you are awake and alert.  Take over-the-counter and prescription medicines only as told by your health care provider.  If you smoke, do not smoke without supervision.  Keep all follow-up visits as told by your health care provider. This is important. Contact a health care provider if:  You keep feeling nauseous or you keep vomiting.  You feel  light-headed.  You develop a rash.  You have a fever. Get help right away if:  You have trouble breathing. This information is not intended to replace advice given to you by your health care provider. Make sure you discuss any questions you have with your health care provider. Document Revised: 03/13/2017 Document Reviewed: 07/21/2015 Elsevier Patient Education  [place].  Hepatic Artery Radioembolization, Care After This sheet gives you information about how to care for yourself after your procedure. Your health care provider may also give you more specific instructions. If you have problems or questions, contact your health care provider. What can I expect after the procedure? After the procedure, it is common to have:  A slight fever for 1-2 weeks. If your fever gets worse, tell your health care provider.  Fatigue.  Loss of appetite. This should gradually improve after about 1 week.  Abdominal pain on your right side.  Soreness and tenderness in your groin area where the needle and catheter were placed (puncture site). Follow these instructions at home: Puncture site care  Follow instructions from your health care provider about how to take care of the puncture site. Make sure you: ? Wash your hands with soap and water before you change your bandage (dressing). If soap and water are not available, use hand sanitizer. ? Change your dressing as told by your health care provider. ? Leave stitches (sutures), skin glue, or adhesive strips in place. These skin closures may need to stay in place for 2 weeks or longer.  If adhesive strip edges start to loosen and curl up, you may trim the loose edges. Do not remove adhesive strips completely unless your health care provider tells you to do that.  Check your puncture site every day for signs of infection. Check for: ? More redness, swelling, or pain. ? More fluid or blood. ? Warmth. ? Pus or a bad smell. Activity  Rest and  return to your normal activities as told by your health care provider. Ask your health care provider what activities are safe for you.  Do not drive for 24 hours after the procedure if you were given a medicine to help you relax (sedative).  Do not lift anything that is heavier than 10 lb (4.5 kg) until your health care provider says that it is safe. Medicines  Take over-the-counter and prescription medicines only as told by your health care provider.  Do not drive or use heavy machinery while taking prescription pain medicine. Radiation precautions  For up to a week after your procedure, there will be a small amount of radioactivity near your liver. This is not especially dangerous to other people. However, you should follow these precautions for 7 days: ? Do not come in close contact with people. ? Do not sleep in the same bed as someone else. ? Do not hold children or babies. ? Do not have contact with pregnant women. General instructions  To prevent or treat constipation while you are taking prescription pain medicine, your health care provider may recommend that you: ? Drink enough fluid to keep your urine clear or pale yellow. ? Take over-the-counter or prescription medicines. ? Eat foods that are high in fiber, such as fresh fruits and vegetables, whole grains, and beans. ? Limit foods that are high in fat and processed sugars, such as fried and sweet foods.  Eat frequent small meals until your appetite returns. Follow instructions from your health care provider about eating or drinking restrictions.  Do not take baths, swim, or use a hot tub until your health care provider approves. You may take showers. Wash your puncture site with mild soap and water and pat the area dry.  Wear compression stockings as told by your health care provider. These stockings help to prevent blood clots and reduce swelling in your legs.  Keep all follow-up visits as told by your health care  provider. This is important. You may need to have blood tests and imaging tests done. Contact a health care provider if:  You have more redness, swelling, or pain around your puncture site.  You have more fluid or blood coming from your puncture site.  Your puncture site feels warm to the touch.  You have pus or a bad smell coming from your puncture site.  You have pain that: ? Gets worse. ? Does not get better with medicine. ? Feels like very bad heartburn. ? Is in the middle of your abdomen, above your belly button.  Your skin and the white parts of your eyes turn yellow (jaundice).  The color of your urine changes to dark brown.  The color of your stool changes to light yellow.  Your abdominal measurement (girth) increases in a short period of time.  You gain more than 5 lb (2.3 kg) in a short period of time. Get help right away if:  You have a fever that lasts longer than 2 weeks or is higher than what your health care provider told you to expect.  You develop any of  the following in your legs: ? Pain. ? Swelling. ? Skin that is cold or pale or turns blue.  You have chest pain.  You have blood in your vomit, saliva, or stool.  You have trouble breathing. This information is not intended to replace advice given to you by your health care provider. Make sure you discuss any questions you have with your health care provider. Document Revised: 03/13/2017 Document Reviewed: 12/29/2015 Elsevier Patient Education  2020 [employer].  The following instructions are for your next IR appointment  for Y90 procedure:  Post Y-90 Radioembolization Discharge Instructions  You have been given a radioactive material during your procedure.  While it is safe for you to be discharged home from the hospital, you need to proceed directly home.    Do not use public transportation, including air travel, lasting more than 2 hours for 1 week.  Avoid crowded public places for 1  week.  Adult visitors should try to avoid close contact with you for 1 week.    Children and pregnant females should not visit or have close contact with you for 1 week.  Items that you touch are not radioactive.  Do not sleep in the same bed as your partner for 1 week, and a condom should be used for sexual activity during the first 24 hours.  Your blood may be radioactive and caution should be used if any bleeding occurs during the recovery period.  Body fluids may be radioactive for 24 hours.  Wash your hands after voiding.  Men should sit to urinate.  Dispose of any soiled materials (flush down toilet or place in trash at home) during the first day.  Drink 6 to 8 glasses of fluids per day for 5 days to hydrate yourself.  If you need to see a doctor during the first week, you must let them know that you were treated with yttrium-90 microspheres, and will be slightly radioactive.  They can call Interventional Radiology [phone] with any questions.

## 2019-06-07 NOTE — Sedation Documentation (Signed)
Case end.

## 2019-06-07 NOTE — Procedures (Signed)
Multi focal HCC  S/p hepatic angio and MAA shunt injection  No comp Stable ebl min Full report in pacs

## 2019-06-07 NOTE — Sedation Documentation (Signed)
To nuc med for study 

## 2019-06-07 NOTE — H&P (Addendum)
Referring Physician(s): Drazek,D  Supervising Physician: Daryll Brod  Patient Status:  [facility]  Chief Complaint:  Multifocal hepatocellular carcinoma  Subjective: Patient familiar to IR service from prior right hepatic Y- 90 radio embolization on 11/06/2017 and left hepatic Y- 90 radioembolization on 12/04/2017.  He has also undergone bland embolization of a 3 cm right hepatocellular carcinoma on 05/06/2018.  He has a history of multifocal HCC, hepatitis C and cirrhosis.  Recent MRI in January of this year demonstrated stable treated right hepatic disease but progression in the left hepatic lobe with 3 new suspicious lesions identified all measuring 2 cm or less in size.  No extrahepatic disease.  Following recent discussions with Dr. Annamaria Boots he presents today for repeat hepatic/visceral arteriogram roadmapping study prior to planned repeat left hepatic lobe Y-90 radioembolization.  He currently denies fever, headache, chest pain, back pain, nausea, vomiting or bleeding.  He continues to smoke and does have some dyspnea with exertion, occasional cough and some intermittent abdominal pain.  Past Medical History:  Diagnosis Date  . Alcohol dependence ([place])   . Anemia    denies  . Arthritis   . Cataract   . Cirrhosis ([place])   . Coronary artery disease due to lipid rich plaque   . Depression   . Dyspnea   . Hepatitis C    Has A and B also  . PAD (peripheral artery disease) ([place])    pt reports he has this and has chronic leg "soreness"  . Peripheral vascular disease ([place])    essentially normal circulations with two-vessel  runoff to the feet bilaterally  . Tobacco abuse    Past Surgical History:  Procedure Laterality Date  . CARDIAC CATHETERIZATION    . COLONOSCOPY    . CORONARY ARTERY BYPASS GRAFT  2010   Triple bypass at [facility]  . DOPPLER ECHOCARDIOGRAPHY  2011  . IR ANGIOGRAM SELECTIVE EACH ADDITIONAL VESSEL  10/27/2017  . IR ANGIOGRAM SELECTIVE EACH ADDITIONAL VESSEL  10/27/2017    . IR ANGIOGRAM SELECTIVE EACH ADDITIONAL VESSEL  10/27/2017  . IR ANGIOGRAM SELECTIVE EACH ADDITIONAL VESSEL  10/27/2017  . IR ANGIOGRAM SELECTIVE EACH ADDITIONAL VESSEL  10/27/2017  . IR ANGIOGRAM SELECTIVE EACH ADDITIONAL VESSEL  10/27/2017  . IR ANGIOGRAM SELECTIVE EACH ADDITIONAL VESSEL  10/27/2017  . IR ANGIOGRAM SELECTIVE EACH ADDITIONAL VESSEL  11/06/2017  . IR ANGIOGRAM SELECTIVE EACH ADDITIONAL VESSEL  11/06/2017  . IR ANGIOGRAM SELECTIVE EACH ADDITIONAL VESSEL  11/06/2017  . IR ANGIOGRAM SELECTIVE EACH ADDITIONAL VESSEL  12/04/2017  . IR ANGIOGRAM SELECTIVE EACH ADDITIONAL VESSEL  12/04/2017  . IR ANGIOGRAM SELECTIVE EACH ADDITIONAL VESSEL  05/06/2018  . IR ANGIOGRAM SELECTIVE EACH ADDITIONAL VESSEL  05/06/2018  . IR ANGIOGRAM SELECTIVE EACH ADDITIONAL VESSEL  05/06/2018  . IR ANGIOGRAM SELECTIVE EACH ADDITIONAL VESSEL  05/06/2018  . IR ANGIOGRAM VISCERAL SELECTIVE  10/27/2017  . IR ANGIOGRAM VISCERAL SELECTIVE  11/06/2017  . IR ANGIOGRAM VISCERAL SELECTIVE  12/04/2017  . IR ANGIOGRAM VISCERAL SELECTIVE  05/06/2018  . IR EMBO ARTERIAL NOT HEMORR HEMANG INC GUIDE ROADMAPPING  10/27/2017  . IR EMBO TUMOR ORGAN ISCHEMIA INFARCT INC GUIDE ROADMAPPING  11/06/2017  . IR EMBO TUMOR ORGAN ISCHEMIA INFARCT INC GUIDE ROADMAPPING  12/04/2017  . IR EMBO TUMOR ORGAN ISCHEMIA INFARCT INC GUIDE ROADMAPPING  05/06/2018  . IR RADIOLOGIST EVAL & MGMT  08/12/2017  . IR RADIOLOGIST EVAL & MGMT  09/22/2017  . IR RADIOLOGIST EVAL & MGMT  01/07/2018  . IR RADIOLOGIST EVAL &  MGMT  04/20/2018  . IR RADIOLOGIST EVAL & MGMT  11/18/2018  . IR RADIOLOGIST EVAL & MGMT  05/12/2019  . IR US GUIDE VASC ACCESS RIGHT  10/27/2017  . IR US GUIDE VASC ACCESS RIGHT  11/06/2017  . IR US GUIDE VASC ACCESS RIGHT  12/04/2017  . IR US GUIDE VASC ACCESS RIGHT  05/06/2018  . NM MYOVIEW LTD  2012      Allergies: Statins  Medications: Prior to Admission medications   Medication Sig Start Date End Date Taking? Authorizing Provider  aspirin 81  MG tablet Take 81 mg by mouth daily.   Yes [provider]  Turmeric (QC TUMERIC COMPLEX) 500 MG CAPS Take by mouth daily.   Yes [provider]  zinc gluconate 50 MG tablet Take 50 mg by mouth daily.   Yes [provider]  albuterol (PROVENTIL HFA;VENTOLIN HFA) 108 (90 Base) MCG/ACT inhaler Inhale 2 puffs into the lungs every 6 (six) hours as needed for wheezing or shortness of breath. 02/15/18   Chesley Mires, MD  Cholecalciferol (VITAMIN D3 PO) Take 125 mcg by mouth daily.    [provider]  LORazepam (ATIVAN) 0.5 MG tablet Take 1 tablet (0.5 mg) approximately 30 minutes prior to MRI. If still anxious, may take additional 1 tablet (0.5 mg). 02/18/19   Truitt Merle, MD  mometasone-formoterol [facility]) 200-5 MCG/ACT AERO Inhale 2 puffs into the lungs 2 (two) times daily. 01/05/19   Martyn Ehrich, NP  Multiple Vitamin (MULTIVITAMIN WITH MINERALS) TABS tablet Take 1 tablet by mouth daily.    [provider]  pantoprazole (PROTONIX) 40 MG tablet 40 mg one tab 30 minutes before breakfast meal 10/04/18   Martyn Ehrich, NP  tiotropium (SPIRIVA) 18 MCG inhalation capsule Place 1 capsule (18 mcg total) into inhaler and inhale daily. 01/05/19   Martyn Ehrich, NP     Vital Signs: Blood pressure 122/82, temperature 97.7, heart rate 103, respirations 17, O2 sat 100% room air.   Physical Exam awake, alert.  Chest clear to auscultation bilaterally.  Heart with regular rate and rhythm.  Abdomen soft, positive bowel sounds, some mild right upper quadrant/epigastric tenderness to palpation.  No lower extremity edema.  Imaging: No results found.  Labs:  CBC: Recent Labs    08/26/18 1115 02/18/19 1225 03/24/19 1011 05/04/19 1038  WBC 4.5 4.8 4.3 4.7  HGB 15.2 13.5 13.5 12.9*  HCT 44.7 36.6* 37.7* 35.9*  PLT 106* 57* 65* 58*    COAGS: No results for input(s): INR, APTT in the last 8760 hours.  BMP: Recent Labs    08/26/18 1115 02/18/19 1225  03/24/19 1011 05/04/19 1038  NA 139 135 143 137  K 3.9 3.6 3.8 3.4*  CL 106 101 111 106  CO2 24 24 24 24   GLUCOSE 79 127* 113* 156*  BUN 6* <4* <4* <4*  CALCIUM 9.6 8.3* 8.2* 7.9*  CREATININE 0.90 0.77 0.80 0.79  GFRNONAA >60 >60 >60 >60  GFRAA >60 >60 >60 >60    LIVER FUNCTION TESTS: Recent Labs    08/26/18 1115 02/18/19 1225 03/24/19 1011 05/04/19 1038  BILITOT 0.7 2.2* 1.3* 1.5*  AST 45* 65* 52* 60*  ALT 28 23 20 23   ALKPHOS 125 158* 146* 134*  PROT 8.6* 7.6 7.5 7.4  ALBUMIN 3.8 2.7* 2.7* 2.4*    Assessment and Plan: Pt with history of multifocal HCC, hepatitis C and cirrhosis.  He is status post right hepatic Y 90 radioembolization in July 2019  followed by left hepatic Y 90 radioembolization in August 2019.  He is status post bland embolization of a 3 cm right hepatocellular carcinoma on 05/06/2018.  Recent MRI in January of this year demonstrated stable treated right hepatic disease but progression in the left hepatic lobe with 3 new suspicious lesions identified all measuring 2 cm or less in size.  No extrahepatic disease.  Following recent discussions with Dr. Annamaria Boots he presents today for repeat hepatic/visceral arteriogram roadmapping study prior to planned repeat left hepatic lobe Y-90 radioembolization.Risks and benefits of procedure were discussed with the patient including, but not limited to bleeding, infection, vascular injury or contrast induced renal failure.  This interventional procedure involves the use of X-rays and because of the nature of the planned procedure, it is possible that we will have prolonged use of X-ray fluoroscopy.  Potential radiation risks to you include (but are not limited to) the following: - A slightly elevated risk for cancer  several years later in life. This risk is typically less than 0.5% percent. This risk is low in comparison to the normal incidence of human cancer, which is 33% for women and 50% for men according to the [employer]. - Radiation induced injury can include skin redness, resembling a rash, tissue breakdown / ulcers and hair loss (which can be temporary or permanent).   The likelihood of either of these occurring depends on the difficulty of the procedure and whether you are sensitive to radiation due to previous procedures, disease, or genetic conditions.   IF your procedure requires a prolonged use of radiation, you will be notified and given written instructions for further action.  It is your responsibility to monitor the irradiated area for the 2 weeks following the procedure and to notify your physician if you are concerned that you have suffered a radiation induced injury.    All of the patient's questions were answered, patient is agreeable to proceed.  Consent signed and in chart.  T bilirubin today 2.4, plts 65k    Electronically Signed: D. Rowe Robert, PA-C 06/07/2019, 8:53 AM   I spent a total of 25 minutes at the the patient's bedside AND on the patient's hospital floor or unit, greater than 50% of which was counseling/coordinating care for visceral/hepatic arteriogram with possible Y 90 test dosing

## 2019-06-08 LAB — AFP TUMOR MARKER: AFP, Serum, Tumor Marker: 337 ng/mL — ABNORMAL HIGH (ref 0.0–8.3)

## 2019-06-08 LAB — CEA: CEA: 14 ng/mL — ABNORMAL HIGH (ref 0.0–4.7)

## 2019-06-17 ENCOUNTER — Other Ambulatory Visit: Payer: Self-pay | Admitting: Radiology

## 2019-06-17 ENCOUNTER — Other Ambulatory Visit: Payer: Self-pay | Admitting: Physician Assistant

## 2019-06-20 ENCOUNTER — Ambulatory Visit (HOSPITAL_COMMUNITY)
Admission: RE | Admit: 2019-06-20 | Discharge: 2019-06-20 | Disposition: A | Discharge status: Home / Self Care | Payer: Medicare HMO | Source: Ambulatory Visit | Attending: Interventional Radiology | Admitting: Interventional Radiology

## 2019-06-20 ENCOUNTER — Encounter (HOSPITAL_COMMUNITY)
Admission: RE | Admit: 2019-06-20 | Discharge: 2019-06-20 | Disposition: A | Discharge status: Home / Self Care | Payer: Medicare HMO | Source: Ambulatory Visit | Attending: Interventional Radiology | Admitting: Interventional Radiology

## 2019-06-20 ENCOUNTER — Other Ambulatory Visit (HOSPITAL_COMMUNITY): Payer: Self-pay | Admitting: Interventional Radiology

## 2019-06-20 ENCOUNTER — Other Ambulatory Visit: Payer: Self-pay

## 2019-06-20 ENCOUNTER — Encounter (HOSPITAL_COMMUNITY): Payer: Self-pay

## 2019-06-20 DIAGNOSIS — C220 Liver cell carcinoma: Secondary | ICD-10-CM

## 2019-06-20 DIAGNOSIS — Z7982 Long term (current) use of aspirin: Secondary | ICD-10-CM | POA: Insufficient documentation

## 2019-06-20 DIAGNOSIS — Z79899 Other long term (current) drug therapy: Secondary | ICD-10-CM | POA: Diagnosis not present

## 2019-06-20 DIAGNOSIS — B1920 Unspecified viral hepatitis C without hepatic coma: Secondary | ICD-10-CM | POA: Diagnosis not present

## 2019-06-20 DIAGNOSIS — I739 Peripheral vascular disease, unspecified: Secondary | ICD-10-CM | POA: Insufficient documentation

## 2019-06-20 DIAGNOSIS — C787 Secondary malignant neoplasm of liver and intrahepatic bile duct: Secondary | ICD-10-CM | POA: Diagnosis not present

## 2019-06-20 DIAGNOSIS — K7460 Unspecified cirrhosis of liver: Secondary | ICD-10-CM | POA: Diagnosis not present

## 2019-06-20 DIAGNOSIS — I2510 Atherosclerotic heart disease of native coronary artery without angina pectoris: Secondary | ICD-10-CM | POA: Diagnosis not present

## 2019-06-20 DIAGNOSIS — F329 Major depressive disorder, single episode, unspecified: Secondary | ICD-10-CM | POA: Diagnosis not present

## 2019-06-20 DIAGNOSIS — I2583 Coronary atherosclerosis due to lipid rich plaque: Secondary | ICD-10-CM | POA: Diagnosis not present

## 2019-06-20 HISTORY — PX: IR US GUIDE VASC ACCESS RIGHT: IMG2390

## 2019-06-20 HISTORY — PX: IR ANGIOGRAM SELECTIVE EACH ADDITIONAL VESSEL: IMG667

## 2019-06-20 HISTORY — PX: IR ANGIOGRAM VISCERAL SELECTIVE: IMG657

## 2019-06-20 HISTORY — PX: IR EMBO TUMOR ORGAN ISCHEMIA INFARCT INC GUIDE ROADMAPPING: IMG5449

## 2019-06-20 LAB — CBC WITH DIFFERENTIAL/PLATELET
Abs Immature Granulocytes: 0.01 10*3/uL (ref 0.00–0.07)
Basophils Absolute: 0 10*3/uL (ref 0.0–0.1)
Basophils Relative: 1 %
Eosinophils Absolute: 0.1 10*3/uL (ref 0.0–0.5)
Eosinophils Relative: 2 %
HCT: 36.1 % — ABNORMAL LOW (ref 39.0–52.0)
Hemoglobin: 12.7 g/dL — ABNORMAL LOW (ref 13.0–17.0)
Immature Granulocytes: 0 %
Lymphocytes Relative: 24 %
Lymphs Abs: 0.9 10*3/uL (ref 0.7–4.0)
MCH: 38.5 pg — ABNORMAL HIGH (ref 26.0–34.0)
MCHC: 35.2 g/dL (ref 30.0–36.0)
MCV: 109.4 fL — ABNORMAL HIGH (ref 80.0–100.0)
Monocytes Absolute: 0.4 10*3/uL (ref 0.1–1.0)
Monocytes Relative: 11 %
Neutro Abs: 2.2 10*3/uL (ref 1.7–7.7)
Neutrophils Relative %: 62 %
Platelets: UNDETERMINED 10*3/uL (ref 150–400)
RBC: 3.3 MIL/uL — ABNORMAL LOW (ref 4.22–5.81)
RDW: 17 % — ABNORMAL HIGH (ref 11.5–15.5)
WBC: 3.6 10*3/uL — ABNORMAL LOW (ref 4.0–10.5)
nRBC: 0 % (ref 0.0–0.2)

## 2019-06-20 LAB — COMPREHENSIVE METABOLIC PANEL
ALT: 18 U/L (ref 0–44)
AST: 48 U/L — ABNORMAL HIGH (ref 15–41)
Albumin: 2.3 g/dL — ABNORMAL LOW (ref 3.5–5.0)
Alkaline Phosphatase: 116 U/L (ref 38–126)
Anion gap: 8 (ref 5–15)
BUN: <5 mg/dL — ABNORMAL LOW (ref 8–23)
CO2: 24 mmol/L (ref 22–32)
Calcium: 7.9 mg/dL — ABNORMAL LOW (ref 8.9–10.3)
Chloride: 107 mmol/L (ref 98–111)
Creatinine, Ser: 0.68 mg/dL (ref 0.61–1.24)
GFR calc Af Amer: >60 mL/min (ref >60)
GFR calc non Af Amer: >60 mL/min (ref >60)
Glucose, Bld: 99 mg/dL (ref 70–99)
Potassium: 4.5 mmol/L (ref 3.5–5.1)
Sodium: 139 mmol/L (ref 135–145)
Total Bilirubin: 1.8 mg/dL — ABNORMAL HIGH (ref 0.3–1.2)
Total Protein: 6.9 g/dL (ref 6.5–8.1)

## 2019-06-20 LAB — PROTIME-INR
INR: 1.6 — ABNORMAL HIGH (ref 0.8–1.2)
Prothrombin Time: 18.6 seconds — ABNORMAL HIGH (ref 11.4–15.2)

## 2019-06-20 MED ORDER — IOHEXOL 300 MG/ML  SOLN
100.0000 mL | Freq: Once | INTRAMUSCULAR | Status: AC | PRN
  Administered 2019-06-20: 5 mL via INTRA_ARTERIAL

## 2019-06-20 MED ORDER — MIDAZOLAM HCL 2 MG/2ML IJ SOLN
INTRAMUSCULAR | Status: AC
  Filled 2019-06-20: qty 6

## 2019-06-20 MED ORDER — HYDROCODONE-ACETAMINOPHEN 5-325 MG PO TABS: 1.0000 | ORAL_TABLET | ORAL | Status: DC | PRN

## 2019-06-20 MED ORDER — MIDAZOLAM HCL 5 MG/5ML IJ SOLN
INTRAMUSCULAR | Status: AC | PRN
  Administered 2019-06-20: 1 mg via INTRAVENOUS

## 2019-06-20 MED ORDER — IOHEXOL 300 MG/ML  SOLN
100.0000 mL | Freq: Once | INTRAMUSCULAR | Status: AC | PRN
  Administered 2019-06-20: 15 mL via INTRA_ARTERIAL

## 2019-06-20 MED ORDER — FENTANYL CITRATE (PF) 100 MCG/2ML IJ SOLN
INTRAMUSCULAR | Status: AC | PRN
  Administered 2019-06-20 (×2): 50 ug via INTRAVENOUS

## 2019-06-20 MED ORDER — DEXAMETHASONE SODIUM PHOSPHATE 10 MG/ML IJ SOLN
8.0000 mg | Freq: Once | INTRAMUSCULAR | Status: AC
  Administered 2019-06-20: 8 mg via INTRAVENOUS
  Filled 2019-06-20: qty 1

## 2019-06-20 MED ORDER — PANTOPRAZOLE SODIUM 40 MG IV SOLR
40.0000 mg | Freq: Once | INTRAVENOUS | Status: AC
  Administered 2019-06-20: 40 mg via INTRAVENOUS
  Filled 2019-06-20: qty 40

## 2019-06-20 MED ORDER — LIDOCAINE HCL (PF) 1 % IJ SOLN
INTRAMUSCULAR | Status: AC | PRN
  Administered 2019-06-20: 5 mL

## 2019-06-20 MED ORDER — PIPERACILLIN-TAZOBACTAM 3.375 G IVPB 30 MIN
3.3750 g | Freq: Once | INTRAVENOUS | Status: AC
  Administered 2019-06-20: 3.375 g via INTRAVENOUS
  Filled 2019-06-20: qty 50

## 2019-06-20 MED ORDER — YTTRIUM 90 INJECTION: 23.7000 | INJECTION | Freq: Once | INTRAVENOUS | Status: DC | PRN

## 2019-06-20 MED ORDER — IOHEXOL 300 MG/ML  SOLN
100.0000 mL | Freq: Once | INTRAMUSCULAR | Status: AC | PRN
  Administered 2019-06-20: 60 mL via INTRA_ARTERIAL

## 2019-06-20 MED ORDER — SODIUM CHLORIDE 0.9 % IV SOLN
8.0000 mg | Freq: Once | INTRAVENOUS | Status: AC
  Administered 2019-06-20: 8 mg via INTRAVENOUS
  Filled 2019-06-20: qty 4

## 2019-06-20 MED ORDER — SODIUM CHLORIDE 0.9 % IV SOLN: INTRAVENOUS | Status: DC

## 2019-06-20 MED ORDER — FENTANYL CITRATE (PF) 100 MCG/2ML IJ SOLN
INTRAMUSCULAR | Status: AC
  Filled 2019-06-20: qty 4

## 2019-06-20 MED ORDER — MIDAZOLAM HCL 2 MG/2ML IJ SOLN
INTRAMUSCULAR | Status: AC | PRN
  Administered 2019-06-20: 1 mg via INTRAVENOUS

## 2019-06-20 MED ORDER — LIDOCAINE HCL 1 % IJ SOLN
INTRAMUSCULAR | Status: AC
  Filled 2019-06-20: qty 20

## 2019-06-20 NOTE — Discharge Instructions (Signed)
Please call Interventional Radiology clinic [phone] with any questions   You may remove your dressing and shower tomorrow.  Moderate Conscious Sedation, Adult, Care After These instructions provide you with information about caring for yourself after your procedure. Your health care provider may also give you more specific instructions. Your treatment has been planned according to current medical practices, but problems sometimes occur. Call your health care provider if you have any problems or questions after your procedure. What can I expect after the procedure? After your procedure, it is common:  To feel sleepy for several hours.  To feel clumsy and have poor balance for several hours.  To have poor judgment for several hours.  To vomit if you eat too soon. Follow these instructions at home: For at least 24 hours after the procedure:   Do not: ? Participate in activities where you could fall or become injured. ? Drive. ? Use heavy machinery. ? Drink alcohol. ? Take sleeping pills or medicines that cause drowsiness. ? Make important decisions or sign legal documents. ? Take care of children on your own.  Rest. Eating and drinking  Follow the diet recommended by your health care provider.  If you vomit: ? Drink water, juice, or soup when you can drink without vomiting. ? Make sure you have little or no nausea before eating solid foods. General instructions  Have a responsible adult stay with you until you are awake and alert.  Take over-the-counter and prescription medicines only as told by your health care provider.  If you smoke, do not smoke without supervision.  Keep all follow-up visits as told by your health care provider. This is important. Contact a health care provider if:  You keep feeling nauseous or you keep vomiting.  You feel light-headed.  You develop a rash.  You have a fever. Get help right away if:  You have trouble breathing. This  information is not intended to replace advice given to you by your health care provider. Make sure you discuss any questions you have with your health care provider. Document Revised: 03/13/2017 Document Reviewed: 07/21/2015 Elsevier Patient Education  [place].   Hepatic Artery Radioembolization, Care After This sheet gives you information about how to care for yourself after your procedure. Your health care provider may also give you more specific instructions. If you have problems or questions, contact your health care provider. What can I expect after the procedure? After the procedure, it is common to have:  A slight fever for 1-2 weeks. If your fever gets worse, tell your health care provider.  Fatigue.  Loss of appetite. This should gradually improve after about 1 week.  Abdominal pain on your right side.  Soreness and tenderness in your groin area where the needle and catheter were placed (puncture site). Follow these instructions at home:  Puncture site care  Follow instructions from your health care provider about how to take care of the puncture site. Make sure you: ? Wash your hands with soap and water before you change your bandage (dressing). If soap and water are not available, use hand sanitizer. ? Change your dressing as told by your health care provider. ? Leave stitches (sutures), skin glue, or adhesive strips in place. These skin closures may need to stay in place for 2 weeks or longer. If adhesive strip edges start to loosen and curl up, you may trim the loose edges. Do not remove adhesive strips completely unless your health care provider tells you to  do that.  Check your puncture site every day for signs of infection. Check for: ? More redness, swelling, or pain. ? More fluid or blood. ? Warmth. ? Pus or a bad smell. Activity  Rest and return to your normal activities as told by your health care provider. Ask your health care provider what  activities are safe for you.  Do not drive for 24 hours after the procedure if you were given a medicine to help you relax (sedative).  Do not lift anything that is heavier than 10 lb (4.5 kg) until your health care provider says that it is safe. Medicines  Take over-the-counter and prescription medicines only as told by your health care provider.  Do not drive or use heavy machinery while taking prescription pain medicine. Radiation precautions  For up to a week after your procedure, there will be a small amount of radioactivity near your liver. This is not especially dangerous to other people. However, you should follow these precautions for 7 days: ? Do not come in close contact with people. ? Do not sleep in the same bed as someone else. ? Do not hold children or babies. ? Do not have contact with pregnant women. General instructions  To prevent or treat constipation while you are taking prescription pain medicine, your health care provider may recommend that you: ? Drink enough fluid to keep your urine clear or pale yellow. ? Take over-the-counter or prescription medicines. ? Eat foods that are high in fiber, such as fresh fruits and vegetables, whole grains, and beans. ? Limit foods that are high in fat and processed sugars, such as fried and sweet foods.  Eat frequent small meals until your appetite returns. Follow instructions from your health care provider about eating or drinking restrictions.  Do not take baths, swim, or use a hot tub until your health care provider approves. You may take showers. Wash your puncture site with mild soap and water and pat the area dry.  Wear compression stockings as told by your health care provider. These stockings help to prevent blood clots and reduce swelling in your legs.  Keep all follow-up visits as told by your health care provider. This is important. You may need to have blood tests and imaging tests done. Contact a health care  provider if:  You have more redness, swelling, or pain around your puncture site.  You have more fluid or blood coming from your puncture site.  Your puncture site feels warm to the touch.  You have pus or a bad smell coming from your puncture site.  You have pain that: ? Gets worse. ? Does not get better with medicine. ? Feels like very bad heartburn. ? Is in the middle of your abdomen, above your belly button.  Your skin and the white parts of your eyes turn yellow (jaundice).  The color of your urine changes to dark brown.  The color of your stool changes to light yellow.  Your abdominal measurement (girth) increases in a short period of time.  You gain more than 5 lb (2.3 kg) in a short period of time. Get help right away if:  You have a fever that lasts longer than 2 weeks or is higher than what your health care provider told you to expect.  You develop any of the following in your legs: ? Pain. ? Swelling. ? Skin that is cold or pale or turns blue.  You have chest pain.  You have blood in your  vomit, saliva, or stool.  You have trouble breathing. This information is not intended to replace advice given to you by your health care provider. Make sure you discuss any questions you have with your health care provider. Document Revised: 03/13/2017 Document Reviewed: 12/29/2015 Elsevier Patient Education  [place] Radioembolization Discharge Instructions  You have been given a radioactive material during your procedure.  While it is safe for you to be discharged home from the hospital, you need to proceed directly home.    Do not use public transportation, including air travel, lasting more than 2 hours for 1 week.  Avoid crowded public places for 1 week.  Adult visitors should try to avoid close contact with you for 1 week.    Children and pregnant females should not visit or have close contact with you for 1 week.  Items that you touch  are not radioactive.  Do not sleep in the same bed as your partner for 1 week, and a condom should be used for sexual activity during the first 24 hours.  Your blood may be radioactive and caution should be used if any bleeding occurs during the recovery period.  Body fluids may be radioactive for 24 hours.  Wash your hands after voiding.  Men should sit to urinate.  Dispose of any soiled materials (flush down toilet or place in trash at home) during the first day.  Drink 6 to 8 glasses of fluids per day for 5 days to hydrate yourself.  If you need to see a doctor during the first week, you must let them know that you were treated with yttrium-90 microspheres, and will be slightly radioactive.  They can call Interventional Radiology [phone] with any questions.

## 2019-06-20 NOTE — Procedures (Signed)
Interventional Radiology Procedure Note  Procedure: peripheral left hep Y90 for recurrent multifocal HCC  Complications: None  Estimated Blood Loss: min  Findings: Successful 22 mci dose to the inferior peripheral left hepatic branch

## 2019-06-20 NOTE — Sedation Documentation (Signed)
TRANSFERRED TO [facility] FOR SCAN

## 2019-06-20 NOTE — H&P (Signed)
Referring Physician(s): Drazek,D  Supervising Physician: Daryll Brod  Patient Status:  [facility]  Chief Complaint: Multifocal hepatocellular carcinoma   Subjective: Patient familiar to IR service from prior right hepatic Y- 90 radio embolization on 11/06/2017 and left hepatic Y- 90 radioembolization on 12/04/2017.  He has also undergone bland embolization of a 3 cm right hepatocellular carcinoma on 05/06/2018.  He has a history of multifocal HCC, hepatitis C and cirrhosis.  Recent MRI in January of this year demonstrated stable treated right hepatic disease but progression in the left hepatic lobe with 3 new suspicious lesions identified all measuring 2 cm or less in size.  No extrahepatic disease.  Following recent discussions with Dr. Annamaria Boots he presents today for repeat  left hepatic lobe Y-90 radioembolization.  He currently denies fever, headache, chest pain, dyspnea, cough, back pain, nausea, vomiting or bleeding.  He does have some episodes of dizziness/balance difficulties when rising from a seated or lying position to upright position.  Past Medical History:  Diagnosis Date  . Alcohol dependence ([place])   . Anemia    denies  . Arthritis   . Cataract   . Cirrhosis ([place])   . Coronary artery disease due to lipid rich plaque   . Depression   . Dyspnea   . Hepatitis C    Has A and B also  . PAD (peripheral artery disease) ([place])    pt reports he has this and has chronic leg "soreness"  . Peripheral vascular disease ([place])    essentially normal circulations with two-vessel  runoff to the feet bilaterally  . Tobacco abuse    Past Surgical History:  Procedure Laterality Date  . CARDIAC CATHETERIZATION    . COLONOSCOPY    . CORONARY ARTERY BYPASS GRAFT  2010   Triple bypass at [facility]  . DOPPLER ECHOCARDIOGRAPHY  2011  . IR ANGIOGRAM SELECTIVE EACH ADDITIONAL VESSEL  10/27/2017  . IR ANGIOGRAM SELECTIVE EACH ADDITIONAL VESSEL  10/27/2017  . IR ANGIOGRAM SELECTIVE EACH ADDITIONAL  VESSEL  10/27/2017  . IR ANGIOGRAM SELECTIVE EACH ADDITIONAL VESSEL  10/27/2017  . IR ANGIOGRAM SELECTIVE EACH ADDITIONAL VESSEL  10/27/2017  . IR ANGIOGRAM SELECTIVE EACH ADDITIONAL VESSEL  10/27/2017  . IR ANGIOGRAM SELECTIVE EACH ADDITIONAL VESSEL  10/27/2017  . IR ANGIOGRAM SELECTIVE EACH ADDITIONAL VESSEL  11/06/2017  . IR ANGIOGRAM SELECTIVE EACH ADDITIONAL VESSEL  11/06/2017  . IR ANGIOGRAM SELECTIVE EACH ADDITIONAL VESSEL  11/06/2017  . IR ANGIOGRAM SELECTIVE EACH ADDITIONAL VESSEL  12/04/2017  . IR ANGIOGRAM SELECTIVE EACH ADDITIONAL VESSEL  12/04/2017  . IR ANGIOGRAM SELECTIVE EACH ADDITIONAL VESSEL  05/06/2018  . IR ANGIOGRAM SELECTIVE EACH ADDITIONAL VESSEL  05/06/2018  . IR ANGIOGRAM SELECTIVE EACH ADDITIONAL VESSEL  05/06/2018  . IR ANGIOGRAM SELECTIVE EACH ADDITIONAL VESSEL  05/06/2018  . IR ANGIOGRAM SELECTIVE EACH ADDITIONAL VESSEL  06/07/2019  . IR ANGIOGRAM SELECTIVE EACH ADDITIONAL VESSEL  06/07/2019  . IR ANGIOGRAM SELECTIVE EACH ADDITIONAL VESSEL  06/07/2019  . IR ANGIOGRAM VISCERAL SELECTIVE  10/27/2017  . IR ANGIOGRAM VISCERAL SELECTIVE  11/06/2017  . IR ANGIOGRAM VISCERAL SELECTIVE  12/04/2017  . IR ANGIOGRAM VISCERAL SELECTIVE  05/06/2018  . IR ANGIOGRAM VISCERAL SELECTIVE  06/07/2019  . IR EMBO ARTERIAL NOT HEMORR HEMANG INC GUIDE ROADMAPPING  10/27/2017  . IR EMBO TUMOR ORGAN ISCHEMIA INFARCT INC GUIDE ROADMAPPING  11/06/2017  . IR EMBO TUMOR ORGAN ISCHEMIA INFARCT INC GUIDE ROADMAPPING  12/04/2017  . IR EMBO TUMOR ORGAN ISCHEMIA INFARCT INC GUIDE ROADMAPPING  05/06/2018  . IR RADIOLOGIST EVAL & MGMT  08/12/2017  . IR RADIOLOGIST EVAL & MGMT  09/22/2017  . IR RADIOLOGIST EVAL & MGMT  01/07/2018  . IR RADIOLOGIST EVAL & MGMT  04/20/2018  . IR RADIOLOGIST EVAL & MGMT  11/18/2018  . IR RADIOLOGIST EVAL & MGMT  05/12/2019  . IR US GUIDE VASC ACCESS RIGHT  10/27/2017  . IR US GUIDE VASC ACCESS RIGHT  11/06/2017  . IR US GUIDE VASC ACCESS RIGHT  12/04/2017  . IR US GUIDE VASC ACCESS RIGHT   05/06/2018  . IR US GUIDE VASC ACCESS RIGHT  06/07/2019  . NM MYOVIEW LTD  2012      Allergies: Statins  Medications: Prior to Admission medications   Medication Sig Start Date End Date Taking? Authorizing Provider  albuterol (PROVENTIL HFA;VENTOLIN HFA) 108 (90 Base) MCG/ACT inhaler Inhale 2 puffs into the lungs every 6 (six) hours as needed for wheezing or shortness of breath. 02/15/18  Yes Chesley Mires, MD  aspirin 81 MG tablet Take 81 mg by mouth daily.   Yes [provider]  Cholecalciferol (VITAMIN D3 PO) Take 125 mcg by mouth daily.   Yes [provider]  LORazepam (ATIVAN) 0.5 MG tablet Take 1 tablet (0.5 mg) approximately 30 minutes prior to MRI. If still anxious, may take additional 1 tablet (0.5 mg). 02/18/19  Yes Truitt Merle, MD  mometasone-formoterol [facility]) 200-5 MCG/ACT AERO Inhale 2 puffs into the lungs 2 (two) times daily. 01/05/19  Yes Martyn Ehrich, NP  Multiple Vitamin (MULTIVITAMIN WITH MINERALS) TABS tablet Take 1 tablet by mouth daily.   Yes [provider]  pantoprazole (PROTONIX) 40 MG tablet 40 mg one tab 30 minutes before breakfast meal 10/04/18  Yes Martyn Ehrich, NP  Turmeric (QC TUMERIC COMPLEX) 500 MG CAPS Take by mouth daily.   Yes [provider]  zinc gluconate 50 MG tablet Take 50 mg by mouth daily.   Yes [provider]  tiotropium (SPIRIVA) 18 MCG inhalation capsule Place 1 capsule (18 mcg total) into inhaler and inhale daily. 01/05/19   Martyn Ehrich, NP     Vital Signs: BP (!) 143/76   Pulse 90   Temp 98 F (36.7 C) (Oral)   Resp 18   SpO2 99%   Physical Exam awake, alert.  Chest clear to auscultation bilaterally.  Heart with regular rate and rhythm.  Abdomen soft, positive bowel sounds, some mild epigastric tenderness to palpation.  No lower extremity edema.  Imaging: No results found.  Labs:  CBC: Recent Labs    02/18/19 1225 03/24/19 1011 05/04/19 1038 06/07/19 0810  WBC 4.8  4.3 4.7 3.7*  HGB 13.5 13.5 12.9* 13.7  HCT 36.6* 37.7* 35.9* 38.6*  PLT 57* 65* 58* 65*    COAGS: Recent Labs    06/07/19 0810  INR 1.4*    BMP: Recent Labs    02/18/19 1225 03/24/19 1011 05/04/19 1038 06/07/19 0810  NA 135 143 137 140  K 3.6 3.8 3.4* 3.5  CL 101 111 106 106  CO2 24 24 24  21*  GLUCOSE 127* 113* 156* 99  BUN <4* <4* <4* <5*  CALCIUM 8.3* 8.2* 7.9* 8.6*  CREATININE 0.77 0.80 0.79 0.73  GFRNONAA >60 >60 >60 >60  GFRAA >60 >60 >60 >60    LIVER FUNCTION TESTS: Recent Labs    02/18/19 1225 03/24/19 1011 05/04/19 1038 06/07/19 0810  BILITOT 2.2* 1.3* 1.5* 2.4*  AST 65* 52* 60* 60*  ALT  23 20 23 22   ALKPHOS 158* 146* 134* 143*  PROT 7.6 7.5 7.4 8.2*  ALBUMIN 2.7* 2.7* 2.4* 2.9*    Assessment and Plan: Pt with history of multifocal HCC, hepatitis C and cirrhosis.  He is status post right hepatic Y 90 radioembolization in July 2019 followed by left hepatic Y 90 radioembolization in August 2019.  He is status post bland embolization of a 3 cm right hepatocellular carcinoma on 05/06/2018.  Recent MRI in January of this year demonstrated stable treated right hepatic disease but progression in the left hepatic lobe with 3 new suspicious lesions identified all measuring 2 cm or less in size.  No extrahepatic disease.  Following recent discussions with Dr. Annamaria Boots he presents today for repeat  left hepatic lobe Y-90 radioembolization.Risks and benefits of procedure were discussed with the patient including, but not limited to bleeding, infection, vascular injury or contrast induced renal failure.  This interventional procedure involves the use of X-rays and because of the nature of the planned procedure, it is possible that we will have prolonged use of X-ray fluoroscopy.  Potential radiation risks to you include (but are not limited to) the following: - A slightly elevated risk for cancer                    several years later in life. This risk is typically  less than 0.5% percent. This risk is low in comparison to the normal incidence of human cancer, which is 33% for women and 50% for men according to the [place]. - Radiation induced injury can include skin redness, resembling a rash, tissue breakdown / ulcers and hair loss (which can be temporary or permanent).   The likelihood of either of these occurring depends on the difficulty of the procedure and whether you are sensitive to radiation due to previous procedures, disease, or genetic conditions.   IF your procedure requires a prolonged use of radiation, you will be notified and given written instructions for further action.  It is your responsibility to monitor the irradiated area for the 2 weeks following the procedure and to notify your physician if you are concerned that you have suffered a radiation induced injury.    All of the patient's questions were answered, patient is agreeable to proceed.  Consent signed and in chart.   LABS PENDING   Electronically Signed: D. Rowe Robert, PA-C 06/20/2019, 9:23 AM   I spent a total of 25 minutes at the the patient's bedside AND on the patient's hospital floor or unit, greater than 50% of which was counseling/coordinating care for hepatic/visceral arteriogram with left hepatic lobe Y 90 radioembolization

## 2019-06-21 LAB — AFP TUMOR MARKER: AFP, Serum, Tumor Marker: 330 ng/mL — ABNORMAL HIGH (ref 0.0–8.3)

## 2019-06-21 LAB — CEA: CEA: 13.6 ng/mL — ABNORMAL HIGH (ref 0.0–4.7)

## 2019-06-27 ENCOUNTER — Telehealth: Payer: Self-pay | Admitting: Internal Medicine

## 2019-06-27 NOTE — Telephone Encounter (Signed)
lmtcb schedule AWV

## 2019-07-01 ENCOUNTER — Other Ambulatory Visit: Payer: Self-pay | Admitting: Interventional Radiology

## 2019-07-01 DIAGNOSIS — C220 Liver cell carcinoma: Secondary | ICD-10-CM

## 2019-07-12 ENCOUNTER — Ambulatory Visit
Admission: RE | Admit: 2019-07-12 | Discharge: 2019-07-12 | Disposition: A | Discharge status: Home / Self Care | Payer: Medicare HMO | Source: Ambulatory Visit | Attending: Interventional Radiology | Admitting: Interventional Radiology

## 2019-07-12 ENCOUNTER — Emergency Department (HOSPITAL_COMMUNITY): Payer: Medicare HMO

## 2019-07-12 ENCOUNTER — Other Ambulatory Visit: Payer: Self-pay

## 2019-07-12 ENCOUNTER — Encounter: Payer: Self-pay | Admitting: *Deleted

## 2019-07-12 ENCOUNTER — Emergency Department (HOSPITAL_COMMUNITY)
Admission: EM | Admit: 2019-07-12 | Discharge: 2019-07-12 | Disposition: A | Discharge status: Home / Self Care | Payer: Medicare HMO | Attending: Emergency Medicine | Admitting: Emergency Medicine

## 2019-07-12 DIAGNOSIS — Z9889 Other specified postprocedural states: Secondary | ICD-10-CM | POA: Diagnosis not present

## 2019-07-12 DIAGNOSIS — R112 Nausea with vomiting, unspecified: Secondary | ICD-10-CM | POA: Diagnosis not present

## 2019-07-12 DIAGNOSIS — R1110 Vomiting, unspecified: Secondary | ICD-10-CM | POA: Diagnosis not present

## 2019-07-12 DIAGNOSIS — E860 Dehydration: Secondary | ICD-10-CM

## 2019-07-12 DIAGNOSIS — R1310 Dysphagia, unspecified: Secondary | ICD-10-CM | POA: Diagnosis not present

## 2019-07-12 DIAGNOSIS — R197 Diarrhea, unspecified: Secondary | ICD-10-CM | POA: Diagnosis not present

## 2019-07-12 DIAGNOSIS — F17210 Nicotine dependence, cigarettes, uncomplicated: Secondary | ICD-10-CM | POA: Insufficient documentation

## 2019-07-12 DIAGNOSIS — J439 Emphysema, unspecified: Secondary | ICD-10-CM | POA: Diagnosis not present

## 2019-07-12 DIAGNOSIS — C220 Liver cell carcinoma: Secondary | ICD-10-CM | POA: Diagnosis not present

## 2019-07-12 DIAGNOSIS — C229 Malignant neoplasm of liver, not specified as primary or secondary: Secondary | ICD-10-CM | POA: Diagnosis not present

## 2019-07-12 DIAGNOSIS — R630 Anorexia: Secondary | ICD-10-CM | POA: Diagnosis not present

## 2019-07-12 HISTORY — PX: IR RADIOLOGIST EVAL & MGMT: IMG5224

## 2019-07-12 LAB — COMPREHENSIVE METABOLIC PANEL
ALT: 23 U/L (ref 0–44)
AST: 58 U/L — ABNORMAL HIGH (ref 15–41)
Albumin: 2.4 g/dL — ABNORMAL LOW (ref 3.5–5.0)
Alkaline Phosphatase: 98 U/L (ref 38–126)
Anion gap: 14 (ref 5–15)
BUN: <5 mg/dL — ABNORMAL LOW (ref 8–23)
CO2: 19 mmol/L — ABNORMAL LOW (ref 22–32)
Calcium: 8.3 mg/dL — ABNORMAL LOW (ref 8.9–10.3)
Chloride: 105 mmol/L (ref 98–111)
Creatinine, Ser: 0.63 mg/dL (ref 0.61–1.24)
GFR calc Af Amer: >60 mL/min (ref >60)
GFR calc non Af Amer: >60 mL/min (ref >60)
Glucose, Bld: 94 mg/dL (ref 70–99)
Potassium: 3.9 mmol/L (ref 3.5–5.1)
Sodium: 138 mmol/L (ref 135–145)
Total Bilirubin: 2.8 mg/dL — ABNORMAL HIGH (ref 0.3–1.2)
Total Protein: 7 g/dL (ref 6.5–8.1)

## 2019-07-12 LAB — CBC
HCT: 35.9 % — ABNORMAL LOW (ref 39.0–52.0)
Hemoglobin: 13.3 g/dL (ref 13.0–17.0)
MCH: 39 pg — ABNORMAL HIGH (ref 26.0–34.0)
MCHC: 36.9 g/dL — ABNORMAL HIGH (ref 30.0–36.0)
MCV: 105.9 fL — ABNORMAL HIGH (ref 80.0–100.0)
Platelets: 60 10*3/uL — ABNORMAL LOW (ref 150–400)
RBC: 3.39 MIL/uL — ABNORMAL LOW (ref 4.22–5.81)
RDW: 14.9 % (ref 11.5–15.5)
WBC: 4 10*3/uL (ref 4.0–10.5)
nRBC: 0 % (ref 0.0–0.2)

## 2019-07-12 LAB — PROTIME-INR
INR: 1.7 — ABNORMAL HIGH (ref 0.8–1.2)
Prothrombin Time: 20.3 seconds — ABNORMAL HIGH (ref 11.4–15.2)

## 2019-07-12 LAB — LIPASE, BLOOD: Lipase: 47 U/L (ref 11–51)

## 2019-07-12 MED ORDER — SODIUM CHLORIDE 0.9 % IV BOLUS
1000.0000 mL | Freq: Once | INTRAVENOUS | Status: AC
  Administered 2019-07-12: 1000 mL via INTRAVENOUS

## 2019-07-12 MED ORDER — SODIUM CHLORIDE (PF) 0.9 % IJ SOLN
INTRAMUSCULAR | Status: AC
  Filled 2019-07-12: qty 50

## 2019-07-12 MED ORDER — IOHEXOL 300 MG/ML  SOLN
100.0000 mL | Freq: Once | INTRAMUSCULAR | Status: AC | PRN
  Administered 2019-07-12: 100 mL via INTRAVENOUS

## 2019-07-12 MED ORDER — SODIUM CHLORIDE 0.9% FLUSH: 3.0000 mL | Freq: Once | INTRAVENOUS | Status: DC

## 2019-07-12 MED ORDER — ACETAMINOPHEN 500 MG PO TABS
1000.0000 mg | ORAL_TABLET | Freq: Once | ORAL | Status: AC
  Administered 2019-07-12: 1000 mg via ORAL
  Filled 2019-07-12: qty 2

## 2019-07-12 NOTE — Discharge Instructions (Addendum)
You were evaluated in the Emergency Department and after careful evaluation, we did not find any emergent condition requiring admission or further testing in the hospital.  We encourage you to continue following up with your regular doctors to discuss your symptoms.  Please return to the Emergency Department if you experience any worsening of your condition.  We encourage you to follow up with a primary care provider.  Thank you for allowing [place] to be a part of your care.

## 2019-07-12 NOTE — ED Notes (Signed)
Pt A/O and ambulatory at discharge pt verbalized understanding of discharge instructions and follow up care.

## 2019-07-12 NOTE — ED Triage Notes (Addendum)
Patient states he has been vomiting x 1 month. Patient states if he does get something to stay down then he has diarrhea. Patient states his vomit looks like mucus a lot of the time.

## 2019-07-12 NOTE — ED Notes (Signed)
Pt transported to CT ?

## 2019-07-12 NOTE — ED Notes (Signed)
Patient aware we need UA. Patient unable to void at this time.

## 2019-07-12 NOTE — ED Provider Notes (Signed)
[facility] Emergency Department Provider Note MRN:  [id number]  Arrival date & time: 07/12/19     Chief Complaint   Emesis and Diarrhea   History of Present Illness   Zachary Steele is a 69 y.o. year-old male with a history of hepatocellular carcinoma presenting to the ED with chief complaint of emesis and diarrhea.  Progressively worsening issues with eating and drinking over the past 1 or 2 months.  Has a lot of trouble with swallowing food, seems to get caught on the way down at some point.  When some food is able to be swallowed, he experiences profuse diarrhea shortly after.  Has lost about 15 pounds over the past month due to these issues.  Denies fever, no cough, no chest pain or shortness of breath, intermittent abdominal pain with some food fear as the food makes his pain worse.  No dysuria, no night sweats.  Review of Systems  A complete 10 system review of systems was obtained and all systems are negative except as noted in the HPI and PMH.   Patient's Health History    Past Medical History:  Diagnosis Date  . Alcohol dependence ([place])   . Anemia    denies  . Arthritis   . Cataract   . Cirrhosis ([place])   . Coronary artery disease due to lipid rich plaque   . Depression   . Dyspnea   . Hepatitis C    Has A and B also  . PAD (peripheral artery disease) ([place])    pt reports he has this and has chronic leg "soreness"  . Peripheral vascular disease ([place])    essentially normal circulations with two-vessel  runoff to the feet bilaterally  . Tobacco abuse     Past Surgical History:  Procedure Laterality Date  . CARDIAC CATHETERIZATION    . COLONOSCOPY    . CORONARY ARTERY BYPASS GRAFT  2010   Triple bypass at [facility]  . DOPPLER ECHOCARDIOGRAPHY  2011  . IR ANGIOGRAM SELECTIVE EACH ADDITIONAL VESSEL  10/27/2017  . IR ANGIOGRAM SELECTIVE EACH ADDITIONAL VESSEL  10/27/2017  . IR ANGIOGRAM SELECTIVE EACH ADDITIONAL VESSEL  10/27/2017  . IR ANGIOGRAM  SELECTIVE EACH ADDITIONAL VESSEL  10/27/2017  . IR ANGIOGRAM SELECTIVE EACH ADDITIONAL VESSEL  10/27/2017  . IR ANGIOGRAM SELECTIVE EACH ADDITIONAL VESSEL  10/27/2017  . IR ANGIOGRAM SELECTIVE EACH ADDITIONAL VESSEL  10/27/2017  . IR ANGIOGRAM SELECTIVE EACH ADDITIONAL VESSEL  11/06/2017  . IR ANGIOGRAM SELECTIVE EACH ADDITIONAL VESSEL  11/06/2017  . IR ANGIOGRAM SELECTIVE EACH ADDITIONAL VESSEL  11/06/2017  . IR ANGIOGRAM SELECTIVE EACH ADDITIONAL VESSEL  12/04/2017  . IR ANGIOGRAM SELECTIVE EACH ADDITIONAL VESSEL  12/04/2017  . IR ANGIOGRAM SELECTIVE EACH ADDITIONAL VESSEL  05/06/2018  . IR ANGIOGRAM SELECTIVE EACH ADDITIONAL VESSEL  05/06/2018  . IR ANGIOGRAM SELECTIVE EACH ADDITIONAL VESSEL  05/06/2018  . IR ANGIOGRAM SELECTIVE EACH ADDITIONAL VESSEL  05/06/2018  . IR ANGIOGRAM SELECTIVE EACH ADDITIONAL VESSEL  06/07/2019  . IR ANGIOGRAM SELECTIVE EACH ADDITIONAL VESSEL  06/07/2019  . IR ANGIOGRAM SELECTIVE EACH ADDITIONAL VESSEL  06/07/2019  . IR ANGIOGRAM SELECTIVE EACH ADDITIONAL VESSEL  06/20/2019  . IR ANGIOGRAM SELECTIVE EACH ADDITIONAL VESSEL  06/20/2019  . IR ANGIOGRAM VISCERAL SELECTIVE  10/27/2017  . IR ANGIOGRAM VISCERAL SELECTIVE  11/06/2017  . IR ANGIOGRAM VISCERAL SELECTIVE  12/04/2017  . IR ANGIOGRAM VISCERAL SELECTIVE  05/06/2018  . IR ANGIOGRAM VISCERAL SELECTIVE  06/07/2019  . IR ANGIOGRAM VISCERAL SELECTIVE  06/20/2019  . IR EMBO ARTERIAL NOT HEMORR HEMANG INC GUIDE ROADMAPPING  10/27/2017  . IR EMBO TUMOR ORGAN ISCHEMIA INFARCT INC GUIDE ROADMAPPING  11/06/2017  . IR EMBO TUMOR ORGAN ISCHEMIA INFARCT INC GUIDE ROADMAPPING  12/04/2017  . IR EMBO TUMOR ORGAN ISCHEMIA INFARCT INC GUIDE ROADMAPPING  05/06/2018  . IR EMBO TUMOR ORGAN ISCHEMIA INFARCT INC GUIDE ROADMAPPING  06/20/2019  . IR RADIOLOGIST EVAL & MGMT  08/12/2017  . IR RADIOLOGIST EVAL & MGMT  09/22/2017  . IR RADIOLOGIST EVAL & MGMT  01/07/2018  . IR RADIOLOGIST EVAL & MGMT  04/20/2018  . IR RADIOLOGIST EVAL & MGMT  11/18/2018  . IR  RADIOLOGIST EVAL & MGMT  05/12/2019  . IR RADIOLOGIST EVAL & MGMT  07/12/2019  . IR US GUIDE VASC ACCESS RIGHT  10/27/2017  . IR US GUIDE VASC ACCESS RIGHT  11/06/2017  . IR US GUIDE VASC ACCESS RIGHT  12/04/2017  . IR US GUIDE VASC ACCESS RIGHT  05/06/2018  . IR US GUIDE VASC ACCESS RIGHT  06/07/2019  . IR US GUIDE VASC ACCESS RIGHT  06/20/2019  . NM MYOVIEW LTD  2012    Family History  Problem Relation Age of Onset  . Stroke Mother   . Colon cancer Neg Hx     Social History   Socioeconomic History  . Marital status: Married    Spouse name: Not on file  . Number of children: 1  . Years of education: Not on file  . Highest education level: Not on file  Occupational History  . Occupation: Retail buyer    Comment: [employer] [place] [employer]   Tobacco Use  . Smoking status: Current Some Day Smoker    Packs/day: 0.10    Years: 52.00    Pack years: 5.20    Types: Cigarettes  . Smokeless tobacco: Never Used  . Tobacco comment: Is trying to stop smoking, still has one or two per day most days  Substance and Sexual Activity  . Alcohol use: Yes    Comment: quit 04/2017; previous 40 year history 1 wine bottle per day  . Drug use: No  . Sexual activity: Yes  Other Topics Concern  . Not on file  Social History Narrative  . Not on file   Social Determinants of Health   Financial Resource Strain:   . Difficulty of Paying Living Expenses:   Food Insecurity:   . Worried About Charity fundraiser in the Last Year:   . Arboriculturist in the Last Year:   Transportation Needs:   . Film/video editor (Medical):   Marland Kitchen Lack of Transportation (Non-Medical):   Physical Activity:   . Days of Exercise per Week:   . Minutes of Exercise per Session:   Stress:   . Feeling of Stress :   Social Connections:   . Frequency of Communication with Friends and Family:   . Frequency of Social Gatherings with Friends and Family:   . Attends Religious Services:   . Active Member of Clubs or Organizations:     . Attends Archivist Meetings:   Marland Kitchen Marital Status:   Intimate Partner Violence:   . Fear of Current or Ex-Partner:   . Emotionally Abused:   Marland Kitchen Physically Abused:   . Sexually Abused:      Physical Exam   Vitals:   07/12/19 1901 07/12/19 1930  BP: 93/64 106/67  Pulse: 88 85  Resp: (!) 30 (!) 27  Temp:  SpO2: 100% 97%    CONSTITUTIONAL: Chronically ill-appearing, NAD, thin NEURO:  Alert and oriented x 3, no focal deficits EYES:  eyes equal and reactive ENT/NECK:  no LAD, no JVD CARDIO: Tachycardic rate, well-perfused, normal S1 and S2 PULM:  CTAB no wheezing or rhonchi GI/GU:  normal bowel sounds, non-distended, non-tender MSK/SPINE:  No gross deformities, no edema SKIN:  no rash, atraumatic PSYCH:  Appropriate speech and behavior  *Additional and/or pertinent findings included in MDM below  Diagnostic and Interventional Summary    EKG Interpretation  Date/Time:  July 12, 2018 at 17: 23: 35 Ventricular Rate:  92 PR Interval:    QRS Duration: 99   QT Interval:  366 QTC Calculation: 453 R Axis:     Text Interpretation: Sinus rhythm, no significant change from prior Confirmed by Dr. Gerlene Fee at 8:02 PM      Labs Reviewed  COMPREHENSIVE METABOLIC PANEL - Abnormal; Notable for the following components:      Result Value   CO2 19 (*)    BUN <5 (*)    Calcium 8.3 (*)    Albumin 2.4 (*)    AST 58 (*)    Total Bilirubin 2.8 (*)    All other components within normal limits  CBC - Abnormal; Notable for the following components:   RBC 3.39 (*)    HCT 35.9 (*)    MCV 105.9 (*)    MCH 39.0 (*)    MCHC 36.9 (*)    Platelets 60 (*)    All other components within normal limits  PROTIME-INR - Abnormal; Notable for the following components:   Prothrombin Time 20.3 (*)    INR 1.7 (*)    All other components within normal limits  LIPASE, BLOOD  URINALYSIS, ROUTINE W REFLEX MICROSCOPIC    CT CHEST W CONTRAST  Final Result    CT ABDOMEN PELVIS W  CONTRAST  Final Result      Medications  sodium chloride flush (NS) 0.9 % injection 3 mL (3 mLs Intravenous Not Given 07/12/19 1818)  sodium chloride 0.9 % bolus 1,000 mL (1,000 mLs Intravenous New Bag/Given 07/12/19 1730)  sodium chloride 0.9 % bolus 1,000 mL (0 mLs Intravenous Stopped 07/12/19 1857)  acetaminophen (TYLENOL) tablet 1,000 mg (1,000 mg Oral Given 07/12/19 1725)  iohexol (OMNIPAQUE) 300 MG/ML solution 100 mL (100 mLs Intravenous Contrast Given 07/12/19 1735)     Procedures  /  Critical Care Procedures  ED Course and Medical Decision Making  I have reviewed the triage vital signs, the nursing notes, and pertinent available records from the EMR.  Pertinent labs & imaging results that were available during my care of the patient were reviewed by me and considered in my medical decision making (see below for details).     Labs consistent with mild dehydration, some abnormalities noted in LFTs, PT but close to patient's prior values, has known cirrhosis and hepatocellular carcinoma.  No significant tenderness on exam, no fever, some transient soft blood pressures in the 90s that resolved with fluids and further observation.  Patient has close follow-up this week with his hepatologist, appropriate for discharge.  Barth Kirks. Sedonia Small, MD [place] [email or web address]  Final Clinical Impressions(s) / ED Diagnoses     ICD-10-CM   1. Dehydration  E86.0   2. Poor appetite  R63.0     ED Discharge Orders    None       Discharge Instructions Discussed with and Provided  to Patient:     Discharge Instructions     You were evaluated in the Emergency Department and after careful evaluation, we did not find any emergent condition requiring admission or further testing in the hospital.  We encourage you to continue following up with your regular doctors to discuss your symptoms.  Please return to the Emergency Department if you  experience any worsening of your condition.  We encourage you to follow up with a primary care provider.  Thank you for allowing [place] to be a part of your care.       Maudie Flakes, MD 07/12/19 2004

## 2019-07-12 NOTE — Progress Notes (Signed)
Patient ID: Zachary Steele, male   DOB: Apr 02, 1951, 69 y.o.   MRN: [id number]       Chief Complaint:  Multifocal hepatocellular carcinoma  Referring Physician(s): Fengl  History of Present Illness: Zachary Steele is a 70 y.o. male with known chronic hepatitis C, alcoholism, cirrhosis and multifocal hepatocellular carcinoma.  He has had previous bilateral Y 90 embolizations and a peripheral right inferior hepatic bland embolization.  Surveillance MRI demonstrated 3 new lesions in the left hepatic lobe.  Disease in the right lobe was stable.  He underwent a second left hepatic Y 90 embolization 1 month ago at [facility].  From a liver standpoint he seems fairly stable.  Stable functional status.  No flank or abdominal pain.  No significant signs or symptoms of liver failure.  However, he does complain of decreased appetite, associated nausea and vomiting, and poor p.o. intake.  He thinks he is dehydrated and may have contracted the COVID-19 virus.  No recent fevers.  He is concerned enough that his wife is coming home to bring him into the emergency department at [facility] health for evaluation.  Past Medical History:  Diagnosis Date  . Alcohol dependence ([place])   . Anemia    denies  . Arthritis   . Cataract   . Cirrhosis ([place])   . Coronary artery disease due to lipid rich plaque   . Depression   . Dyspnea   . Hepatitis C    Has A and B also  . PAD (peripheral artery disease) ([place])    pt reports he has this and has chronic leg "soreness"  . Peripheral vascular disease ([place])    essentially normal circulations with two-vessel  runoff to the feet bilaterally  . Tobacco abuse     Past Surgical History:  Procedure Laterality Date  . CARDIAC CATHETERIZATION    . COLONOSCOPY    . CORONARY ARTERY BYPASS GRAFT  2010   Triple bypass at [facility]  . DOPPLER ECHOCARDIOGRAPHY  2011  . IR ANGIOGRAM SELECTIVE EACH ADDITIONAL VESSEL  10/27/2017  . IR ANGIOGRAM SELECTIVE EACH ADDITIONAL  VESSEL  10/27/2017  . IR ANGIOGRAM SELECTIVE EACH ADDITIONAL VESSEL  10/27/2017  . IR ANGIOGRAM SELECTIVE EACH ADDITIONAL VESSEL  10/27/2017  . IR ANGIOGRAM SELECTIVE EACH ADDITIONAL VESSEL  10/27/2017  . IR ANGIOGRAM SELECTIVE EACH ADDITIONAL VESSEL  10/27/2017  . IR ANGIOGRAM SELECTIVE EACH ADDITIONAL VESSEL  10/27/2017  . IR ANGIOGRAM SELECTIVE EACH ADDITIONAL VESSEL  11/06/2017  . IR ANGIOGRAM SELECTIVE EACH ADDITIONAL VESSEL  11/06/2017  . IR ANGIOGRAM SELECTIVE EACH ADDITIONAL VESSEL  11/06/2017  . IR ANGIOGRAM SELECTIVE EACH ADDITIONAL VESSEL  12/04/2017  . IR ANGIOGRAM SELECTIVE EACH ADDITIONAL VESSEL  12/04/2017  . IR ANGIOGRAM SELECTIVE EACH ADDITIONAL VESSEL  05/06/2018  . IR ANGIOGRAM SELECTIVE EACH ADDITIONAL VESSEL  05/06/2018  . IR ANGIOGRAM SELECTIVE EACH ADDITIONAL VESSEL  05/06/2018  . IR ANGIOGRAM SELECTIVE EACH ADDITIONAL VESSEL  05/06/2018  . IR ANGIOGRAM SELECTIVE EACH ADDITIONAL VESSEL  06/07/2019  . IR ANGIOGRAM SELECTIVE EACH ADDITIONAL VESSEL  06/07/2019  . IR ANGIOGRAM SELECTIVE EACH ADDITIONAL VESSEL  06/07/2019  . IR ANGIOGRAM SELECTIVE EACH ADDITIONAL VESSEL  06/20/2019  . IR ANGIOGRAM SELECTIVE EACH ADDITIONAL VESSEL  06/20/2019  . IR ANGIOGRAM VISCERAL SELECTIVE  10/27/2017  . IR ANGIOGRAM VISCERAL SELECTIVE  11/06/2017  . IR ANGIOGRAM VISCERAL SELECTIVE  12/04/2017  . IR ANGIOGRAM VISCERAL SELECTIVE  05/06/2018  . IR ANGIOGRAM VISCERAL SELECTIVE  06/07/2019  . IR ANGIOGRAM VISCERAL  SELECTIVE  06/20/2019  . IR EMBO ARTERIAL NOT HEMORR HEMANG INC GUIDE ROADMAPPING  10/27/2017  . IR EMBO TUMOR ORGAN ISCHEMIA INFARCT INC GUIDE ROADMAPPING  11/06/2017  . IR EMBO TUMOR ORGAN ISCHEMIA INFARCT INC GUIDE ROADMAPPING  12/04/2017  . IR EMBO TUMOR ORGAN ISCHEMIA INFARCT INC GUIDE ROADMAPPING  05/06/2018  . IR EMBO TUMOR ORGAN ISCHEMIA INFARCT INC GUIDE ROADMAPPING  06/20/2019  . IR RADIOLOGIST EVAL & MGMT  08/12/2017  . IR RADIOLOGIST EVAL & MGMT  09/22/2017  . IR RADIOLOGIST EVAL & MGMT  01/07/2018    . IR RADIOLOGIST EVAL & MGMT  04/20/2018  . IR RADIOLOGIST EVAL & MGMT  11/18/2018  . IR RADIOLOGIST EVAL & MGMT  05/12/2019  . IR US GUIDE VASC ACCESS RIGHT  10/27/2017  . IR US GUIDE VASC ACCESS RIGHT  11/06/2017  . IR US GUIDE VASC ACCESS RIGHT  12/04/2017  . IR US GUIDE VASC ACCESS RIGHT  05/06/2018  . IR US GUIDE VASC ACCESS RIGHT  06/07/2019  . IR US GUIDE VASC ACCESS RIGHT  06/20/2019  . NM MYOVIEW LTD  2012    Allergies: Statins  Medications: Prior to Admission medications   Medication Sig Start Date End Date Taking? Authorizing Provider  albuterol (PROVENTIL HFA;VENTOLIN HFA) 108 (90 Base) MCG/ACT inhaler Inhale 2 puffs into the lungs every 6 (six) hours as needed for wheezing or shortness of breath. 02/15/18   Chesley Mires, MD  aspirin 81 MG tablet Take 81 mg by mouth daily.    [provider]  Cholecalciferol (VITAMIN D3 PO) Take 125 mcg by mouth daily.    [provider]  LORazepam (ATIVAN) 0.5 MG tablet Take 1 tablet (0.5 mg) approximately 30 minutes prior to MRI. If still anxious, may take additional 1 tablet (0.5 mg). 02/18/19   Truitt Merle, MD  mometasone-formoterol [facility]) 200-5 MCG/ACT AERO Inhale 2 puffs into the lungs 2 (two) times daily. 01/05/19   Martyn Ehrich, NP  Multiple Vitamin (MULTIVITAMIN WITH MINERALS) TABS tablet Take 1 tablet by mouth daily.    [provider]  pantoprazole (PROTONIX) 40 MG tablet 40 mg one tab 30 minutes before breakfast meal 10/04/18   Martyn Ehrich, NP  tiotropium (SPIRIVA) 18 MCG inhalation capsule Place 1 capsule (18 mcg total) into inhaler and inhale daily. 01/05/19   Martyn Ehrich, NP  Turmeric (QC TUMERIC COMPLEX) 500 MG CAPS Take by mouth daily.    [provider]  zinc gluconate 50 MG tablet Take 50 mg by mouth daily.    [provider]     Family History  Problem Relation Age of Onset  . Stroke Mother   . Colon cancer Neg Hx     Social History   Socioeconomic History  .  Marital status: Married    Spouse name: Not on file  . Number of children: 1  . Years of education: Not on file  . Highest education level: Not on file  Occupational History  . Occupation: Retail buyer    Comment: [employer] [place]   Tobacco Use  . Smoking status: Current Some Day Smoker    Packs/day: 0.10    Years: 52.00    Pack years: 5.20    Types: Cigarettes  . Smokeless tobacco: Never Used  . Tobacco comment: Is trying to stop smoking, still has one or two per day most days  Substance and Sexual Activity  . Alcohol use: No    Comment: quit 04/2017; previous 40 year  history 1 wine bottle per day  . Drug use: No    Comment: occ  . Sexual activity: Yes  Other Topics Concern  . Not on file  Social History Narrative  . Not on file   Social Determinants of Health   Financial Resource Strain:   . Difficulty of Paying Living Expenses:   Food Insecurity:   . Worried About Charity fundraiser in the Last Year:   . Arboriculturist in the Last Year:   Transportation Needs:   . Film/video editor (Medical):   Marland Kitchen Lack of Transportation (Non-Medical):   Physical Activity:   . Days of Exercise per Week:   . Minutes of Exercise per Session:   Stress:   . Feeling of Stress :   Social Connections:   . Frequency of Communication with Friends and Family:   . Frequency of Social Gatherings with Friends and Family:   . Attends Religious Services:   . Active Member of Clubs or Organizations:   . Attends Archivist Meetings:   Marland Kitchen Marital Status:      Review of Systems  Review of Systems: A 12 point ROS discussed and pertinent positives are indicated in the HPI above.  All other systems are negative.  Physical Exam No direct physical exam was performed, telehealth visit only today because of Covid pandemic Vital Signs: There were no vitals taken for this visit.  Imaging: NM LIVER TUMOR LOC IMFLAM SPECT 1 DAY  Result Date: 06/20/2019 CLINICAL DATA:   Hepatocellular carcinoma. Subsequent treatment to the LEFT hepatic lobe. Prior yttrium 90 radio embolization to the RIGHT hepatic lobe (A999333 millicuries 0000000) and the LEFT hepatic lobe (Q000111Q millicuries on 99991111). Recurrence in LEFT hepatic lobe. EXAM: NUCLEAR MEDICINE SPECIAL MED RAD PHYSICS CONS; NUCLEAR MEDICINE RADIO PHARM THERAPY INTRA ARTERIAL; NUCLEAR MEDICINE TREATMENT PROCEDURE; NUCLEAR MEDICINE LIVER SCAN TECHNIQUE: In conjunction with the interventional radiologist a Y- Microsphere dose was calculated utilizing body surface area formulation. Calculated dose equal 22 mCi. Pre therapy MAA liver SPECT scan and CTA were evaluated. Utilizing a microcatheter system, the hepatic artery was selected and Y-90 microspheres were delivered in fractionated aliquots. Radiopharmaceutical was delivered by the interventional radiologist and nuclear radiologist. The patient tolerated procedure well. No adverse effects were noted. Bremsstrahlung planar and SPECT imaging of the abdomen following intrahepatic arterial delivery of Y-90 microsphere was performed. RADIOPHARMACEUTICALS:  [id number] millicuries yttrium 90 microspheres COMPARISON:  MRI 04/25/2019, MAA mapping 06/07/2019, prior Yttrium 90 therapy as as described in clinical data section FINDINGS: Y - 90 microspheres therapy as above. First therapy the right hepatic lobe. Bremsstrahlung planar and SPECT imaging of the abdomen following intrahepatic arterial delivery of Y-41microsphere demonstrates radioactivity localized to the LEFT hepatic lobe. No evidence of extrahepatic activity. IMPRESSION: Successful Y - 90 microsphere delivery for treatment of unresectable liver metastasis. Second therapy to the LEFT lobe. Prior treatment to the RIGHT hepatic lobe as described in clinical data. Bremssstrahlung scan demonstrates activity localized to LEFT hepatic lobe with no extrahepatic activity identified. Electronically Signed   By: Suzy Bouchard M.D.   On:  06/20/2019 14:18   IR Angiogram Visceral Selective  Result Date: 06/20/2019 INDICATION: Multifocal hepatocellular carcinoma, new left hepatic lesions by disease progression EXAM: Ultrasound guidance for vascular access Celiac, common hepatic, left hepatic, and 1 additional peripheral left hepatic branch catheterizations and angiograms Peripheral inferior left hepatic micro catheterization for peripheral left hepatic Y 90 radio embolization MEDICATIONS: 3.375 g Zosyn. The antibiotic was administered  within 1 hour of the procedure ANESTHESIA/SEDATION: Moderate (conscious) sedation was employed during this procedure. A total of Versed 2.0 mg and Fentanyl 100 mcg was administered intravenously. Moderate Sedation Time: 37 minutes. The patient's level of consciousness and vital signs were monitored continuously by radiology nursing throughout the procedure under my direct supervision. CONTRAST:  80 cc omni 300 FLUOROSCOPY TIME:  Fluoroscopy Time: 5 minutes 18 seconds (397 mGy). COMPLICATIONS: None immediate. PROCEDURE: Informed consent was obtained from the patient following explanation of the procedure, risks, benefits and alternatives. The patient understands, agrees and consents for the procedure. All questions were addressed. A time out was performed prior to the initiation of the procedure. Maximal barrier sterile technique utilized including caps, mask, sterile gowns, sterile gloves, large sterile drape, hand hygiene, and Betadine prep. Under sterile conditions and local anesthesia, ultrasound micropuncture access performed of the right common femoral artery. Images obtained for documentation of the patent right common femoral artery. Five French sheath inserted over a Bentson guidewire. C2 catheter utilized to select the celiac origin. Celiac angiogram performed. Celiac: The celiac origin including its branches all remain patent. Specifically the left gastric, splenic and hepatic vasculature all patent. Previous  coil embolization of the cystic and gastroduodenal arteries as before. C2 catheter advanced over a Glidewire into the common hepatic artery. Common hepatic angiogram performed. Common hepatic: The common, proper, left and right hepatic vasculature all remain patent as before. Renegade high flow microcatheter advanced into the left hepatic artery. Left hepatic angiogram performed. Left hepatic: The left hepatic vasculature remains patent. The inferior left hepatic branch again is the dominant supply to the areas of tumor vascular blushing. Over a double angled Glidewire, the Renegade high flow microcatheter was advanced into the peripheral inferior left hepatic artery. Additional angiography performed. Peripheral inferior left hepatic: The inferior left hepatic branch is the dominant supply to the areas of tumor blushing. Imaging is similar to 06/07/2019. Access is safe for Y 90 administration. Peripheral inferior left hepatic Y 90 embolization: Through the microcatheter, the entire dose was instilled under intermittent fluoroscopy slowly into the peripheral inferior left hepatic artery. Following embolization, inferior left hepatic artery remains patent with preserved antegrade flow. Catheter access removed. Hemostasis obtained with the ExoSeal device. No immediate complication. Patient tolerated the procedure. IMPRESSION: Successful peripheral inferior left hepatic Y 90 radio embolization as detailed above. (Second left hepatic Y 90 embolization) Electronically Signed   By: Jerilynn Mages.  Tadeo Besecker M.D.   On: 06/20/2019 12:07   IR Angiogram Selective Each Additional Vessel  Result Date: 06/20/2019 INDICATION: Multifocal hepatocellular carcinoma, new left hepatic lesions by disease progression EXAM: Ultrasound guidance for vascular access Celiac, common hepatic, left hepatic, and 1 additional peripheral left hepatic branch catheterizations and angiograms Peripheral inferior left hepatic micro catheterization for peripheral left  hepatic Y 90 radio embolization MEDICATIONS: 3.375 g Zosyn. The antibiotic was administered within 1 hour of the procedure ANESTHESIA/SEDATION: Moderate (conscious) sedation was employed during this procedure. A total of Versed 2.0 mg and Fentanyl 100 mcg was administered intravenously. Moderate Sedation Time: 37 minutes. The patient's level of consciousness and vital signs were monitored continuously by radiology nursing throughout the procedure under my direct supervision. CONTRAST:  80 cc omni 300 FLUOROSCOPY TIME:  Fluoroscopy Time: 5 minutes 18 seconds (397 mGy). COMPLICATIONS: None immediate. PROCEDURE: Informed consent was obtained from the patient following explanation of the procedure, risks, benefits and alternatives. The patient understands, agrees and consents for the procedure. All questions were addressed. A time out was performed prior  to the initiation of the procedure. Maximal barrier sterile technique utilized including caps, mask, sterile gowns, sterile gloves, large sterile drape, hand hygiene, and Betadine prep. Under sterile conditions and local anesthesia, ultrasound micropuncture access performed of the right common femoral artery. Images obtained for documentation of the patent right common femoral artery. Five French sheath inserted over a Bentson guidewire. C2 catheter utilized to select the celiac origin. Celiac angiogram performed. Celiac: The celiac origin including its branches all remain patent. Specifically the left gastric, splenic and hepatic vasculature all patent. Previous coil embolization of the cystic and gastroduodenal arteries as before. C2 catheter advanced over a Glidewire into the common hepatic artery. Common hepatic angiogram performed. Common hepatic: The common, proper, left and right hepatic vasculature all remain patent as before. Renegade high flow microcatheter advanced into the left hepatic artery. Left hepatic angiogram performed. Left hepatic: The left hepatic  vasculature remains patent. The inferior left hepatic branch again is the dominant supply to the areas of tumor vascular blushing. Over a double angled Glidewire, the Renegade high flow microcatheter was advanced into the peripheral inferior left hepatic artery. Additional angiography performed. Peripheral inferior left hepatic: The inferior left hepatic branch is the dominant supply to the areas of tumor blushing. Imaging is similar to 06/07/2019. Access is safe for Y 90 administration. Peripheral inferior left hepatic Y 90 embolization: Through the microcatheter, the entire dose was instilled under intermittent fluoroscopy slowly into the peripheral inferior left hepatic artery. Following embolization, inferior left hepatic artery remains patent with preserved antegrade flow. Catheter access removed. Hemostasis obtained with the ExoSeal device. No immediate complication. Patient tolerated the procedure. IMPRESSION: Successful peripheral inferior left hepatic Y 90 radio embolization as detailed above. (Second left hepatic Y 90 embolization) Electronically Signed   By: Jerilynn Mages.  Malaijah Houchen M.D.   On: 06/20/2019 12:07   IR Angiogram Selective Each Additional Vessel  Result Date: 06/20/2019 INDICATION: Multifocal hepatocellular carcinoma, new left hepatic lesions by disease progression EXAM: Ultrasound guidance for vascular access Celiac, common hepatic, left hepatic, and 1 additional peripheral left hepatic branch catheterizations and angiograms Peripheral inferior left hepatic micro catheterization for peripheral left hepatic Y 90 radio embolization MEDICATIONS: 3.375 g Zosyn. The antibiotic was administered within 1 hour of the procedure ANESTHESIA/SEDATION: Moderate (conscious) sedation was employed during this procedure. A total of Versed 2.0 mg and Fentanyl 100 mcg was administered intravenously. Moderate Sedation Time: 37 minutes. The patient's level of consciousness and vital signs were monitored continuously by  radiology nursing throughout the procedure under my direct supervision. CONTRAST:  80 cc omni 300 FLUOROSCOPY TIME:  Fluoroscopy Time: 5 minutes 18 seconds (397 mGy). COMPLICATIONS: None immediate. PROCEDURE: Informed consent was obtained from the patient following explanation of the procedure, risks, benefits and alternatives. The patient understands, agrees and consents for the procedure. All questions were addressed. A time out was performed prior to the initiation of the procedure. Maximal barrier sterile technique utilized including caps, mask, sterile gowns, sterile gloves, large sterile drape, hand hygiene, and Betadine prep. Under sterile conditions and local anesthesia, ultrasound micropuncture access performed of the right common femoral artery. Images obtained for documentation of the patent right common femoral artery. Five French sheath inserted over a Bentson guidewire. C2 catheter utilized to select the celiac origin. Celiac angiogram performed. Celiac: The celiac origin including its branches all remain patent. Specifically the left gastric, splenic and hepatic vasculature all patent. Previous coil embolization of the cystic and gastroduodenal arteries as before. C2 catheter advanced over a Glidewire into  the common hepatic artery. Common hepatic angiogram performed. Common hepatic: The common, proper, left and right hepatic vasculature all remain patent as before. Renegade high flow microcatheter advanced into the left hepatic artery. Left hepatic angiogram performed. Left hepatic: The left hepatic vasculature remains patent. The inferior left hepatic branch again is the dominant supply to the areas of tumor vascular blushing. Over a double angled Glidewire, the Renegade high flow microcatheter was advanced into the peripheral inferior left hepatic artery. Additional angiography performed. Peripheral inferior left hepatic: The inferior left hepatic branch is the dominant supply to the areas of tumor  blushing. Imaging is similar to 06/07/2019. Access is safe for Y 90 administration. Peripheral inferior left hepatic Y 90 embolization: Through the microcatheter, the entire dose was instilled under intermittent fluoroscopy slowly into the peripheral inferior left hepatic artery. Following embolization, inferior left hepatic artery remains patent with preserved antegrade flow. Catheter access removed. Hemostasis obtained with the ExoSeal device. No immediate complication. Patient tolerated the procedure. IMPRESSION: Successful peripheral inferior left hepatic Y 90 radio embolization as detailed above. (Second left hepatic Y 90 embolization) Electronically Signed   By: Jerilynn Mages.  Vertis Bauder M.D.   On: 06/20/2019 12:07   NM Special Med Rad Physics Cons  Result Date: 06/20/2019 CLINICAL DATA:  Hepatocellular carcinoma. Subsequent treatment to the LEFT hepatic lobe. Prior yttrium 90 radio embolization to the RIGHT hepatic lobe (A999333 millicuries 0000000) and the LEFT hepatic lobe (Q000111Q millicuries on 99991111). Recurrence in LEFT hepatic lobe. EXAM: NUCLEAR MEDICINE SPECIAL MED RAD PHYSICS CONS; NUCLEAR MEDICINE RADIO PHARM THERAPY INTRA ARTERIAL; NUCLEAR MEDICINE TREATMENT PROCEDURE; NUCLEAR MEDICINE LIVER SCAN TECHNIQUE: In conjunction with the interventional radiologist a Y- Microsphere dose was calculated utilizing body surface area formulation. Calculated dose equal 22 mCi. Pre therapy MAA liver SPECT scan and CTA were evaluated. Utilizing a microcatheter system, the hepatic artery was selected and Y-90 microspheres were delivered in fractionated aliquots. Radiopharmaceutical was delivered by the interventional radiologist and nuclear radiologist. The patient tolerated procedure well. No adverse effects were noted. Bremsstrahlung planar and SPECT imaging of the abdomen following intrahepatic arterial delivery of Y-90 microsphere was performed. RADIOPHARMACEUTICALS:  [id number] millicuries yttrium 90 microspheres COMPARISON:   MRI 04/25/2019, MAA mapping 06/07/2019, prior Yttrium 90 therapy as as described in clinical data section FINDINGS: Y - 90 microspheres therapy as above. First therapy the right hepatic lobe. Bremsstrahlung planar and SPECT imaging of the abdomen following intrahepatic arterial delivery of Y-59microsphere demonstrates radioactivity localized to the LEFT hepatic lobe. No evidence of extrahepatic activity. IMPRESSION: Successful Y - 90 microsphere delivery for treatment of unresectable liver metastasis. Second therapy to the LEFT lobe. Prior treatment to the RIGHT hepatic lobe as described in clinical data. Bremssstrahlung scan demonstrates activity localized to LEFT hepatic lobe with no extrahepatic activity identified. Electronically Signed   By: Suzy Bouchard M.D.   On: 06/20/2019 14:18   NM Special Treatment Procedure  Result Date: 06/20/2019 CLINICAL DATA:  Hepatocellular carcinoma. Subsequent treatment to the LEFT hepatic lobe. Prior yttrium 90 radio embolization to the RIGHT hepatic lobe (A999333 millicuries 0000000) and the LEFT hepatic lobe (Q000111Q millicuries on 99991111). Recurrence in LEFT hepatic lobe. EXAM: NUCLEAR MEDICINE SPECIAL MED RAD PHYSICS CONS; NUCLEAR MEDICINE RADIO PHARM THERAPY INTRA ARTERIAL; NUCLEAR MEDICINE TREATMENT PROCEDURE; NUCLEAR MEDICINE LIVER SCAN TECHNIQUE: In conjunction with the interventional radiologist a Y- Microsphere dose was calculated utilizing body surface area formulation. Calculated dose equal 22 mCi. Pre therapy MAA liver SPECT scan and CTA were evaluated. Utilizing a microcatheter system, the  hepatic artery was selected and Y-90 microspheres were delivered in fractionated aliquots. Radiopharmaceutical was delivered by the interventional radiologist and nuclear radiologist. The patient tolerated procedure well. No adverse effects were noted. Bremsstrahlung planar and SPECT imaging of the abdomen following intrahepatic arterial delivery of Y-90 microsphere was  performed. RADIOPHARMACEUTICALS:  [id number] millicuries yttrium 90 microspheres COMPARISON:  MRI 04/25/2019, MAA mapping 06/07/2019, prior Yttrium 90 therapy as as described in clinical data section FINDINGS: Y - 90 microspheres therapy as above. First therapy the right hepatic lobe. Bremsstrahlung planar and SPECT imaging of the abdomen following intrahepatic arterial delivery of Y-45microsphere demonstrates radioactivity localized to the LEFT hepatic lobe. No evidence of extrahepatic activity. IMPRESSION: Successful Y - 90 microsphere delivery for treatment of unresectable liver metastasis. Second therapy to the LEFT lobe. Prior treatment to the RIGHT hepatic lobe as described in clinical data. Bremssstrahlung scan demonstrates activity localized to LEFT hepatic lobe with no extrahepatic activity identified. Electronically Signed   By: Suzy Bouchard M.D.   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 The antibiotic was administered within 1 hour of the procedure ANESTHESIA/SEDATION: Moderate (conscious) sedation was employed during this procedure. A total of Versed 2.0 mg and Fentanyl 100 mcg was administered intravenously. Moderate Sedation Time: 37 minutes. The patient's level of consciousness and vital signs were monitored continuously by radiology nursing throughout the procedure under my direct supervision. CONTRAST:  80 cc omni 300 FLUOROSCOPY TIME:  Fluoroscopy Time: 5 minutes 18 seconds (397 mGy). COMPLICATIONS: None immediate. PROCEDURE: Informed consent was obtained from the patient  following explanation of the procedure, risks, benefits and alternatives. The patient understands, agrees and consents for the procedure. All questions were addressed. A time out was performed prior to the initiation of the procedure. Maximal barrier sterile technique utilized including caps, mask, sterile gowns, sterile gloves, large sterile drape, hand hygiene, and Betadine prep. Under sterile conditions and local anesthesia, ultrasound micropuncture access performed of the right common femoral artery. Images obtained for documentation of the patent right common femoral artery. Five French sheath inserted over a Bentson guidewire. C2 catheter utilized to select the celiac origin. Celiac angiogram performed. Celiac: The celiac origin including its branches all remain patent. Specifically the left gastric, splenic and hepatic vasculature all patent. Previous coil embolization of the cystic and gastroduodenal arteries as before. C2 catheter advanced over a Glidewire into the common hepatic artery. Common hepatic angiogram performed. Common hepatic: The common, proper, left and right hepatic vasculature all remain patent as before. Renegade high flow microcatheter advanced into the left hepatic artery. Left hepatic angiogram performed. Left hepatic: The left hepatic vasculature remains patent. The inferior left hepatic branch again is the dominant supply to the areas of tumor vascular blushing. Over a double angled Glidewire, the Renegade high flow microcatheter was advanced into the peripheral inferior left hepatic artery. Additional angiography performed. Peripheral inferior left hepatic: The inferior left hepatic branch is the dominant supply to the areas of tumor blushing. Imaging is similar to 06/07/2019. Access is safe for Y 90 administration. Peripheral inferior left hepatic Y 90 embolization: Through the microcatheter, the entire dose was instilled under intermittent fluoroscopy slowly into the peripheral  inferior left hepatic artery. Following embolization, inferior left hepatic artery remains patent with preserved antegrade flow. Catheter access removed. Hemostasis obtained with the ExoSeal device. No immediate complication. Patient tolerated the procedure. IMPRESSION: Successful peripheral inferior left hepatic Y 90 radio embolization as detailed above. (Second left hepatic Y 90 embolization) Electronically Signed   By: Jerilynn Mages.  Amun Stemm M.D.   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 The antibiotic was administered within 1 hour of the procedure ANESTHESIA/SEDATION: Moderate (conscious) sedation was employed during this procedure. A total of Versed 2.0 mg and Fentanyl 100 mcg was administered intravenously. Moderate Sedation Time: 37 minutes. The patient's level of consciousness and vital signs were monitored continuously by radiology nursing throughout the procedure under my direct supervision. CONTRAST:  80 cc omni 300 FLUOROSCOPY TIME:  Fluoroscopy Time: 5 minutes 18 seconds (397 mGy). COMPLICATIONS: None immediate. PROCEDURE: Informed consent was obtained from the patient following explanation of the procedure, risks, benefits and alternatives. The patient understands, agrees and consents for the procedure. All questions were addressed. A time out was performed prior to the initiation of the procedure. Maximal barrier sterile technique utilized including caps, mask, sterile gowns, sterile gloves, large sterile drape, hand hygiene, and Betadine prep. Under  sterile conditions and local anesthesia, ultrasound micropuncture access performed of the right common femoral artery. Images obtained for documentation of the patent right common femoral artery. Five French sheath inserted over a Bentson guidewire. C2 catheter utilized to select the celiac origin. Celiac angiogram performed. Celiac: The celiac origin including its branches all remain patent. Specifically the left gastric, splenic and hepatic vasculature all patent. Previous coil embolization of the cystic and gastroduodenal arteries as before. C2 catheter advanced over a Glidewire into the common hepatic artery. Common hepatic angiogram performed. Common hepatic: The common, proper, left and right hepatic vasculature all remain patent as before. Renegade high flow microcatheter advanced into the left hepatic artery. Left hepatic angiogram performed. Left hepatic: The left hepatic vasculature remains patent. The inferior left hepatic branch again is the dominant supply to the areas of tumor vascular blushing. Over a double angled Glidewire, the Renegade high flow microcatheter was advanced into the peripheral inferior left hepatic artery. Additional angiography performed. Peripheral inferior left hepatic: The inferior left hepatic branch is the dominant supply to the areas of tumor blushing. Imaging is similar to 06/07/2019. Access is safe for Y 90 administration. Peripheral inferior left hepatic Y 90 embolization: Through the microcatheter, the entire dose was instilled under intermittent fluoroscopy slowly into the peripheral inferior left hepatic artery. Following embolization, inferior left hepatic artery remains patent with preserved antegrade flow. Catheter access removed. Hemostasis obtained with the ExoSeal device. No immediate complication. Patient tolerated the procedure. IMPRESSION: Successful peripheral inferior left hepatic Y 90 radio embolization as detailed above. (Second left hepatic Y 90  embolization) Electronically Signed   By: Jerilynn Mages.  Bernyce Brimley M.D.   On: 06/20/2019 12:07   NM Radio Pharm Therapy Intraarterial  Result Date: 06/20/2019 CLINICAL DATA:  Hepatocellular carcinoma. Subsequent treatment to the LEFT hepatic lobe. Prior yttrium 90 radio embolization to the RIGHT hepatic lobe (A999333 millicuries 0000000) and the LEFT hepatic lobe (16.2  millicuries on 99991111). Recurrence in LEFT hepatic lobe. EXAM: NUCLEAR MEDICINE SPECIAL MED RAD PHYSICS CONS; NUCLEAR MEDICINE RADIO PHARM THERAPY INTRA ARTERIAL; NUCLEAR MEDICINE TREATMENT PROCEDURE; NUCLEAR MEDICINE LIVER SCAN TECHNIQUE: In conjunction with the interventional radiologist a Y- Microsphere dose was calculated utilizing body surface area formulation. Calculated dose equal 22 mCi. Pre therapy MAA liver SPECT scan and CTA were evaluated. Utilizing a microcatheter system, the hepatic artery was selected and Y-90 microspheres were delivered in fractionated aliquots. Radiopharmaceutical was delivered by the interventional radiologist and nuclear radiologist. The patient tolerated procedure well. No adverse effects were noted. Bremsstrahlung planar and SPECT imaging of the abdomen following intrahepatic arterial delivery of Y-90 microsphere was performed. RADIOPHARMACEUTICALS:  [id number] millicuries yttrium 90 microspheres COMPARISON:  MRI 04/25/2019, MAA mapping 06/07/2019, prior Yttrium 90 therapy as as described in clinical data section FINDINGS: Y - 90 microspheres therapy as above. First therapy the right hepatic lobe. Bremsstrahlung planar and SPECT imaging of the abdomen following intrahepatic arterial delivery of Y-26microsphere demonstrates radioactivity localized to the LEFT hepatic lobe. No evidence of extrahepatic activity. IMPRESSION: Successful Y - 90 microsphere delivery for treatment of unresectable liver metastasis. Second therapy to the LEFT lobe. Prior treatment to the RIGHT hepatic lobe as described in clinical data. Bremssstrahlung  scan demonstrates activity localized to LEFT hepatic lobe with no extrahepatic activity identified. Electronically Signed   By: Suzy Bouchard M.D.   On: 06/20/2019 14:18    Labs:  CBC: Recent Labs    03/24/19 1011 05/04/19 1038 06/07/19 0810 06/20/19 0843  WBC 4.3 4.7 3.7* 3.6*  HGB 13.5 12.9* 13.7 12.7*  HCT 37.7* 35.9* 38.6* 36.1*  PLT 65* 58* 65* PLATELET CLUMPS NOTED ON SMEAR, UNABLE TO ESTIMATE    COAGS: Recent Labs    06/07/19 0810 06/20/19 0843  INR 1.4* 1.6*    BMP: Recent Labs    03/24/19 1011 05/04/19 1038 06/07/19 0810 06/20/19 0925  NA 143 137 140 139  K 3.8 3.4* 3.5 4.5  CL 111 106 106 107  CO2 24 24 21* 24  GLUCOSE 113* 156* 99 99  BUN <4* <4* <5* <5*  CALCIUM 8.2* 7.9* 8.6* 7.9*  CREATININE 0.80 0.79 0.73 0.68  GFRNONAA >60 >60 >60 >60  GFRAA >60 >60 >60 >60    LIVER FUNCTION TESTS: Recent Labs    03/24/19 1011 05/04/19 1038 06/07/19 0810 06/20/19 0925  BILITOT 1.3* 1.5* 2.4* 1.8*  AST 52* 60* 60* 48*  ALT 20 23 22 18   ALKPHOS 146* 134* 143* 116  PROT 7.5 7.4 8.2* 6.9  ALBUMIN 2.7* 2.4* 2.9* 2.3*    TUMOR MARKERS: No results for input(s): AFPTM, CEA, CA199, CHROMGRNA in the last 8760 hours.  Assessment and Plan:  Multifocal hepatocellular carcinoma status post a repeat left hepatic Y 90 radioembolization approximate 1 month ago.  He has had a total of 3  Y 90 embolizations and 1 right bland embolization.  No interval surveillance imaging at this point.  No known extrahepatic disease.  Stable functional status.  Most recent bilirubin 1.6.  Plan: Repeat surveillance MRI in 3 months  For his recent symptoms of nausea, vomiting, decreased p.o. intake and dehydration.  He wishes to seek emergency department evaluation at some point today when his wife is able to bring him in.  Is concerned he may have contracted the COVID-19 virus.   Electronically Signed: Greggory Keen 07/12/2019, 11:07 AM   I spent a total of    25 Minutes in  remote  clinical consultation,  greater than 50% of which was counseling/coordinating care for this patient with multifocal HCC.    Visit type: Audio only (telephone). Audio (no video) only due to patient's lack of internet/smartphone capability. Alternative for in-person consultation at [facility], [address], [place], [place]. This visit type was conducted due to national recommendations for restrictions regarding the COVID-19 Pandemic (e.g. social distancing).  This format is felt to be most appropriate for this patient at this time.  All issues noted in this document were discussed and addressed.

## 2019-07-14 ENCOUNTER — Other Ambulatory Visit: Payer: Self-pay

## 2019-07-14 ENCOUNTER — Encounter: Payer: Self-pay | Admitting: Family Medicine

## 2019-07-14 ENCOUNTER — Ambulatory Visit (INDEPENDENT_AMBULATORY_CARE_PROVIDER_SITE_OTHER): Payer: Medicare HMO | Admitting: Family Medicine

## 2019-07-14 VITALS — BP 100/58 | HR 94 | Temp 97.5°F | Wt 135.6 lb

## 2019-07-14 DIAGNOSIS — R630 Anorexia: Secondary | ICD-10-CM

## 2019-07-14 DIAGNOSIS — C220 Liver cell carcinoma: Secondary | ICD-10-CM

## 2019-07-14 DIAGNOSIS — K219 Gastro-esophageal reflux disease without esophagitis: Secondary | ICD-10-CM | POA: Diagnosis not present

## 2019-07-14 DIAGNOSIS — R1111 Vomiting without nausea: Secondary | ICD-10-CM

## 2019-07-14 DIAGNOSIS — R142 Eructation: Secondary | ICD-10-CM | POA: Diagnosis not present

## 2019-07-14 MED ORDER — DEXILANT 60 MG PO CPDR: 60.0000 mg | DELAYED_RELEASE_CAPSULE | Freq: Every day | ORAL | 0 refills | Status: AC

## 2019-07-14 NOTE — Progress Notes (Signed)
   Subjective:    Patient ID: Zachary Steele, male    DOB: 01/17/1951, 69 y.o.   MRN: [id number]  HPI Chief Complaint  Patient presents with  . ER follow-up    ER follow-up was dehyrated.    He is here with complaints of not being able to keep food down. States everything he eats comes back up.  States he has constant "bubbling" in his stomach.   He went to the ED for dedhyration and weight loss. He still has generalized weakness.   Denies nausea but states he has a poor appetite.   He is not eating solid foods because he is fearful so he has started drinking  more calories.   Denies fever, chills, dizziness, chest pain, palpitations, diarrhea or constipation.   Reviewed allergies, medications, past medical, surgical, family, and social history.   Review of Systems Pertinent positives and negatives in the history of present illness.     Objective:   Physical Exam BP (!) 100/58   Pulse 94   Temp (!) 97.5 F (36.4 C)   Wt 135 lb 9.6 oz (61.5 kg)   SpO2 97%   BMI 21.24 kg/m         Assessment & Plan:  Non-intractable vomiting without nausea, unspecified vomiting type - Plan: Ambulatory referral to Gastroenterology  Poor appetite - Plan: Ambulatory referral to Gastroenterology  Hepatocellular carcinoma [facility]) - Plan: Ambulatory referral to Gastroenterology  Belching symptom - Plan: Ambulatory referral to Gastroenterology  Gastroesophageal reflux disease, unspecified whether esophagitis present - Plan: Ambulatory referral to Gastroenterology  Reviewed notes and labs from his ED visit.  Weight seems to have stabilized since his visit to the ED.  He is not in any acute distress.  Glucerna samples given  Dexilant samples given.  Urgent referral back to his GI, Dr. Ardis Hughs. I also encouraged him to follow up with his oncologist.

## 2019-07-14 NOTE — Patient Instructions (Signed)
Take the Dexilant once daily.  Continue getting plenty of fluids and drinking protein shakes or smoothies.  I am referring you back to your gastroenterologist.  They should call you to schedule  Please discuss your appetite concerns with your oncologist at your follow-up appointment later this month.

## 2019-07-20 ENCOUNTER — Other Ambulatory Visit: Payer: Self-pay

## 2019-07-20 NOTE — Patient Outreach (Signed)
[facility]) Care Management  07/20/2019  HOMERO STANBRO 10-Apr-1951 [id number]    Telephone Assessment   Outreach attempt to patient. No answer at present.    Plan: RN CM will make outreach attempt to patient within the month of June.    Enzo Montgomery, RN,BSN,CCM [facility] Telephonic Care Management Coordinator Direct Phone: [phone] Toll Free: [phone] Fax: [phone]

## 2019-07-25 ENCOUNTER — Ambulatory Visit: Payer: Self-pay

## 2019-07-29 NOTE — Progress Notes (Signed)
[place]   Telephone:([phone] Fax:(336) [phone]   Clinic Follow up Note   Patient Care Team: [name], NP-C as PCP - General (Family Medicine) Milus Banister, MD as Attending Physician (Gastroenterology) Ilean China as Physician Assistant (Cardiology) Prescott Gum, Collier Salina, MD as Consulting Physician (Cardiothoracic Surgery) Greggory Keen, MD as Consulting Physician (Interventional Radiology) Tania Ade, Tomasa Blase, RN as [place] Management  Date of Service:  08/03/2019  CHIEF COMPLAINT: f/u [place]  SUMMARY OF ONCOLOGIC HISTORY: Oncology History Overview Note  Cancer Staging Hepatocellular carcinoma [facility]) Staging form: Liver, AJCC 8th Edition - Clinical stage from 07/25/2017: Stage IIIA (cT3, cN0, cM0) - Signed by Truitt Merle, MD on 08/26/2017     Hepatocellular carcinoma ([place])  07/2017 Tumor Marker   AFP: 2431   07/15/2017 Imaging   IMPRESSION: ULTRASOUND ABDOMEN: Multiple hypoechoic liver masses, suspicious for multifocal hepatoma or hepatic metastatic disease. Abdomen MRI without and with contrast is recommended for further characterization. Cholelithiasis. No sonographic signs of acute cholecystitis or biliary dilatation. These results will be called to the ordering clinician or representative by the Radiologist Assistant, and communication documented in the PACS or zVision Dashboard.  ULTRASOUND HEPATIC ELASTOGRAPHY: Median hepatic shear wave velocity is calculated at 2.52 m/sec. Corresponding Metavir fibrosis score is Some F3 + F4. Risk of fibrosis is High. Follow-up: Follow up advised   07/25/2017 Imaging   IMPRESSION: At least 4 separate hepatic masses with characteristics of multifocal hepatoma, largest measuring 5.5 cm. No evidence of abdominal metastatic disease. Cholelithiasis.  No radiographic evidence of cholecystitis. Incidentally noted congenital small bowel malrotation. No evidence of volvulus  or bowel obstruction.    07/25/2017 Cancer Staging   Staging form: Liver, AJCC 8th Edition - Clinical stage from 07/25/2017: Stage IIIA (cT3, cN0, cM0) - Signed by Truitt Merle, MD on 08/26/2017   08/25/2017 Initial Diagnosis   Hepatocellular carcinoma ([place])   11/06/2017 Procedure   peripheral right hepatic artery Y 90 radio embolization   12/04/2017 Procedure   peripheral left hepatic Y 90 radio embolization   12/21/2017 Imaging   CT Chest W Contrast 12/21/17  IMPRESSION: 1. 4.8 x 5.9 x 6.7 cm heterogeneously enhancing hepatic mass compatible with the reported clinical history of hepatocellular carcinoma. No definite metastatic disease noted in the thorax, although there is a slightly prominent anterior mediastinal lymph node measuring 7 mm in short axis in the juxta pericardiac nodal station. This is nonspecific but warrants attention on follow-up studies. 2. Mild diffuse bronchial wall thickening with mild centrilobular and moderate paraseptal emphysema; imaging findings suggestive of underlying COPD. 3. In addition, there is evidence of probable interstitial lung disease in the lung bases. Outpatient referral to Pulmonology for further evaluation could be considered if clinically appropriate. 4. Aortic atherosclerosis, in addition to left main and 3 vessel coronary artery disease. Status post median sternotomy for CABG including LIMA to the LAD. Aortic Atherosclerosis (ICD10-I70.0) and Emphysema (ICD10-J43.9).   04/20/2018 Imaging   MRI Abdomen  IMPRESSION: 1. Mixed response to therapy. Dominant segment 4A left liver lobe tumor is decreased in size but demonstrates persistent thick peripheral enhancement indicating viable tumor. Inferior right liver lobe 3.0 cm mass is increased in size and demonstrates persistent avid arterial enhancement indicating viable tumor. Two additional smaller liver masses have decreased in size. One new subcentimeter segment 3 left liver lobe mass  suspicious for new site of hepatocellular carcinoma. 2. Hepatic cirrhosis.  Normal size spleen.  No ascites. 3. No abdominal lymphadenopathy.  4. Cholelithiasis.   05/06/2018 Procedure   05/06/18 right hepatic bland embolization by Dr. Annamaria Boots    11/04/2018 Imaging   MRI Abdomen 11/04/18  IMPRESSION: 1. Stable cirrhotic changes involving the liver along with fairly diffuse fatty infiltration. 2. Interval decrease in size of the segment 4B liver lesion when compared to prior studies. There is a persistent rim of enhancement around the lesion but I do not see any nodularity or mass effect to suggest this is tumor. It certainly could be enhancing granulation tissue or inflammatory change. Recommend continued surveillance. 3. Slight interval enlargement of two segment III early arterial phase enhancing nodules which could be dysplastic nodules or small HCC's. A third nodule in segment 4B is stable at 7 mm. 4. Cholelithiasis without sonographic findings for acute cholecystitis. Normal common bile duct. 5. No abdominal lymphadenopathy.   06/20/2019 Procedure   Y90 embolization on 06/20/19 with IR Dr Annamaria Boots    07/12/2019 Imaging   CT CAP W Contrast IMPRESSION: 1. No CT evidence for acute intrathoracic abnormality.  Emphysema. 2. Grossly abnormal appearing liver with heterogenous fatty infiltration/underlying liver disease and multiple liver masses as noted on MRI from 2021 and corresponding to history of hepatocellular carcinoma. 3. Gallstones 4. Diffuse diverticular disease of the colon without convincing evidence for acute inflammatory process    Chemotherapy   Tecentriq and avastin q3weeks starting next week. First cycle with Tecentriq alone.        CURRENT THERAPY:  Tecentriq and avastin q3weeks starting next week. First cycle with Tecentriq alone.   INTERVAL HISTORY:  MARLIN Steele is here for a follow up of liver cancer. He presents to the clinic with his family member. He  notes his major issue lately is not eating. He notes he recently had painful cough which has resolved now. He went to [facility] to see Dr Ardis Hughs and noted no obvious cause. He noted taste change has really effected his appetite. He denies having had COVID19. He notes this week he went for a walk. He notes occasional episodes of dizziness and near syncope that lasts for a few minutes and then will go away. He notes he has been taking multiple vitamin supplements. He notes he was on Ensure but now on Glucerna. He notes he does not get good sleep due to first shift working for years. He notes he occasionally depressed.     REVIEW OF SYSTEMS:   Constitutional: Denies fevers, chills (+) Low appetite, taste change, weight loss (+) trouble sleeping Eyes: Denies blurriness of vision Ears, nose, mouth, throat, and face: Denies mucositis or sore throat (+) Occasional dizziness Respiratory: Denies cough, dyspnea or wheezes Cardiovascular: Denies palpitation, chest discomfort or lower extremity swelling Gastrointestinal:  Denies nausea, heartburn or change in bowel habits Skin: Denies abnormal skin rashes Lymphatics: Denies new lymphadenopathy or easy bruising Neurological:Denies numbness, tingling or new weaknesses Behavioral/Psych: Mood is stable, no new changes   All other systems were reviewed with the patient and are negative.  MEDICAL HISTORY:  Past Medical History:  Diagnosis Date  . Alcohol dependence ([place])   . Anemia    denies  . Arthritis   . Cataract   . Cirrhosis ([place])   . Coronary artery disease due to lipid rich plaque   . Depression   . Dyspnea   . Hepatitis C    Has A and B also  . PAD (peripheral artery disease) ([place])    pt reports he has this and has chronic leg "soreness"  .  Peripheral vascular disease ([place])    essentially normal circulations with two-vessel  runoff to the feet bilaterally  . Tobacco abuse     SURGICAL HISTORY: Past Surgical History:  Procedure  Laterality Date  . CARDIAC CATHETERIZATION    . COLONOSCOPY    . CORONARY ARTERY BYPASS GRAFT  2010   Triple bypass at [facility]  . DOPPLER ECHOCARDIOGRAPHY  2011  . IR ANGIOGRAM SELECTIVE EACH ADDITIONAL VESSEL  10/27/2017  . IR ANGIOGRAM SELECTIVE EACH ADDITIONAL VESSEL  10/27/2017  . IR ANGIOGRAM SELECTIVE EACH ADDITIONAL VESSEL  10/27/2017  . IR ANGIOGRAM SELECTIVE EACH ADDITIONAL VESSEL  10/27/2017  . IR ANGIOGRAM SELECTIVE EACH ADDITIONAL VESSEL  10/27/2017  . IR ANGIOGRAM SELECTIVE EACH ADDITIONAL VESSEL  10/27/2017  . IR ANGIOGRAM SELECTIVE EACH ADDITIONAL VESSEL  10/27/2017  . IR ANGIOGRAM SELECTIVE EACH ADDITIONAL VESSEL  11/06/2017  . IR ANGIOGRAM SELECTIVE EACH ADDITIONAL VESSEL  11/06/2017  . IR ANGIOGRAM SELECTIVE EACH ADDITIONAL VESSEL  11/06/2017  . IR ANGIOGRAM SELECTIVE EACH ADDITIONAL VESSEL  12/04/2017  . IR ANGIOGRAM SELECTIVE EACH ADDITIONAL VESSEL  12/04/2017  . IR ANGIOGRAM SELECTIVE EACH ADDITIONAL VESSEL  05/06/2018  . IR ANGIOGRAM SELECTIVE EACH ADDITIONAL VESSEL  05/06/2018  . IR ANGIOGRAM SELECTIVE EACH ADDITIONAL VESSEL  05/06/2018  . IR ANGIOGRAM SELECTIVE EACH ADDITIONAL VESSEL  05/06/2018  . IR ANGIOGRAM SELECTIVE EACH ADDITIONAL VESSEL  06/07/2019  . IR ANGIOGRAM SELECTIVE EACH ADDITIONAL VESSEL  06/07/2019  . IR ANGIOGRAM SELECTIVE EACH ADDITIONAL VESSEL  06/07/2019  . IR ANGIOGRAM SELECTIVE EACH ADDITIONAL VESSEL  06/20/2019  . IR ANGIOGRAM SELECTIVE EACH ADDITIONAL VESSEL  06/20/2019  . IR ANGIOGRAM VISCERAL SELECTIVE  10/27/2017  . IR ANGIOGRAM VISCERAL SELECTIVE  11/06/2017  . IR ANGIOGRAM VISCERAL SELECTIVE  12/04/2017  . IR ANGIOGRAM VISCERAL SELECTIVE  05/06/2018  . IR ANGIOGRAM VISCERAL SELECTIVE  06/07/2019  . IR ANGIOGRAM VISCERAL SELECTIVE  06/20/2019  . IR EMBO ARTERIAL NOT HEMORR HEMANG INC GUIDE ROADMAPPING  10/27/2017  . IR EMBO TUMOR ORGAN ISCHEMIA INFARCT INC GUIDE ROADMAPPING  11/06/2017  . IR EMBO TUMOR ORGAN ISCHEMIA INFARCT INC GUIDE ROADMAPPING  12/04/2017  . IR  EMBO TUMOR ORGAN ISCHEMIA INFARCT INC GUIDE ROADMAPPING  05/06/2018  . IR EMBO TUMOR ORGAN ISCHEMIA INFARCT INC GUIDE ROADMAPPING  06/20/2019  . IR RADIOLOGIST EVAL & MGMT  08/12/2017  . IR RADIOLOGIST EVAL & MGMT  09/22/2017  . IR RADIOLOGIST EVAL & MGMT  01/07/2018  . IR RADIOLOGIST EVAL & MGMT  04/20/2018  . IR RADIOLOGIST EVAL & MGMT  11/18/2018  . IR RADIOLOGIST EVAL & MGMT  05/12/2019  . IR RADIOLOGIST EVAL & MGMT  07/12/2019  . IR US GUIDE VASC ACCESS RIGHT  10/27/2017  . IR US GUIDE VASC ACCESS RIGHT  11/06/2017  . IR US GUIDE VASC ACCESS RIGHT  12/04/2017  . IR US GUIDE VASC ACCESS RIGHT  05/06/2018  . IR US GUIDE VASC ACCESS RIGHT  06/07/2019  . IR US GUIDE VASC ACCESS RIGHT  06/20/2019  . NM MYOVIEW LTD  2012    I have reviewed the social history and family history with the patient and they are unchanged from previous note.  ALLERGIES:  is allergic to statins.  MEDICATIONS:  Current Outpatient Medications  Medication Sig Dispense Refill  . albuterol (PROVENTIL HFA;VENTOLIN HFA) 108 (90 Base) MCG/ACT inhaler Inhale 2 puffs into the lungs every 6 (six) hours as needed for wheezing or shortness of breath. 1 Inhaler 6  . aspirin 81 MG tablet Take  81 mg by mouth daily.    . Cholecalciferol (VITAMIN D3 PO) Take 125 mcg by mouth daily.    Zachary Kitchen dexlansoprazole (DEXILANT) 60 MG capsule Take 1 capsule (60 mg total) by mouth daily. 15 capsule 0  . LORazepam (ATIVAN) 0.5 MG tablet Take 1 tablet (0.5 mg) approximately 30 minutes prior to MRI. If still anxious, may take additional 1 tablet (0.5 mg). (Patient not taking: Reported on 08/01/2019) 2 tablet 0  . mirtazapine (REMERON) 7.5 MG tablet Take 1 tablet (7.5 mg total) by mouth at bedtime. 30 tablet 2  . mometasone-formoterol (DULERA) 200-5 MCG/ACT AERO Inhale 2 puffs into the lungs 2 (two) times daily.    . Multiple Vitamin (MULTIVITAMIN WITH MINERALS) TABS tablet Take 1 tablet by mouth daily.    . pantoprazole (PROTONIX) 40 MG tablet 40 mg one tab 30  minutes before breakfast meal 90 tablet 3  . tiotropium (SPIRIVA) 18 MCG inhalation capsule Place 1 capsule (18 mcg total) into inhaler and inhale daily. 30 capsule 6  . Turmeric (QC TUMERIC COMPLEX) 500 MG CAPS Take by mouth daily.    Zachary Kitchen zinc gluconate 50 MG tablet Take 50 mg by mouth daily.     No current facility-administered medications for this visit.    PHYSICAL EXAMINATION: ECOG PERFORMANCE STATUS: 2 - Symptomatic, <50% confined to bed  Vitals:   08/03/19 1317  BP: (!) 112/100  Pulse: (!) 109  Resp: 18  Temp: 98.5 F (36.9 C)  SpO2: 100%   Filed Weights   08/03/19 1317  Weight: 134 lb 6.4 oz (61 kg)    GENERAL:alert, no distress and comfortable SKIN: skin color, texture, turgor are normal, no rashes or significant lesions EYES: normal, Conjunctiva are pink and non-injected, sclera clear  NECK: supple, thyroid normal size, non-tender, without nodularity LYMPH:  no palpable lymphadenopathy in the cervical, axillary  LUNGS: clear to auscultation and percussion with normal breathing effort HEART: regular rate & rhythm and no murmurs (+) lower extremity edema ABDOMEN:abdomen soft, non-tender and normal bowel sounds (+) Epigastric tenderness  Musculoskeletal:no cyanosis of digits and no clubbing  NEURO: alert & oriented x 3 with fluent speech, no focal motor/sensory deficits  LABORATORY DATA:  I have reviewed the data as listed CBC Latest Ref Rng & Units 08/03/2019 07/12/2019 06/20/2019  WBC 4.0 - 10.5 K/uL 4.2 4.0 3.6(L)  Hemoglobin 13.0 - 17.0 g/dL 12.8(L) 13.3 12.7(L)  Hematocrit 39.0 - 52.0 % 36.5(L) 35.9(L) 36.1(L)  Platelets 150 - 400 K/uL 53(L) 60(L) PLATELET CLUMPS NOTED ON SMEAR, UNABLE TO ESTIMATE     CMP Latest Ref Rng & Units 08/03/2019 07/12/2019 06/20/2019  Glucose 70 - 99 mg/dL 107(H) 94 99  BUN 8 - 23 mg/dL <4(L) <5(L) <5(L)  Creatinine 0.61 - 1.24 mg/dL 0.73 0.63 0.68  Sodium 135 - 145 mmol/L 140 138 139  Potassium 3.5 - 5.1 mmol/L 3.7 3.9 4.5  Chloride 98  - 111 mmol/L 106 105 107  CO2 22 - 32 mmol/L 25 19(L) 24  Calcium 8.9 - 10.3 mg/dL 8.1(L) 8.3(L) 7.9(L)  Total Protein 6.5 - 8.1 g/dL 7.4 7.0 6.9  Total Bilirubin 0.3 - 1.2 mg/dL 3.0(H) 2.8(H) 1.8(H)  Alkaline Phos 38 - 126 U/L 129(H) 98 116  AST 15 - 41 U/L 86(H) 58(H) 48(H)  ALT 0 - 44 U/L 36 23 18      RADIOGRAPHIC STUDIES: I have personally reviewed the radiological images as listed and agreed with the findings in the report. No results found.   ASSESSMENT &  PLAN:  RYDELL WENDER is a 69 y.o. male with    1. Hepatocellular Carcinoma, multifocal, cT3N0Mx -He was diagnosed in 08/2017 given he has at least 4 separate hepatic masses with the largest measuring 5.5 cm with typical imaging features of HCC. AFP is elevated to 2431;this is diagnostic forhepatocellular carcinoma. -Due to the size and multifocal disease he is not a candidate for liver transplant or resection.His liver cancer is not curableat this stage, but still treatable. -He underwentbilateral hepatic arteryY90 treatments on 11/06/17 and 12/04/17 with Dr. Annamaria Boots. He tolerated the procedure very well but had mixed response. -On 05/06/18 he underwent right hepatic bland embolization by Dr. Annamaria Boots. He was on Observation since.  -His 04/25/2019 liver MRI showed disease progression with new lesions in the left lobe, stable disease in the right lobe.  No extrahepatic notable distant metastasis.   -He underwent Y90 embolization to left lobe lvier on 06/20/19 with IR Dr Annamaria Boots  -He has had ongoing and worsened symptoms of taste change, appetite loss, weight loss and has persistent occasional dizziness. I discussed his symptoms are likely related to his liver cancer.  -His CT scan from 07/12/19 showed overall stable disease with some mild improvement in multifocal liver cancer.  His response to latest Y90 may not last much longer.  -I discussed starting systemic treatment with goal to control his disease. I recommended IV  Immunotherapy Tecentriq and biological agent bevacizumab q3weeks as first line treatment, based on the IMbrave 150 trial data. He is interested in IV treatment and willing to start next week. -We discussed the potential side effects, especially autoimmune related pneumonitis, colitis, endocrine disorders, skin rash, fatigue, hypertension, proteinuria, bowel perforation, bleeding, thrombosis,etc, he voiced good understanding and agrees to proceed.  -The goal of therapy is palliative for disease control and improve his quality of life -we also discussed other options of single agent immunotherapy, or TKI such as sorafenib or lenvatinib.  Patient prefers immunotherapy than TKI. -He will proceed with upper endoscopy to evaluate for Varices. If adequate I will add Avastin.  -Physical exam today shows epigastric tenderness and LE edema. I recommend he elevated legs.  -Labs reviewed, Hg 12/8, plt 53K. CMP and AFP still pending. Will monitor his TSH level on immunotherapy. -F/u in with C2 treatment in 4 weeks.    2. Fatigue, Taste change, Lower eating and weight loss  -Has been ongoing for the past in the past few months.  -He has not been able to return to work since [place] due to fatigue.  -He has adequate appetite but after 1 bite he will not want to eat more. His weight is trending down. -Based on his MRI symptoms are likely related to disease progression but s/p recent Y90 his symptoms have worsened.  -He also notes years of occasional dizziness and near syncope. This still occurs. Will monitor.  -I discussed theses symptoms are likely related to his liver cancer.  -He will continue nutritional supplement. I will cal lin Mirtazapine 7.5mg  to help his appetite and sleep (08/03/19). He is agreeable.    3. 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 Plan to repeat in 11/2019. I recommend he repeat sooner to start avastin with systemic treatment.  -We recommend he continue to abstain from alcohol to prevent further liver  damage and try to quit smoking.   4. COPD, Smoking Cessation, Cough -He has some intermittent SOBand cough, likely related to his smoking.PCP started him on inhaler -His PCP started him on inhaleras needed for SOB.He has developed a cough. -He has reduced down toa few cigarettes a day, Iagainencouraged him to complete cessation.  5. Goal of care discussion  -We again discussed the incurable nature of his cancer, and the overall poor prognosis, especially if he does not have good response to therapy or progress on therapy -The patient understands the goal of care is palliative. -he is full code now  PLAN: -I called in Mirtazapine today  -Tecentriq next week  -Lab, f/u and Tecentriq and avastin in 4 weeks.  -Phone visit in 2 weeks  -will message Dr. Ardis Hughs to get his EGD done in next 3-4 weeks    No problem-specific Assessment & Plan notes found for this encounter.   Orders Placed This Encounter  Procedures  . TSH    Standing Status:   Standing    Number of Occurrences:   10    Standing Expiration Date:   08/02/2020   All questions were answered. The patient knows to call the clinic with any problems, questions or concerns. No barriers to learning was detected. The total time spent in the appointment was 40 minutes.     Truitt Merle, MD 08/03/2019   I, Joslyn Devon, am acting as scribe for Truitt Merle, MD.   I have reviewed the above documentation for accuracy and completeness, and I agree with the above.

## 2019-08-01 ENCOUNTER — Encounter: Payer: Self-pay | Admitting: Nurse Practitioner

## 2019-08-01 ENCOUNTER — Ambulatory Visit: Payer: Medicare HMO | Admitting: Nurse Practitioner

## 2019-08-01 VITALS — BP 110/70 | HR 109 | Temp 98.3°F | Ht 67.0 in | Wt 135.0 lb

## 2019-08-01 DIAGNOSIS — K7030 Alcoholic cirrhosis of liver without ascites: Secondary | ICD-10-CM | POA: Diagnosis not present

## 2019-08-01 DIAGNOSIS — K766 Portal hypertension: Secondary | ICD-10-CM | POA: Diagnosis not present

## 2019-08-01 DIAGNOSIS — I8500 Esophageal varices without bleeding: Secondary | ICD-10-CM | POA: Diagnosis not present

## 2019-08-01 DIAGNOSIS — K7469 Other cirrhosis of liver: Secondary | ICD-10-CM | POA: Diagnosis not present

## 2019-08-01 DIAGNOSIS — Z7289 Other problems related to lifestyle: Secondary | ICD-10-CM | POA: Diagnosis not present

## 2019-08-01 DIAGNOSIS — R630 Anorexia: Secondary | ICD-10-CM | POA: Diagnosis not present

## 2019-08-01 DIAGNOSIS — C220 Liver cell carcinoma: Secondary | ICD-10-CM | POA: Diagnosis not present

## 2019-08-01 DIAGNOSIS — B182 Chronic viral hepatitis C: Secondary | ICD-10-CM | POA: Diagnosis not present

## 2019-08-01 NOTE — Patient Instructions (Signed)
If you are age 69 or older, your body mass index should be between 23-30. Your Body mass index is 21.14 kg/m. If this is out of the aforementioned range listed, please consider follow up with your Primary Care Provider.  If you are age 62 or younger, your body mass index should be between 19-25. Your Body mass index is 21.14 kg/m. If this is out of the aformentioned range listed, please consider follow up with your Primary Care Provider.   Call the office if have any abdominal pain, nausea, vomiting and/or trouble swallowing.

## 2019-08-01 NOTE — Progress Notes (Signed)
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  --Despite MELD score he clinically appears compensated --followed by Dr. Burr Medico --Due to size of lesions and multifocal disease he wasn't candidate for liver transplant or resection. He has been treated with Y90 treatments and embolization.  MRI 04/25/19 showed disease progression in left lobe, stable right lobe.  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 Due for surveillance EGD Aug 2021 but this will depend on his clinical course.  -He has mild pedal edema. No evidence for ascites. I don't think he needs a diuretic, especially since taking such limited amount of PO. Some of the edema could be from hypoalbuminemia.    # Reduced PO intake / weight loss (now stable) --food doesn't taste good. Hungry but loses appetite when starts to eat --Recent nausea / vomiting, resolved two weeks ago. His weight has been stable at 135 pounds for almost 3 weeks now.  --Symptoms may be due in part to progression of [place] .  --Oncology increased Boost supplements. --Is he a candidate for megace?  --Patient understands that should he develop recurrent N/V or dysphagia then EGD would be a reasonable next step. At this point patient says he just needs to eat   HPI:    Primary GI: Dr. Ardis Hughs  Chief complaint : weight loss  Patient is a 70 year old male with history of HCV cirrhosis (also hx of Etoh use) and [place].  Patient has not been seen here since varices screening EGD August 2019. At one time he was followed by [facility] but not seen there either in some time since HCV treatment wasn't  started due to [facility] diagnosis  Patient seen in the ED 07/13/19 with dehydration and weight loss. He had been feeling poorly for weeks. Of note, HCC disease progression noted on MRI in January.  ED labs remarkable for Tbili 2.8, albumin 2.4. WBC was normal at 4.0. Hgb 13.3. INR 1.7.  Lipase 47 . CT scan of the chest / abdomen and pelvis with contrast remarkable for gallstones, multiple liver masses as previously seen, diffuse diverticular disease without evidence for acute inflammation.  Treated with IV fluids, discharged home.   ED note from 3/31 mentions trouble swallowing, vomiting and abdominal pain.He thinks the abdominal pain was just being sore from retching and he says the nausea and vomiting stopped two weeks ago.  He was having some problems swallowing food but that passed. Currently no abdominal pain just "sore" if he pushes on mid upper abdomen.  Though Nareg denies further nausea / vomiting / dysphagia he still doesn't want to eat. He gets hungry but then loses appetite after one bite. Will not even eat his favorite foods.  He lost 11 pounds between January and the end of March ( 141 pounds >>> 129 pounds) but since 07/14/19 has been stable at 135 pounds. He has had swelling of ankles / feet and told it could be related to sodium intake  Review of systems:     No chest pain, no SOB, no fevers, no urinary sx   Past Medical History:  Diagnosis Date  . Alcohol dependence ([place])   .  Anemia    denies  . Arthritis   . Cataract   . Cirrhosis ([place])   . Coronary artery disease due to lipid rich plaque   . Depression   . Dyspnea   . Hepatitis C    Has A and B also  . PAD (peripheral artery disease) ([place])    pt reports he has this and has chronic leg "soreness"  . Peripheral vascular disease ([place])    essentially normal circulations with two-vessel  runoff to the feet bilaterally  . Tobacco abuse     Patient's surgical history, family medical history, social history, medications and allergies  were all reviewed in Epic   Creatinine clearance cannot be calculated (Unknown ideal weight.)  Current Outpatient Medications  Medication Sig Dispense Refill  . albuterol (PROVENTIL HFA;VENTOLIN HFA) 108 (90 Base) MCG/ACT inhaler Inhale 2 puffs into the lungs every 6 (six) hours as needed for wheezing or shortness of breath. 1 Inhaler 6  . aspirin 81 MG tablet Take 81 mg by mouth daily.    . Cholecalciferol (VITAMIN D3 PO) Take 125 mcg by mouth daily.    Marland Kitchen dexlansoprazole (DEXILANT) 60 MG capsule Take 1 capsule (60 mg total) by mouth daily. 15 capsule 0  . LORazepam (ATIVAN) 0.5 MG tablet Take 1 tablet (0.5 mg) approximately 30 minutes prior to MRI. If still anxious, may take additional 1 tablet (0.5 mg). 2 tablet 0  . mometasone-formoterol (DULERA) 200-5 MCG/ACT AERO Inhale 2 puffs into the lungs 2 (two) times daily. 8.8 g 3  . Multiple Vitamin (MULTIVITAMIN WITH MINERALS) TABS tablet Take 1 tablet by mouth daily.    . pantoprazole (PROTONIX) 40 MG tablet 40 mg one tab 30 minutes before breakfast meal 90 tablet 3  . tiotropium (SPIRIVA) 18 MCG inhalation capsule Place 1 capsule (18 mcg total) into inhaler and inhale daily. 30 capsule 6  . Turmeric (QC TUMERIC COMPLEX) 500 MG CAPS Take by mouth daily.    Marland Kitchen zinc gluconate 50 MG tablet Take 50 mg by mouth daily.     No current facility-administered medications for this visit.    Physical Exam:     BP 110/70 (BP Location: Left Arm, Patient Position: Sitting, Cuff Size: Normal)   Pulse (!) 109   Temp 98.3 F (36.8 C)   Ht 5\' 7"  (1.702 m)   Wt 135 lb (61.2 kg)   BMI 21.14 kg/m   GENERAL:  Pleasant thin male in NAD PSYCH: : Cooperative, normal affect CARDIAC:  RRR,  1+ BLE edema PULM: Normal respiratory effort, lungs CTA bilaterally, no wheezing ABDOMEN:  Nondistended, soft, mild RUQ tenderness. No obvious masses, no hepatomegaly,  normal bowel sounds SKIN:  turgor, no lesions seen Musculoskeletal:  Normal muscle tone, normal  strength NEURO: Alert and oriented x 3, no focal neurologic deficits  I spent 30 minutes total reviewing records, obtaining history, performing exam, counseling patient and documenting visit / findings.   Tye Savoy , NP 08/01/2019, 1:27 PM

## 2019-08-02 NOTE — Progress Notes (Signed)
I agree with the above note, plan.  Not sure about megace, I really never prescribe it.  It's a good idea though.  I'll forward this note to Dr. Burr Medico as well to see what she thinks about megace to stimulate his appetite.Marland Kitchen

## 2019-08-03 ENCOUNTER — Inpatient Hospital Stay: Payer: Medicare HMO | Attending: Hematology | Admitting: Hematology

## 2019-08-03 ENCOUNTER — Encounter: Payer: Self-pay | Admitting: Hematology

## 2019-08-03 ENCOUNTER — Inpatient Hospital Stay: Payer: Medicare HMO

## 2019-08-03 ENCOUNTER — Other Ambulatory Visit: Payer: Self-pay

## 2019-08-03 VITALS — BP 112/100 | HR 109 | Temp 98.5°F | Resp 18 | Ht 67.0 in | Wt 134.4 lb

## 2019-08-03 DIAGNOSIS — Z5112 Encounter for antineoplastic immunotherapy: Secondary | ICD-10-CM | POA: Insufficient documentation

## 2019-08-03 DIAGNOSIS — R42 Dizziness and giddiness: Secondary | ICD-10-CM | POA: Insufficient documentation

## 2019-08-03 DIAGNOSIS — J449 Chronic obstructive pulmonary disease, unspecified: Secondary | ICD-10-CM | POA: Diagnosis not present

## 2019-08-03 DIAGNOSIS — F17210 Nicotine dependence, cigarettes, uncomplicated: Secondary | ICD-10-CM | POA: Insufficient documentation

## 2019-08-03 DIAGNOSIS — C220 Liver cell carcinoma: Secondary | ICD-10-CM | POA: Insufficient documentation

## 2019-08-03 DIAGNOSIS — R55 Syncope and collapse: Secondary | ICD-10-CM | POA: Diagnosis not present

## 2019-08-03 DIAGNOSIS — Z7189 Other specified counseling: Secondary | ICD-10-CM | POA: Insufficient documentation

## 2019-08-03 DIAGNOSIS — K7469 Other cirrhosis of liver: Secondary | ICD-10-CM | POA: Diagnosis not present

## 2019-08-03 DIAGNOSIS — K7460 Unspecified cirrhosis of liver: Secondary | ICD-10-CM | POA: Diagnosis not present

## 2019-08-03 DIAGNOSIS — R5383 Other fatigue: Secondary | ICD-10-CM | POA: Diagnosis not present

## 2019-08-03 DIAGNOSIS — R634 Abnormal weight loss: Secondary | ICD-10-CM | POA: Diagnosis not present

## 2019-08-03 DIAGNOSIS — B182 Chronic viral hepatitis C: Secondary | ICD-10-CM | POA: Diagnosis not present

## 2019-08-03 DIAGNOSIS — Z79899 Other long term (current) drug therapy: Secondary | ICD-10-CM | POA: Insufficient documentation

## 2019-08-03 LAB — CBC WITH DIFFERENTIAL (CANCER CENTER ONLY)
Abs Immature Granulocytes: 0.02 10*3/uL (ref 0.00–0.07)
Basophils Absolute: 0 10*3/uL (ref 0.0–0.1)
Basophils Relative: 1 %
Eosinophils Absolute: 0.1 10*3/uL (ref 0.0–0.5)
Eosinophils Relative: 2 %
HCT: 36.5 % — ABNORMAL LOW (ref 39.0–52.0)
Hemoglobin: 12.8 g/dL — ABNORMAL LOW (ref 13.0–17.0)
Immature Granulocytes: 1 %
Lymphocytes Relative: 26 %
Lymphs Abs: 1.1 10*3/uL (ref 0.7–4.0)
MCH: 37.6 pg — ABNORMAL HIGH (ref 26.0–34.0)
MCHC: 35.1 g/dL (ref 30.0–36.0)
MCV: 107.4 fL — ABNORMAL HIGH (ref 80.0–100.0)
Monocytes Absolute: 0.6 10*3/uL (ref 0.1–1.0)
Monocytes Relative: 13 %
Neutro Abs: 2.5 10*3/uL (ref 1.7–7.7)
Neutrophils Relative %: 57 %
Platelet Count: 53 10*3/uL — ABNORMAL LOW (ref 150–400)
RBC: 3.4 MIL/uL — ABNORMAL LOW (ref 4.22–5.81)
RDW: 16.3 % — ABNORMAL HIGH (ref 11.5–15.5)
WBC Count: 4.2 10*3/uL (ref 4.0–10.5)
nRBC: 0 % (ref 0.0–0.2)

## 2019-08-03 LAB — CMP (CANCER CENTER ONLY)
ALT: 36 U/L (ref 0–44)
AST: 86 U/L — ABNORMAL HIGH (ref 15–41)
Albumin: 2.1 g/dL — ABNORMAL LOW (ref 3.5–5.0)
Alkaline Phosphatase: 129 U/L — ABNORMAL HIGH (ref 38–126)
Anion gap: 9 (ref 5–15)
BUN: <4 mg/dL — ABNORMAL LOW (ref 8–23)
CO2: 25 mmol/L (ref 22–32)
Calcium: 8.1 mg/dL — ABNORMAL LOW (ref 8.9–10.3)
Chloride: 106 mmol/L (ref 98–111)
Creatinine: 0.73 mg/dL (ref 0.61–1.24)
GFR, Est AFR Am: >60 mL/min (ref >60)
GFR, Estimated: >60 mL/min (ref >60)
Glucose, Bld: 107 mg/dL — ABNORMAL HIGH (ref 70–99)
Potassium: 3.7 mmol/L (ref 3.5–5.1)
Sodium: 140 mmol/L (ref 135–145)
Total Bilirubin: 3 mg/dL — ABNORMAL HIGH (ref 0.3–1.2)
Total Protein: 7.4 g/dL (ref 6.5–8.1)

## 2019-08-03 LAB — TSH: TSH: 1.486 u[IU]/mL (ref 0.320–4.118)

## 2019-08-03 MED ORDER — MIRTAZAPINE 7.5 MG PO TABS: 7.5000 mg | ORAL_TABLET | Freq: Every day | ORAL | 2 refills | Status: AC

## 2019-08-03 NOTE — Progress Notes (Signed)
START OFF PATHWAY REGIMEN - Other   [id number]:Atezolizumab 1,200 mg IV D1 + Bevacizumab 15 mg/kg IV D1 q21 Days:   A cycle is every 21 Days:     Atezolizumab      Bevacizumab-xxxx   **Always confirm dose/schedule in your pharmacy ordering system**  Patient Characteristics: Intent of Therapy: Non-Curative / Palliative Intent, Discussed with Patient 

## 2019-08-04 ENCOUNTER — Telehealth: Payer: Self-pay | Admitting: Hematology

## 2019-08-04 ENCOUNTER — Telehealth: Payer: Self-pay

## 2019-08-04 DIAGNOSIS — K7030 Alcoholic cirrhosis of liver without ascites: Secondary | ICD-10-CM

## 2019-08-04 LAB — AFP TUMOR MARKER: AFP, Serum, Tumor Marker: 339 ng/mL — ABNORMAL HIGH (ref 0.0–8.3)

## 2019-08-04 NOTE — Telephone Encounter (Signed)
Scheduled appt per 4/21 los.  Left a vm of the appt date and time. 

## 2019-08-04 NOTE — Telephone Encounter (Signed)
-----   Message from Milus Banister, MD sent at 08/04/2019  5:24 AM EDT ----- Krista Blue, That makes sense, we'll get him in quickly. thanks  Abdallah Hern, He needs EGD [facility] with me in next 1-2 weeks for cirrhosis, varices.  Thanks  ----- Message ----- From: Truitt Merle, MD Sent: 08/03/2019   4:59 PM EDT To: Milus Banister, MD, Greggory Keen, MD  Jacqulyn Cane,  I plan to start him on Tecentriq and Avastn next week. I think his multiple symptoms are probably related to his [facility], I am hoping treatment will improve his quality of life.   Could you get his EGD done in the next 3-4 weeks, littler sooner than you planned? I will not give Avastin if he has severe untreated varices.   Thanks much  [employer]

## 2019-08-04 NOTE — Telephone Encounter (Signed)
Left message on machine to call back  

## 2019-08-05 NOTE — Telephone Encounter (Signed)
Left message on machine to call back  

## 2019-08-05 NOTE — Progress Notes (Signed)
Pharmacist Chemotherapy Monitoring - Initial Assessment    Anticipated start date: 08/11/19  Regimen:  . Are orders appropriate based on the patient's diagnosis, regimen, and cycle? Yes . Does the plan date match the patient's scheduled date? Yes . Is the sequencing of drugs appropriate? Yes . Are the premedications appropriate for the patient's regimen? Yes . Prior Authorization for treatment is: Pending o If applicable, is the correct biosimilar selected based on the patient's insurance? yes  Organ Function and Labs: Marland Kitchen Are dose adjustments needed based on the patient's renal function, hepatic function, or hematologic function? No . Are appropriate labs ordered prior to the start of patient's treatment? Yes . Other organ system assessment, if indicated: bevacizumab: baseline BP . The following baseline labs, if indicated, have been ordered: atezolizumab: baseline TSH +/- T4 and bevacizumab: urine protein  Dose Assessment: . Are the drug doses appropriate? Yes . Are the following correct: o Drug concentrations Yes o IV fluid compatible with drug Yes o Administration routes Yes o Timing of therapy Yes . If applicable, does the patient have documented access for treatment and/or plans for port-a-cath placement? ? . If applicable, have lifetime cumulative doses been properly documented and assessed? not applicable Lifetime Dose Tracking  No doses have been documented on this patient for the following tracked chemicals: Doxorubicin, Epirubicin, Idarubicin, Daunorubicin, Mitoxantrone, Bleomycin, Oxaliplatin, Carboplatin, Liposomal Doxorubicin  o   Toxicity Monitoring/Prevention: . The patient has the following take home antiemetics prescribed: compazine, zofran, lorazepam . The patient has the following take home medications prescribed: N/A . Medication allergies and previous infusion related reactions, if applicable, have been reviewed and addressed. Yes . The patient's current  medication list has been assessed for drug-drug interactions with their chemotherapy regimen. no significant drug-drug interactions were identified on review.  Order Review: . Are the treatment plan orders signed? Yes . Is the patient scheduled to see a provider prior to their treatment? Yes  I verify that I have reviewed each item in the above checklist and answered each question accordingly.  Zachary Steele 08/05/2019 12:53 PM

## 2019-08-07 NOTE — Progress Notes (Signed)
Zachary Steele is a 69 y.o. male who presents for annual wellness visit, CPE and follow-up on chronic medical conditions.  He has the following concerns:  Decreased appetite and weight loss due to liver cancer. This is being managed by his GI and Dr. Burr Medico, oncologist. EDG scheduled.  Started on mirtazapine on 08/04/2019.   Drinking fluids and keeping them down. Still not eating much food. Aware that he needs more protein in his diet.   Nails on right hand with white patches. This is new per patient.   Smoking 1 cigarette daily.   States he does not drink alcohol regularly any longer but he did have 1 wine cooler on his birthday a couple of weeks ago.   Lives with his wife. Is not working. Has one daughter.    Immunization History  Administered Date(s) Administered  . Influenza, High Dose Seasonal PF 02/15/2018  . Pneumococcal Polysaccharide-23 09/19/2011   Last colonoscopy: 2017 Last PSA: declines  Dentist: dentures Ophtho: years ago  Exercise: none. Plans to go back to the [facility]   Other doctors caring for patient include: Dr. Ardis Hughs- GI Dr. Gwenlyn Found- Cardiology Dr. Burr Medico- Oncology Kizzie Fantasia, NP- pulmonologist  Dr. Oneida Alar- Vascular   Depression screen:  See questionnaire below.     Depression screen [facility] 2/9 08/08/2019 01/03/2019 05/19/2017  Decreased Interest 0 0 0  Down, Depressed, Hopeless 0 1 0  PHQ - 2 Score 0 1 0    Fall Screen: See Questionaire below.   Fall Risk  08/08/2019 05/19/2017  Falls in the past year? 1 No  Number falls in past yr: 1 -  Injury with Fall? 0 -    ADL screen:  See questionnaire below.  Functional Status Survey: Is the patient deaf or have difficulty hearing?: No Does the patient have difficulty seeing, even when wearing glasses/contacts?: Yes Does the patient have difficulty concentrating, remembering, or making decisions?: Yes Does the patient have difficulty walking or climbing stairs?: Yes(gets SOB) Does the patient have  difficulty dressing or bathing?: No Does the patient have difficulty doing errands alone such as visiting a doctor's office or shopping?: No   End of Life Discussion:  Patient does not have a living will and medical power of attorney. His wife Nicki Reaper will make his decisions. Paperwork provided and discussed. MOST form filled out.    Review of Systems  Constitutional: -fever, -chills, -sweats, -unexpected weight change, -anorexia, -fatigue Allergy: -sneezing, -itching, -congestion Dermatology: denies changing moles, rash, lumps, new worrisome lesions ENT: -runny nose, -ear pain, -sore throat, -hoarseness, -sinus pain, -teeth pain, -tinnitus, -hearing loss, -epistaxis Cardiology:  -chest pain, -palpitations, -edema, -orthopnea, -paroxysmal nocturnal dyspnea Respiratory: -cough, -shortness of breath, -dyspnea on exertion, -wheezing, -hemoptysis Gastroenterology: -abdominal pain, -nausea, -vomiting, -diarrhea, -constipation, -blood in stool, -changes in bowel movement, -dysphagia Hematology: -bleeding or bruising problems Musculoskeletal: -arthralgias, -myalgias, -joint swelling, -back pain, -neck pain, -cramping, -gait changes Ophthalmology: -vision changes, -eye redness, -itching, -discharge Urology: -dysuria, -difficulty urinating, -hematuria, -urinary frequency, -urgency, incontinence Neurology: -headache, -weakness, -tingling, -numbness, -speech abnormality, -memory loss, -falls, -dizziness Psychology:  -depressed mood, -agitation, -sleep problems   PHYSICAL EXAM:  BP 120/74   Pulse (!) 104   Temp (!) 97.3 F (36.3 C)   Ht 5' 8.5" (1.74 m)   Wt 134 lb 9.6 oz (61.1 kg)   SpO2 98%   BMI 20.17 kg/m   General Appearance: Alert, cooperative, no distress, appears stated age Head: Normocephalic, without obvious abnormality, atraumatic Eyes: PERRL, conjunctiva/corneas clear, EOM's intact  Ears: Normal TM's and external ear canals Nose: Mask in place  Throat: Mask in place  Neck:  Supple, no lymphadenopathy, thyroid:no enlargement/tenderness/nodules Back: ROM normal, no CVA tenderness Lungs: Clear to auscultation bilaterally without wheezes, rales or ronchi; respirations unlabored Chest Wall: No tenderness or deformity Heart: Regular rate and rhythm, no murmur, rub or gallop Breast Exam: No chest wall tenderness, masses or gynecomastia Abdomen: Soft, diffuse TTP without rebound, nondistended, normoactive bowel sounds Genitalia: declines  Extremities: No clubbing, cyanosis or edema Pulses: 2+ and symmetric all extremities Skin: Skin color, texture, turgor normal, no rashes or lesions Lymph nodes: Cervical, supraclavicular, and axillary nodes normal Neurologic: CNII-XII intact, normal strength, sensation and gait Psych: Normal mood, affect, hygiene and grooming  ASSESSMENT/PLAN: Medicare annual wellness visit, subsequent Here today for Medicare wellness visit.  Denies any recent falls, mood changes, memory changes.  He seems to be managing ADLs.  Lives with his wife, her daughter and grandchildren.  Counseling on advanced directives.  Immunizations reviewed.  List of specialist updated.  Medications reviewed.  Routine general medical examination at a health care facility -He is also here today for CPE.  Declines labs. PSA declined.  Recent CT abdomen pelvis showed unremarkable prostate gland.  Colonoscopy up-to-date.  Immunizations reviewed.  Counseling on healthy lifestyle including avoiding alcohol and stopping smoking entirely.  He seems to have pretty good quality of life.  Advance directive discussed with patient -Healthcare power of attorney and living will forms provided for patient and discussed.  He will take these home and if he decides to fill them out he may return to have them notarized and scanned into his EMR.  MOST form filled out.  Full code status -MOST form filled out with patient.  He desires to be full code.  Hepatocellular carcinoma  ([place]) -Closely followed by Dr. Annamaria Boots in GI  Peripheral arterial disease ([place]) -Dr. Oneida Alar is his vascular surgeon.  3-vessel CAD -Statin is contraindicated.  He is aware that he has atherosclerosis  Statins contraindicated -Due to hepatocellular carcinoma  Immunization counseling -He will check with Dr. Annamaria Boots and as long as she is okay with him getting vaccines, he will return for nurse visit to get a pneumonia vaccine here in my office, Prevnar 13.  He will need [facility] to get his T[facility] due to insurance coverage.  I also strongly encourage the Covid vaccine as long as his oncologist is in favor of this.  Pulmonary emphysema, unspecified emphysema type ([place]) -Denies any breathing issues.  States he is using his inhalers and doing fine.  He has a pulmonologist  Poor appetite -Being worked up by GI and oncology.  Most likely due to underlying hepatocellular carcinoma.  Reviewed CT abdomen Pelvis results from 07/12/3019 which showed aortic atherosclerosis but no aneurysm. Screening AAA not needed based on this finding.  Reviewed labs from 08/03/2019 and he declines having more labs done today. He is closely followed by Dr. Burr Medico and has upcoming EGD with GI scheduled.    Discussed PSA screening (risks/benefits), recommended at least 30 minutes of aerobic activity at least 5 days/week; proper sunscreen use reviewed; healthy diet and alcohol recommendations (less than or equal to 2 drinks/day) reviewed; regular seatbelt use; changing batteries in smoke detectors. Immunization recommendations discussed.  Colonoscopy recommendations reviewed.   Medicare Attestation I have personally reviewed: The patient's medical and social history Their use of alcohol, tobacco or illicit drugs Their current medications and supplements The patient's functional ability including ADLs,fall  risks, home safety risks, cognitive, and hearing and visual impairment Diet and physical  activities Evidence for depression or mood disorders  The patient's weight, height, and BMI have been recorded in the chart.  I have made referrals, counseling, and provided education to the patient based on review of the above and I have provided the patient with a written personalized care plan for preventive services.     Harland Dingwall, NP-C   08/08/2019

## 2019-08-08 ENCOUNTER — Encounter: Payer: Self-pay | Admitting: Family Medicine

## 2019-08-08 ENCOUNTER — Ambulatory Visit (INDEPENDENT_AMBULATORY_CARE_PROVIDER_SITE_OTHER): Payer: Medicare HMO | Admitting: Family Medicine

## 2019-08-08 ENCOUNTER — Other Ambulatory Visit: Payer: Self-pay

## 2019-08-08 VITALS — BP 120/74 | HR 104 | Temp 97.3°F | Ht 68.5 in | Wt 134.6 lb

## 2019-08-08 DIAGNOSIS — Z7189 Other specified counseling: Secondary | ICD-10-CM

## 2019-08-08 DIAGNOSIS — Z0000 Encounter for general adult medical examination without abnormal findings: Secondary | ICD-10-CM

## 2019-08-08 DIAGNOSIS — C220 Liver cell carcinoma: Secondary | ICD-10-CM | POA: Diagnosis not present

## 2019-08-08 DIAGNOSIS — I2510 Atherosclerotic heart disease of native coronary artery without angina pectoris: Secondary | ICD-10-CM | POA: Diagnosis not present

## 2019-08-08 DIAGNOSIS — Z7185 Encounter for immunization safety counseling: Secondary | ICD-10-CM

## 2019-08-08 DIAGNOSIS — I739 Peripheral vascular disease, unspecified: Secondary | ICD-10-CM | POA: Diagnosis not present

## 2019-08-08 DIAGNOSIS — J439 Emphysema, unspecified: Secondary | ICD-10-CM

## 2019-08-08 DIAGNOSIS — R630 Anorexia: Secondary | ICD-10-CM

## 2019-08-08 DIAGNOSIS — Z5309 Procedure and treatment not carried out because of other contraindication: Secondary | ICD-10-CM

## 2019-08-08 DIAGNOSIS — Z789 Other specified health status: Secondary | ICD-10-CM | POA: Insufficient documentation

## 2019-08-08 NOTE — Telephone Encounter (Signed)
The pt has been scheduled for an EGD on 5/5 in the [place].  No pre visit appt are available so the pt will come in on 4/29 at 1030 am and ask for me [facility]) and I will explain the instructions and get the consent signed.  COVID test on 08/11/19 at 920 am.  Instructions printed and amb referral and COVID test in Epic

## 2019-08-08 NOTE — Patient Instructions (Addendum)
Here is a list of Eye Doctors that you call and schedule an appointment with.   [facility] Address: [place], [place], [place] [address] Phone: [phone]  [facility] Address: [address] [place], [place], [place] [address] Phone: [phone]  Dr. Katy Fitch Address: [address] [place], [place], [place] [address] Phone: [phone]   Mr. Nicole Kindred , Thank you for taking time to come for your Medicare Wellness Visit. I appreciate your ongoing commitment to your health goals. Please review the following plan we discussed and let me know if I can assist you in the future.   These are the goals we discussed:  It appears that you are overdue for the pneumonia vaccine and the Tdap (tetanus, diphtheria, and pertussis) Clear this with your oncologist and you can call and schedule a nurse visit to return for the pneumonia vaccine. You would need to get the Tdap at your pharmacy in order for insurance to cover it.  Follow-up as recommended with your specialists   This is a list of the screening recommended for you and due dates:  Health Maintenance  Topic Date Due  . COVID-19 Vaccine (1) Never done  . Tetanus Vaccine  Never done  . Pneumonia vaccines (2 of 2 - PPSV23) 04/22/2017  . Flu Shot  11/13/2019  . Colon Cancer Screening  11/06/2025  .  Hepatitis C: One time screening is recommended by Center for Disease Control  (CDC) for  adults born from [birth year] through 1965.   Completed

## 2019-08-09 ENCOUNTER — Telehealth: Payer: Self-pay | Admitting: Family Medicine

## 2019-08-09 NOTE — Telephone Encounter (Signed)
Pt does not like spiriva wants to change back to Trelegy ?  [facility] [place]

## 2019-08-09 NOTE — Telephone Encounter (Signed)
His pulmonologist prescribes his inhalers. Please ask him to contact them for this. It looks like they have worked on a prior authorization in the past for Trelegy.

## 2019-08-10 NOTE — Telephone Encounter (Signed)
Pt will contact pulmonology

## 2019-08-11 ENCOUNTER — Encounter: Payer: Self-pay | Admitting: Hematology

## 2019-08-11 ENCOUNTER — Other Ambulatory Visit: Payer: Self-pay

## 2019-08-11 ENCOUNTER — Inpatient Hospital Stay: Payer: Medicare HMO

## 2019-08-11 ENCOUNTER — Ambulatory Visit: Payer: Medicare HMO | Admitting: Family Medicine

## 2019-08-11 VITALS — BP 108/73 | HR 106 | Temp 98.5°F | Resp 18

## 2019-08-11 DIAGNOSIS — Z79899 Other long term (current) drug therapy: Secondary | ICD-10-CM | POA: Diagnosis not present

## 2019-08-11 DIAGNOSIS — K7460 Unspecified cirrhosis of liver: Secondary | ICD-10-CM | POA: Diagnosis not present

## 2019-08-11 DIAGNOSIS — C220 Liver cell carcinoma: Secondary | ICD-10-CM

## 2019-08-11 DIAGNOSIS — R55 Syncope and collapse: Secondary | ICD-10-CM | POA: Diagnosis not present

## 2019-08-11 DIAGNOSIS — R110 Nausea: Secondary | ICD-10-CM

## 2019-08-11 DIAGNOSIS — R5383 Other fatigue: Secondary | ICD-10-CM | POA: Diagnosis not present

## 2019-08-11 DIAGNOSIS — R42 Dizziness and giddiness: Secondary | ICD-10-CM | POA: Diagnosis not present

## 2019-08-11 DIAGNOSIS — Z5112 Encounter for antineoplastic immunotherapy: Secondary | ICD-10-CM | POA: Diagnosis not present

## 2019-08-11 DIAGNOSIS — R634 Abnormal weight loss: Secondary | ICD-10-CM | POA: Diagnosis not present

## 2019-08-11 DIAGNOSIS — J449 Chronic obstructive pulmonary disease, unspecified: Secondary | ICD-10-CM | POA: Diagnosis not present

## 2019-08-11 MED ORDER — SODIUM CHLORIDE 0.9 % IV SOLN
Freq: Once | INTRAVENOUS | Status: AC
  Filled 2019-08-11: qty 250

## 2019-08-11 MED ORDER — SODIUM CHLORIDE 0.9 % IV SOLN
1200.0000 mg | Freq: Once | INTRAVENOUS | Status: DC
  Administered 2019-08-11: 14:00:00 1200 mg via INTRAVENOUS
  Filled 2019-08-11: qty 20

## 2019-08-11 MED ORDER — ONDANSETRON HCL 8 MG PO TABS: 8.0000 mg | ORAL_TABLET | Freq: Three times a day (TID) | ORAL | 0 refills | Status: AC | PRN

## 2019-08-11 MED ORDER — SODIUM CHLORIDE 0.9 % IV SOLN
1200.0000 mg | Freq: Once | INTRAVENOUS | Status: AC
  Filled 2019-08-11: qty 20

## 2019-08-11 NOTE — Progress Notes (Signed)
Called pt to introduce myself as his Financial Resource Specialist, discuss copay assistance and the [employer] grant.  I left a msg requesting he return my call if he's interested in applying for the grants. 

## 2019-08-11 NOTE — Progress Notes (Signed)
Per MD okay to treat with labs from 08/03/19 and HR of 106

## 2019-08-11 NOTE — Patient Instructions (Addendum)
[facility] Discharge Instructions for Patients Receiving Chemotherapy  Today you received the following chemotherapy agents Tecentriq  To help prevent nausea and vomiting after your treatment, we encourage you to take your nausea medication as direct   If you develop nausea and vomiting that is not controlled by your nausea medication, call the clinic.   BELOW ARE SYMPTOMS THAT SHOULD BE REPORTED IMMEDIATELY:  *FEVER GREATER THAN 100.5 F  *CHILLS WITH OR WITHOUT FEVER  NAUSEA AND VOMITING THAT IS NOT CONTROLLED WITH YOUR NAUSEA MEDICATION  *UNUSUAL SHORTNESS OF BREATH  *UNUSUAL BRUISING OR BLEEDING  TENDERNESS IN MOUTH AND THROAT WITH OR WITHOUT PRESENCE OF ULCERS  *URINARY PROBLEMS  *BOWEL PROBLEMS  UNUSUAL RASH Items with * indicate a potential emergency and should be followed up as soon as possible.  Feel free to call the clinic should you have any questions or concerns. The clinic phone number is [phone].  Please show the [place] at check-in to the Emergency Department and triage nurse.   Atezolizumab Gildardo Pounds) injection What is this medicine? ATEZOLIZUMAB (a te zoe LIZ ue mab) is a monoclonal antibody. It is used to treat bladder cancer (urothelial cancer), liver cancer, lung cancer, breast cancer, and melanoma. This medicine may be used for other purposes; ask your health care provider or pharmacist if you have questions. COMMON BRAND NAME(S): Tecentriq What should I tell my health care provider before I take this medicine? They need to know if you have any of these conditions:  diabetes  immune system problems  infection  inflammatory bowel disease  liver disease  lung or breathing disease  lupus  nervous system problems like myasthenia gravis or Guillain-Barre syndrome  organ transplant  an unusual or allergic reaction to atezolizumab, other medicines, foods, dyes, or preservatives  pregnant or trying to get  pregnant  breast-feeding How should I use this medicine? This medicine is for infusion into a vein. It is given by a health care professional in a hospital or clinic setting. A special MedGuide will be given to you before each treatment. Be sure to read this information carefully each time. Talk to your pediatrician regarding the use of this medicine in children. Special care may be needed. Overdosage: If you think you have taken too much of this medicine contact a poison control center or emergency room at once. NOTE: This medicine is only for you. Do not share this medicine with others. What if I miss a dose? It is important not to miss your dose. Call your doctor or health care professional if you are unable to keep an appointment. What may interact with this medicine? Interactions have not been studied. This list may not describe all possible interactions. Give your health care provider a list of all the medicines, herbs, non-prescription drugs, or dietary supplements you use. Also tell them if you smoke, drink alcohol, or use illegal drugs. Some items may interact with your medicine. What should I watch for while using this medicine? Your condition will be monitored carefully while you are receiving this medicine. You may need blood work done while you are taking this medicine. Do not become pregnant while taking this medicine or for at least 5 months after stopping it. Women should inform their doctor if they wish to become pregnant or think they might be pregnant. There is a potential for serious side effects to an unborn child. Talk to your health care professional or pharmacist for more information. Do not breast-feed an  infant while taking this medicine or for at least 5 months after the last dose. What side effects may I notice from receiving this medicine? Side effects that you should report to your doctor or health care professional as soon as possible:  allergic reactions like skin  rash, itching or hives, swelling of the face, lips, or tongue  black, tarry stools  bloody or watery diarrhea  breathing problems  changes in vision  chest pain or chest tightness  chills  facial flushing  fever  headache  signs and symptoms of high blood sugar such as dizziness; dry mouth; dry skin; fruity breath; nausea; stomach pain; increased hunger or thirst; increased urination  signs and symptoms of liver injury like dark yellow or brown urine; general ill feeling or flu-like symptoms; light-colored stools; loss of appetite; nausea; right upper belly pain; unusually weak or tired; yellowing of the eyes or skin  stomach pain  trouble passing urine or change in the amount of urine Side effects that usually do not require medical attention (report to your doctor or health care professional if they continue or are bothersome):  bone pain  cough  diarrhea  joint pain  muscle pain  muscle weakness  swelling of arms or legs  tiredness  weight loss This list may not describe all possible side effects. Call your doctor for medical advice about side effects. You may report side effects to FDA at 1-800-FDA-1088. Where should I keep my medicine? This drug is given in a hospital or clinic and will not be stored at home. NOTE: This sheet is a summary. It may not cover all possible information. If you have questions about this medicine, talk to your doctor, pharmacist, or health care provider.  2020 Elsevier/Gold Standard (2018-11-19 13:11:14)

## 2019-08-12 ENCOUNTER — Telehealth: Payer: Self-pay | Admitting: *Deleted

## 2019-08-12 NOTE — Telephone Encounter (Signed)
-----   Message from Myrtie Hawk, RN sent at 08/11/2019  2:37 PM EDT ----- Regarding: Dr. Burr Medico first time f/u Pt of Dr. Burr Medico, first time tecentriq follow up.  Pt tolerated treatment well.

## 2019-08-12 NOTE — Telephone Encounter (Signed)
Called & left message for pt to return call to discuss how he did with his treatment yest.   

## 2019-08-15 ENCOUNTER — Ambulatory Visit (INDEPENDENT_AMBULATORY_CARE_PROVIDER_SITE_OTHER): Payer: Medicare HMO

## 2019-08-15 ENCOUNTER — Other Ambulatory Visit: Payer: Self-pay | Admitting: Gastroenterology

## 2019-08-15 DIAGNOSIS — Z1159 Encounter for screening for other viral diseases: Secondary | ICD-10-CM

## 2019-08-15 LAB — SARS CORONAVIRUS 2 (TAT 6-24 HRS): SARS Coronavirus 2: NEGATIVE

## 2019-08-16 ENCOUNTER — Telehealth: Payer: Self-pay | Admitting: Hematology

## 2019-08-17 ENCOUNTER — Encounter: Payer: Self-pay | Admitting: Gastroenterology

## 2019-08-17 ENCOUNTER — Other Ambulatory Visit: Payer: Self-pay

## 2019-08-17 ENCOUNTER — Telehealth: Payer: Self-pay

## 2019-08-17 ENCOUNTER — Ambulatory Visit (AMBULATORY_SURGERY_CENTER): Payer: Medicare HMO | Admitting: Gastroenterology

## 2019-08-17 ENCOUNTER — Inpatient Hospital Stay: Payer: Medicare HMO | Admitting: Hematology

## 2019-08-17 VITALS — BP 108/74 | HR 86 | Temp 97.7°F | Resp 15 | Ht 67.0 in | Wt 135.0 lb

## 2019-08-17 DIAGNOSIS — K2900 Acute gastritis without bleeding: Secondary | ICD-10-CM | POA: Diagnosis not present

## 2019-08-17 DIAGNOSIS — K7030 Alcoholic cirrhosis of liver without ascites: Secondary | ICD-10-CM

## 2019-08-17 DIAGNOSIS — B9681 Helicobacter pylori [H. pylori] as the cause of diseases classified elsewhere: Secondary | ICD-10-CM

## 2019-08-17 DIAGNOSIS — K766 Portal hypertension: Secondary | ICD-10-CM | POA: Diagnosis not present

## 2019-08-17 DIAGNOSIS — K2951 Unspecified chronic gastritis with bleeding: Secondary | ICD-10-CM | POA: Diagnosis not present

## 2019-08-17 MED ORDER — SODIUM CHLORIDE 0.9 % IV SOLN: 500.0000 mL | Freq: Once | INTRAVENOUS | Status: DC

## 2019-08-17 NOTE — Patient Instructions (Addendum)
Thank you for allowing [place] to care for you today!  Await biopsy results, approximately 7-10 days.  Will make recommendations , if any, at that time.  Resume previous diet and medications today.  Return to your normal activities tomorrow.      YOU HAD AN ENDOSCOPIC PROCEDURE TODAY AT [facility]:   Refer to the procedure report that was given to you for any specific questions about what was found during the examination.  If the procedure report does not answer your questions, please call your gastroenterologist to clarify.  If you requested that your care partner not be given the details of your procedure findings, then the procedure report has been included in a sealed envelope for you to review at your convenience later.  YOU SHOULD EXPECT: Some feelings of bloating in the abdomen. Passage of more gas than usual.  Walking can help get rid of the air that was put into your GI tract during the procedure and reduce the bloating. If you had a lower endoscopy (such as a colonoscopy or flexible sigmoidoscopy) you may notice spotting of blood in your stool or on the toilet paper. If you underwent a bowel prep for your procedure, you may not have a normal bowel movement for a few days.  Please Note:  You might notice some irritation and congestion in your nose or some drainage.  This is from the oxygen used during your procedure.  There is no need for concern and it should clear up in a day or so.  SYMPTOMS TO REPORT IMMEDIATELY:     Following upper endoscopy (EGD)  Vomiting of blood or coffee ground material  New chest pain or pain under the shoulder blades  Painful or persistently difficult swallowing  New shortness of breath  Fever of 100F or higher  Black, tarry-looking stools  For urgent or emergent issues, a gastroenterologist can be reached at any hour by calling [phone]. Do not use MyChart messaging for urgent concerns.    DIET:  We do recommend a small  meal at first, but then you may proceed to your regular diet.  Drink plenty of fluids but you should avoid alcoholic beverages for 24 hours.  ACTIVITY:  You should plan to take it easy for the rest of today and you should NOT DRIVE or use heavy machinery until tomorrow (because of the sedation medicines used during the test).    FOLLOW UP: Our staff will call the number listed on your records 48-72 hours following your procedure to check on you and address any questions or concerns that you may have regarding the information given to you following your procedure. If we do not reach you, we will leave a message.  We will attempt to reach you two times.  During this call, we will ask if you have developed any symptoms of COVID 19. If you develop any symptoms (ie: fever, flu-like symptoms, shortness of breath, cough etc.) before then, please call [phone].  If you test positive for Covid 19 in the 2 weeks post procedure, please call and report this information to [place].    If any biopsies were taken you will be contacted by phone or by letter within the next 1-3 weeks.  Please call us at [phone] if you have not heard about the biopsies in 3 weeks.    SIGNATURES/CONFIDENTIALITY: You and/or your care partner have signed paperwork which will be entered into your electronic medical record.  These signatures attest to  the fact that that the information above on your After Visit Summary has been reviewed and is understood.  Full responsibility of the confidentiality of this discharge information lies with you and/or your care-partner.

## 2019-08-17 NOTE — Progress Notes (Signed)
pt tolerated well. VSS. awake and to recovery. Report given to RN. Bite block inserted and removed with ease. Atraumatic. 

## 2019-08-17 NOTE — Op Note (Signed)
[facility] Patient Name: Zachary Steele Procedure Date: 08/17/2019 11:00 AM MRN: [id number] Endoscopist: Milus Banister , MD Age: 69 Referring MD:  Date of Birth: 09-17-1950 Gender: Male Account #: [id number] Procedure:                Upper GI endoscopy Indications:              anorexia, known multifocal HCC, previously                            documented small esophageal varices, here for                            evaluation of varices prior to avastin (Dr. Burr Medico) Medicines:                Monitored Anesthesia Care Procedure:                Pre-Anesthesia Assessment:                           - Prior to the procedure, a History and Physical                            was performed, and patient medications and                            allergies were reviewed. The patient's tolerance of                            previous anesthesia was also reviewed. The risks                            and benefits of the procedure and the sedation                            options and risks were discussed with the patient.                            All questions were answered, and informed consent                            was obtained. Prior Anticoagulants: The patient has                            taken no previous anticoagulant or antiplatelet                            agents. ASA Grade Assessment: III - A patient with                            severe systemic disease. After reviewing the risks                            and benefits, the patient was deemed in  satisfactory condition to undergo the procedure.                           After obtaining informed consent, the endoscope was                            passed under direct vision. Throughout the                            procedure, the patient's blood pressure, pulse, and                            oxygen saturations were monitored continuously. The                            Endoscope was  introduced through the mouth, and                            advanced to the second part of duodenum. The upper                            GI endoscopy was accomplished without difficulty.                            The patient tolerated the procedure well. Scope In: Scope Out: Findings:                 Mild inflammation characterized by erythema,                            friability and granularity was found in the entire                            examined stomach. Biopsies were taken with a cold                            forceps for histology.                           Very subtle, mild portal gastropathy changes in the                            proximal stomach.                           No esophageal or gastric varices. Complications:            No immediate complications. Estimated blood loss:                            None. Estimated Blood Loss:     Estimated blood loss: none. Impression:               - Gastritis. Biopsied to check for H. pylori.                           -  Mild portal gastropathy changes but no esophageal                            or gastric varices. Recommendation:           - Patient has a contact number available for                            emergencies. The signs and symptoms of potential                            delayed complications were discussed with the                            patient. Return to normal activities tomorrow.                            Written discharge instructions were provided to the                            patient.                           - Resume previous diet.                           - Continue present medications.                           - Await pathology results. Milus Banister, MD 08/17/2019 11:30:07 AM This report has been signed electronically.

## 2019-08-17 NOTE — Progress Notes (Signed)
Called to room to assist during endoscopic procedure.  Patient ID and intended procedure confirmed with present staff. Received instructions for my participation in the procedure from the performing physician.  

## 2019-08-17 NOTE — Telephone Encounter (Signed)
Mr Brandes returned Dr. Ernestina Penna call with update on how he is doing after treatment.  He states his abdomen is a little swollen, he still as no appetite.  He denies nausea.  He is moving his bowels without difficulty.  He is drinking premier prt shakes.  I encouraged him to drink at least 3 per day.  He verbalized understanding.

## 2019-08-17 NOTE — Progress Notes (Signed)
Temp JB V/S CW 

## 2019-08-18 ENCOUNTER — Telehealth: Payer: Self-pay | Admitting: Hematology

## 2019-08-18 NOTE — Telephone Encounter (Signed)
R/s appt per 5/6 sch message - unable to reach pt . Left message for patient with appt date and time

## 2019-08-18 NOTE — Progress Notes (Signed)
Pharmacist Chemotherapy Monitoring - Follow Up Assessment    I verify that I have reviewed each item in the below checklist:  . Regimen for the patient is scheduled for the appropriate day and plan matches scheduled date. Marland Kitchen Appropriate non-routine labs are ordered dependent on drug ordered. . If applicable, additional medications reviewed and ordered per protocol based on lifetime cumulative doses and/or treatment regimen.   Plan for follow-up and/or issues identified: Yes . I-vent associated with next due treatment: Yes . MD and/or nursing notified: No  Britt Boozer 08/18/2019 3:10 PM

## 2019-08-19 ENCOUNTER — Telehealth: Payer: Self-pay

## 2019-08-19 NOTE — Telephone Encounter (Signed)
  Follow up Call-  Call back number 08/17/2019 12/08/2017  Post procedure Call Back phone  # [phone]  Permission to leave phone message Yes Yes  Some recent data might be hidden     Patient questions:  Do you have a fever, pain , or abdominal swelling? No. Pain Score  0 *  Have you tolerated food without any problems? Yes.    Have you been able to return to your normal activities? Yes.    Do you have any questions about your discharge instructions: Diet   No. Medications  No. Follow up visit  No.  Do you have questions or concerns about your Care? No.  Actions: * If pain score is 4 or above: No action needed, pain <4. 1. Have you developed a fever since your procedure? no  2.   Have you had an respiratory symptoms (SOB or cough) since your procedure? no  3.   Have you tested positive for COVID 19 since your procedure no  4.   Have you had any family members/close contacts diagnosed with the COVID 19 since your procedure?  no   If yes to any of these questions please route to Joylene John, RN and Erenest Rasher, RN

## 2019-08-22 ENCOUNTER — Other Ambulatory Visit: Payer: Self-pay

## 2019-08-22 MED ORDER — CLARITHROMYCIN 500 MG PO TABS: 500.0000 mg | ORAL_TABLET | Freq: Two times a day (BID) | ORAL | 0 refills | Status: AC

## 2019-08-22 MED ORDER — AMOXICILLIN 500 MG PO TABS: 1000.0000 mg | ORAL_TABLET | Freq: Two times a day (BID) | ORAL | 0 refills | Status: AC

## 2019-08-24 ENCOUNTER — Other Ambulatory Visit: Payer: Medicare HMO

## 2019-08-24 ENCOUNTER — Ambulatory Visit: Payer: Medicare HMO | Admitting: Hematology

## 2019-08-24 ENCOUNTER — Telehealth: Payer: Self-pay

## 2019-08-24 ENCOUNTER — Ambulatory Visit: Payer: Medicare HMO

## 2019-08-24 NOTE — Telephone Encounter (Signed)
Mrs. Guereca left vm stating that her husband was sick in bed with a "bacterial infection in the stomach" and will not be able to come to his appts today.  The appts today had been rescheduled.  I returned the call to both Mr and Mrs [name] but neither answered the phone.  I left a vm for Mr. Ostertag to call me.

## 2019-09-01 ENCOUNTER — Other Ambulatory Visit: Payer: Self-pay | Admitting: Interventional Radiology

## 2019-09-01 DIAGNOSIS — C220 Liver cell carcinoma: Secondary | ICD-10-CM

## 2019-09-09 ENCOUNTER — Telehealth: Payer: Self-pay

## 2019-09-09 ENCOUNTER — Ambulatory Visit: Payer: Medicare HMO

## 2019-09-09 ENCOUNTER — Other Ambulatory Visit: Payer: Medicare HMO

## 2019-09-09 ENCOUNTER — Ambulatory Visit: Payer: Medicare HMO | Admitting: Hematology

## 2019-09-09 NOTE — Telephone Encounter (Signed)
I left a vm on Mrs Ringold's phone stating that Mr Pooler had an appt this am.  I asked if he was ok and requested she return my call.

## 2019-09-13 ENCOUNTER — Telehealth: Payer: Self-pay

## 2019-09-13 NOTE — Telephone Encounter (Signed)
I left detailed vm for Mrs Rutten regarding Mr Ciszewski's upcoming appts.  I requested she return my call.

## 2019-09-13 NOTE — Telephone Encounter (Signed)
Mrs. Deschepper returned my call and confirmed Mr Fruchter's upcoming appointments

## 2019-09-15 ENCOUNTER — Inpatient Hospital Stay: Payer: Medicare HMO

## 2019-09-15 ENCOUNTER — Inpatient Hospital Stay: Payer: Medicare HMO | Admitting: Nurse Practitioner

## 2019-09-15 ENCOUNTER — Inpatient Hospital Stay: Payer: Medicare HMO | Attending: Hematology

## 2019-09-15 ENCOUNTER — Encounter: Payer: Self-pay | Admitting: Nurse Practitioner

## 2019-09-15 ENCOUNTER — Other Ambulatory Visit: Payer: Self-pay

## 2019-09-15 VITALS — BP 115/78 | HR 109 | Temp 97.7°F | Resp 17 | Ht 67.0 in | Wt 129.3 lb

## 2019-09-15 DIAGNOSIS — R630 Anorexia: Secondary | ICD-10-CM | POA: Diagnosis not present

## 2019-09-15 DIAGNOSIS — R000 Tachycardia, unspecified: Secondary | ICD-10-CM | POA: Diagnosis not present

## 2019-09-15 DIAGNOSIS — R634 Abnormal weight loss: Secondary | ICD-10-CM | POA: Insufficient documentation

## 2019-09-15 DIAGNOSIS — K5900 Constipation, unspecified: Secondary | ICD-10-CM | POA: Insufficient documentation

## 2019-09-15 DIAGNOSIS — J449 Chronic obstructive pulmonary disease, unspecified: Secondary | ICD-10-CM | POA: Insufficient documentation

## 2019-09-15 DIAGNOSIS — C220 Liver cell carcinoma: Secondary | ICD-10-CM

## 2019-09-15 DIAGNOSIS — R5383 Other fatigue: Secondary | ICD-10-CM | POA: Insufficient documentation

## 2019-09-15 DIAGNOSIS — Z79899 Other long term (current) drug therapy: Secondary | ICD-10-CM | POA: Insufficient documentation

## 2019-09-15 DIAGNOSIS — K7460 Unspecified cirrhosis of liver: Secondary | ICD-10-CM | POA: Diagnosis not present

## 2019-09-15 DIAGNOSIS — R1030 Lower abdominal pain, unspecified: Secondary | ICD-10-CM | POA: Insufficient documentation

## 2019-09-15 DIAGNOSIS — Z66 Do not resuscitate: Secondary | ICD-10-CM | POA: Insufficient documentation

## 2019-09-15 DIAGNOSIS — B182 Chronic viral hepatitis C: Secondary | ICD-10-CM | POA: Diagnosis not present

## 2019-09-15 DIAGNOSIS — R42 Dizziness and giddiness: Secondary | ICD-10-CM | POA: Diagnosis not present

## 2019-09-15 DIAGNOSIS — R188 Other ascites: Secondary | ICD-10-CM | POA: Insufficient documentation

## 2019-09-15 LAB — CBC WITH DIFFERENTIAL (CANCER CENTER ONLY)
Abs Immature Granulocytes: 0.02 10*3/uL (ref 0.00–0.07)
Basophils Absolute: 0 10*3/uL (ref 0.0–0.1)
Basophils Relative: 1 %
Eosinophils Absolute: 0.1 10*3/uL (ref 0.0–0.5)
Eosinophils Relative: 1 %
HCT: 33.2 % — ABNORMAL LOW (ref 39.0–52.0)
Hemoglobin: 12.2 g/dL — ABNORMAL LOW (ref 13.0–17.0)
Immature Granulocytes: 0 %
Lymphocytes Relative: 21 %
Lymphs Abs: 1.1 10*3/uL (ref 0.7–4.0)
MCH: 38.5 pg — ABNORMAL HIGH (ref 26.0–34.0)
MCHC: 36.7 g/dL — ABNORMAL HIGH (ref 30.0–36.0)
MCV: 104.7 fL — ABNORMAL HIGH (ref 80.0–100.0)
Monocytes Absolute: 0.5 10*3/uL (ref 0.1–1.0)
Monocytes Relative: 11 %
Neutro Abs: 3.3 10*3/uL (ref 1.7–7.7)
Neutrophils Relative %: 66 %
Platelet Count: 58 10*3/uL — ABNORMAL LOW (ref 150–400)
RBC: 3.17 MIL/uL — ABNORMAL LOW (ref 4.22–5.81)
RDW: 15.1 % (ref 11.5–15.5)
WBC Count: 5 10*3/uL (ref 4.0–10.5)
nRBC: 0.4 % — ABNORMAL HIGH (ref 0.0–0.2)

## 2019-09-15 LAB — AMMONIA: Ammonia: 120 umol/L — ABNORMAL HIGH (ref 9–35)

## 2019-09-15 LAB — CMP (CANCER CENTER ONLY)
ALT: 31 U/L (ref 0–44)
AST: 56 U/L — ABNORMAL HIGH (ref 15–41)
Albumin: 1.8 g/dL — ABNORMAL LOW (ref 3.5–5.0)
Alkaline Phosphatase: 125 U/L (ref 38–126)
Anion gap: 14 (ref 5–15)
BUN: 9 mg/dL (ref 8–23)
CO2: 19 mmol/L — ABNORMAL LOW (ref 22–32)
Calcium: 8.1 mg/dL — ABNORMAL LOW (ref 8.9–10.3)
Chloride: 101 mmol/L (ref 98–111)
Creatinine: 1.03 mg/dL (ref 0.61–1.24)
GFR, Est AFR Am: >60 mL/min (ref >60)
GFR, Estimated: >60 mL/min (ref >60)
Glucose, Bld: 127 mg/dL — ABNORMAL HIGH (ref 70–99)
Potassium: 3.2 mmol/L — ABNORMAL LOW (ref 3.5–5.1)
Sodium: 134 mmol/L — ABNORMAL LOW (ref 135–145)
Total Bilirubin: 5.9 mg/dL (ref 0.3–1.2)
Total Protein: 7.9 g/dL (ref 6.5–8.1)

## 2019-09-15 LAB — TSH: TSH: 3.697 u[IU]/mL (ref 0.320–4.118)

## 2019-09-15 LAB — TOTAL PROTEIN, URINE DIPSTICK: Protein, ur: NEGATIVE mg/dL

## 2019-09-15 MED ORDER — SODIUM CHLORIDE 0.9 % IV SOLN
Freq: Once | INTRAVENOUS | Status: AC
  Filled 2019-09-15: qty 250

## 2019-09-15 MED ORDER — LACTULOSE 10 GM/15ML PO SOLN: 20.0000 g | Freq: Four times a day (QID) | ORAL | 1 refills | Status: AC | PRN

## 2019-09-15 NOTE — Patient Instructions (Signed)
You are having an abdominal ultrasound and then a paracentesis tomorrow (09/16/19) at [facility] on [facility]. Arrive at 0945 for 10:00 am procedures. Nothing to eat or drink after midnight.

## 2019-09-15 NOTE — Progress Notes (Signed)
No treatment today per NP Lacie, pt to receive IVF NS over 2 hours.

## 2019-09-15 NOTE — Progress Notes (Signed)
Notified patient of abdominal US and IR paracentesis at [facility] tomorrow at 0945. He will be NPO after midnight. Provided information verbally and in writing.

## 2019-09-15 NOTE — Progress Notes (Signed)
[place]   [phone] Fax:([phone]) [phone]   Clinic Follow up Note   Patient Care Team: [name], NP-C as PCP - General (Family Medicine) Milus Banister, MD as Attending Physician (Gastroenterology) Ilean China as Physician Assistant (Cardiology) Prescott Gum, Collier Salina, MD as Consulting Physician (Cardiothoracic Surgery) Greggory Keen, MD as Consulting Physician (Interventional Radiology) Tania Ade, Tomasa Blase, RN as [facility] Management 09/15/2019  CHIEF COMPLAINT: F/u [place]  SUMMARY OF ONCOLOGIC HISTORY: Oncology History Overview Note  Cancer Staging Hepatocellular carcinoma [facility]) Staging form: Liver, AJCC 8th Edition - Clinical stage from 07/25/2017: Stage IIIA (cT3, cN0, cM0) - Signed by Truitt Merle, MD on 08/26/2017     Hepatocellular carcinoma ([place])  07/2017 Tumor Marker   AFP: 2431   07/15/2017 Imaging   IMPRESSION: ULTRASOUND ABDOMEN: Multiple hypoechoic liver masses, suspicious for multifocal hepatoma or hepatic metastatic disease. Abdomen MRI without and with contrast is recommended for further characterization. Cholelithiasis. No sonographic signs of acute cholecystitis or biliary dilatation. These results will be called to the ordering clinician or representative by the Radiologist Assistant, and communication documented in the PACS or zVision Dashboard.  ULTRASOUND HEPATIC ELASTOGRAPHY: Median hepatic shear wave velocity is calculated at 2.52 m/sec. Corresponding Metavir fibrosis score is Some F3 + F4. Risk of fibrosis is High. Follow-up: Follow up advised   07/25/2017 Imaging   IMPRESSION: At least 4 separate hepatic masses with characteristics of multifocal hepatoma, largest measuring 5.5 cm. No evidence of abdominal metastatic disease. Cholelithiasis.  No radiographic evidence of cholecystitis. Incidentally noted congenital small bowel malrotation. No evidence of volvulus or bowel  obstruction.    07/25/2017 Cancer Staging   Staging form: Liver, AJCC 8th Edition - Clinical stage from 07/25/2017: Stage IIIA (cT3, cN0, cM0) - Signed by Truitt Merle, MD on 08/26/2017   08/25/2017 Initial Diagnosis   Hepatocellular carcinoma ([place])   11/06/2017 Procedure   peripheral right hepatic artery Y 90 radio embolization   12/04/2017 Procedure   peripheral left hepatic Y 90 radio embolization   12/21/2017 Imaging   CT Chest W Contrast 12/21/17  IMPRESSION: 1. 4.8 x 5.9 x 6.7 cm heterogeneously enhancing hepatic mass compatible with the reported clinical history of hepatocellular carcinoma. No definite metastatic disease noted in the thorax, although there is a slightly prominent anterior mediastinal lymph node measuring 7 mm in short axis in the juxta pericardiac nodal station. This is nonspecific but warrants attention on follow-up studies. 2. Mild diffuse bronchial wall thickening with mild centrilobular and moderate paraseptal emphysema; imaging findings suggestive of underlying COPD. 3. In addition, there is evidence of probable interstitial lung disease in the lung bases. Outpatient referral to Pulmonology for further evaluation could be considered if clinically appropriate. 4. Aortic atherosclerosis, in addition to left main and 3 vessel coronary artery disease. Status post median sternotomy for CABG including LIMA to the LAD. Aortic Atherosclerosis (ICD10-I70.0) and Emphysema (ICD10-J43.9).   04/20/2018 Imaging   MRI Abdomen  IMPRESSION: 1. Mixed response to therapy. Dominant segment 4A left liver lobe tumor is decreased in size but demonstrates persistent thick peripheral enhancement indicating viable tumor. Inferior right liver lobe 3.0 cm mass is increased in size and demonstrates persistent avid arterial enhancement indicating viable tumor. Two additional smaller liver masses have decreased in size. One new subcentimeter segment 3 left liver lobe mass suspicious for  new site of hepatocellular carcinoma. 2. Hepatic cirrhosis.  Normal size spleen.  No ascites. 3. No abdominal lymphadenopathy. 4. Cholelithiasis.   05/06/2018  Procedure   05/06/18 right hepatic bland embolization by Dr. Annamaria Boots    11/04/2018 Imaging   MRI Abdomen 11/04/18  IMPRESSION: 1. Stable cirrhotic changes involving the liver along with fairly diffuse fatty infiltration. 2. Interval decrease in size of the segment 4B liver lesion when compared to prior studies. There is a persistent rim of enhancement around the lesion but I do not see any nodularity or mass effect to suggest this is tumor. It certainly could be enhancing granulation tissue or inflammatory change. Recommend continued surveillance. 3. Slight interval enlargement of two segment III early arterial phase enhancing nodules which could be dysplastic nodules or small HCC's. A third nodule in segment 4B is stable at 7 mm. 4. Cholelithiasis without sonographic findings for acute cholecystitis. Normal common bile duct. 5. No abdominal lymphadenopathy.   06/20/2019 Procedure   Y90 embolization on 06/20/19 with IR Dr Annamaria Boots    07/12/2019 Imaging   CT CAP W Contrast IMPRESSION: 1. No CT evidence for acute intrathoracic abnormality.  Emphysema. 2. Grossly abnormal appearing liver with heterogenous fatty infiltration/underlying liver disease and multiple liver masses as noted on MRI from 2021 and corresponding to history of hepatocellular carcinoma. 3. Gallstones 4. Diffuse diverticular disease of the colon without convincing evidence for acute inflammatory process   08/11/2019 -  Chemotherapy   Tecentriq and avastin q3weeks starting 08/11/19. First cycle with Tecentriq alone.       CURRENT THERAPY: Atezolizumab given with cycle 1 starting 08/11/19, s/p EGD negative for varices; plan to add avastin will be added with cycle 2 on 09/15/19  INTERVAL HISTORY: Zachary Steele returns for f/u and treatment. He completed cycle 1  atezolizumab on 08/11/19. He underwent EGD on 08/17/19 by Dr. Ardis Hughs which did not show gastric or esophageal varices. He missed his appointment last week. He presents alone. He reports sleeping "constantly" lately and feels little confused or disoriented at times. After tecentriq his abdomen became bloated and hard, still bloated but softer now without pain. Takes rare NSAID and does not use tylenol. He has a sip of wine now and then. No n/v/d. He is eating a little more, having small bowel movements. He has occasional dizziness, close fall, and exertional dyspnea. No cough, chest pain, fever, chills.    MEDICAL HISTORY:  Past Medical History:  Diagnosis Date  . Alcohol dependence ([place])   . Anemia    denies  . Arthritis   . Cataract   . Cirrhosis ([place])   . COPD (chronic obstructive pulmonary disease) ([place])    "I think I heard the Dr say I have COPD"  . Coronary artery disease due to lipid rich plaque   . Depression   . Dyspnea   . GERD (gastroesophageal reflux disease)   . Hepatitis C    Has A and B also  . PAD (peripheral artery disease) ([place])    pt reports he has this and has chronic leg "soreness"  . Peripheral vascular disease ([place])    essentially normal circulations with two-vessel  runoff to the feet bilaterally  . Tobacco abuse     SURGICAL HISTORY: Past Surgical History:  Procedure Laterality Date  . CARDIAC CATHETERIZATION    . COLONOSCOPY    . CORONARY ARTERY BYPASS GRAFT  2010   Triple bypass at [facility]  . DOPPLER ECHOCARDIOGRAPHY  2011  . IR ANGIOGRAM SELECTIVE EACH ADDITIONAL VESSEL  10/27/2017  . IR ANGIOGRAM SELECTIVE EACH ADDITIONAL VESSEL  10/27/2017  . IR ANGIOGRAM SELECTIVE EACH ADDITIONAL VESSEL  10/27/2017  .  IR ANGIOGRAM SELECTIVE EACH ADDITIONAL VESSEL  10/27/2017  . IR ANGIOGRAM SELECTIVE EACH ADDITIONAL VESSEL  10/27/2017  . IR ANGIOGRAM SELECTIVE EACH ADDITIONAL VESSEL  10/27/2017  . IR ANGIOGRAM SELECTIVE EACH ADDITIONAL VESSEL  10/27/2017  . IR ANGIOGRAM  SELECTIVE EACH ADDITIONAL VESSEL  11/06/2017  . IR ANGIOGRAM SELECTIVE EACH ADDITIONAL VESSEL  11/06/2017  . IR ANGIOGRAM SELECTIVE EACH ADDITIONAL VESSEL  11/06/2017  . IR ANGIOGRAM SELECTIVE EACH ADDITIONAL VESSEL  12/04/2017  . IR ANGIOGRAM SELECTIVE EACH ADDITIONAL VESSEL  12/04/2017  . IR ANGIOGRAM SELECTIVE EACH ADDITIONAL VESSEL  05/06/2018  . IR ANGIOGRAM SELECTIVE EACH ADDITIONAL VESSEL  05/06/2018  . IR ANGIOGRAM SELECTIVE EACH ADDITIONAL VESSEL  05/06/2018  . IR ANGIOGRAM SELECTIVE EACH ADDITIONAL VESSEL  05/06/2018  . IR ANGIOGRAM SELECTIVE EACH ADDITIONAL VESSEL  06/07/2019  . IR ANGIOGRAM SELECTIVE EACH ADDITIONAL VESSEL  06/07/2019  . IR ANGIOGRAM SELECTIVE EACH ADDITIONAL VESSEL  06/07/2019  . IR ANGIOGRAM SELECTIVE EACH ADDITIONAL VESSEL  06/20/2019  . IR ANGIOGRAM SELECTIVE EACH ADDITIONAL VESSEL  06/20/2019  . IR ANGIOGRAM VISCERAL SELECTIVE  10/27/2017  . IR ANGIOGRAM VISCERAL SELECTIVE  11/06/2017  . IR ANGIOGRAM VISCERAL SELECTIVE  12/04/2017  . IR ANGIOGRAM VISCERAL SELECTIVE  05/06/2018  . IR ANGIOGRAM VISCERAL SELECTIVE  06/07/2019  . IR ANGIOGRAM VISCERAL SELECTIVE  06/20/2019  . IR EMBO ARTERIAL NOT HEMORR HEMANG INC GUIDE ROADMAPPING  10/27/2017  . IR EMBO TUMOR ORGAN ISCHEMIA INFARCT INC GUIDE ROADMAPPING  11/06/2017  . IR EMBO TUMOR ORGAN ISCHEMIA INFARCT INC GUIDE ROADMAPPING  12/04/2017  . IR EMBO TUMOR ORGAN ISCHEMIA INFARCT INC GUIDE ROADMAPPING  05/06/2018  . IR EMBO TUMOR ORGAN ISCHEMIA INFARCT INC GUIDE ROADMAPPING  06/20/2019  . IR RADIOLOGIST EVAL & MGMT  08/12/2017  . IR RADIOLOGIST EVAL & MGMT  09/22/2017  . IR RADIOLOGIST EVAL & MGMT  01/07/2018  . IR RADIOLOGIST EVAL & MGMT  04/20/2018  . IR RADIOLOGIST EVAL & MGMT  11/18/2018  . IR RADIOLOGIST EVAL & MGMT  05/12/2019  . IR RADIOLOGIST EVAL & MGMT  07/12/2019  . IR US GUIDE VASC ACCESS RIGHT  10/27/2017  . IR US GUIDE VASC ACCESS RIGHT  11/06/2017  . IR US GUIDE VASC ACCESS RIGHT  12/04/2017  . IR US GUIDE VASC ACCESS RIGHT   05/06/2018  . IR US GUIDE VASC ACCESS RIGHT  06/07/2019  . IR US GUIDE VASC ACCESS RIGHT  06/20/2019  . NM MYOVIEW LTD  2012    I have reviewed the social history and family history with the patient and they are unchanged from previous note.  ALLERGIES:  is allergic to statins.  MEDICATIONS:  Current Outpatient Medications  Medication Sig Dispense Refill  . aspirin 81 MG tablet Take 81 mg by mouth daily.    . Cholecalciferol (VITAMIN D3 PO) Take 125 mcg by mouth daily.    Marland Kitchen dexlansoprazole (DEXILANT) 60 MG capsule Take 1 capsule (60 mg total) by mouth daily. 15 capsule 0  . Multiple Vitamin (MULTIVITAMIN WITH MINERALS) TABS tablet Take 1 tablet by mouth daily.    . Turmeric (QC TUMERIC COMPLEX) 500 MG CAPS Take by mouth daily.    Marland Kitchen zinc gluconate 50 MG tablet Take 50 mg by mouth daily.    Marland Kitchen albuterol (PROVENTIL HFA;VENTOLIN HFA) 108 (90 Base) MCG/ACT inhaler Inhale 2 puffs into the lungs every 6 (six) hours as needed for wheezing or shortness of breath. (Patient not taking: Reported on 09/15/2019) 1 Inhaler 6  . lactulose ([place]) 10  GM/15ML solution Take 30 mLs (20 g total) by mouth 4 (four) times daily as needed. 236 mL 1  . LORazepam (ATIVAN) 0.5 MG tablet Take 1 tablet (0.5 mg) approximately 30 minutes prior to MRI. If still anxious, may take additional 1 tablet (0.5 mg). (Patient not taking: Reported on 09/15/2019) 2 tablet 0  . mirtazapine (REMERON) 7.5 MG tablet Take 1 tablet (7.5 mg total) by mouth at bedtime. (Patient not taking: Reported on 09/15/2019) 30 tablet 2  . mometasone-formoterol (DULERA) 200-5 MCG/ACT AERO Inhale 2 puffs into the lungs 2 (two) times daily.    . ondansetron (ZOFRAN) 8 MG tablet Take 1 tablet (8 mg total) by mouth every 8 (eight) hours as needed for nausea or vomiting. (Patient not taking: Reported on 09/15/2019) 20 tablet 0  . pantoprazole (PROTONIX) 40 MG tablet 40 mg one tab 30 minutes before breakfast meal (Patient not taking: Reported on 09/15/2019) 90 tablet 3   . tiotropium (SPIRIVA) 18 MCG inhalation capsule Place 1 capsule (18 mcg total) into inhaler and inhale daily. (Patient not taking: Reported on 09/15/2019) 30 capsule 6   No current facility-administered medications for this visit.    PHYSICAL EXAMINATION: ECOG PERFORMANCE STATUS: 3 - Symptomatic, >50% confined to bed  Vitals:   09/15/19 1322  BP: 115/78  Pulse: (!) 109  Resp: 17  Temp: 97.7 F (36.5 C)  SpO2: 100%   Filed Weights   09/15/19 1322  Weight: 129 lb 4.8 oz (58.7 kg)    GENERAL:awake but drowsy, no distress and comfortable SKIN: no rash  EYES: sclera clear LUNGS: clearwith normal breathing effort HEART: regular rate & rhythm, no lower extremity edema ABDOMEN:abdomen soft, distended consistent with ascites, non-tender and normal bowel sounds.  NEURO: alert & oriented x 3 with fluent speech, normal gait   LABORATORY DATA:  I have reviewed the data as listed CBC Latest Ref Rng & Units 09/15/2019 08/03/2019 07/12/2019  WBC 4.0 - 10.5 K/uL 5.0 4.2 4.0  Hemoglobin 13.0 - 17.0 g/dL 12.2(L) 12.8(L) 13.3  Hematocrit 39.0 - 52.0 % 33.2(L) 36.5(L) 35.9(L)  Platelets 150 - 400 K/uL 58(L) 53(L) 60(L)     CMP Latest Ref Rng & Units 09/15/2019 08/03/2019 07/12/2019  Glucose 70 - 99 mg/dL 127(H) 107(H) 94  BUN 8 - 23 mg/dL 9 <4(L) <5(L)  Creatinine 0.61 - 1.24 mg/dL 1.03 0.73 0.63  Sodium 135 - 145 mmol/L 134(L) 140 138  Potassium 3.5 - 5.1 mmol/L 3.2(L) 3.7 3.9  Chloride 98 - 111 mmol/L 101 106 105  CO2 22 - 32 mmol/L 19(L) 25 19(L)  Calcium 8.9 - 10.3 mg/dL 8.1(L) 8.1(L) 8.3(L)  Total Protein 6.5 - 8.1 g/dL 7.9 7.4 7.0  Total Bilirubin 0.3 - 1.2 mg/dL 5.9(HH) 3.0(H) 2.8(H)  Alkaline Phos 38 - 126 U/L 125 129(H) 98  AST 15 - 41 U/L 56(H) 86(H) 58(H)  ALT 0 - 44 U/L 31 36 23      RADIOGRAPHIC STUDIES: I have personally reviewed the radiological images as listed and agreed with the findings in the report. No results found.   ASSESSMENT & PLAN: Zachary Steele is a 69  y.o. male with    1. Hepatocellular Carcinoma, multifocal, cT3N0Mx -He was diagnosed in 08/2017 given he has at least 4 separate hepatic masses with the largest measuring 5.5 cm with typical imaging features of HCC. AFP is elevated to 2431;this is diagnostic forhepatocellular carcinoma. -Due to the size and multifocal disease he is not a candidate for liver transplant or resection.His  liver cancer is not curableat this stage, but still treatable. -He underwentbilateral hepatic arteryY90 treatments on 11/06/17 and 12/04/17 with Dr. Annamaria Boots. He tolerated the procedure very well but had mixed response. -On 05/06/18 he underwent right hepatic bland embolization by Dr. Annamaria Boots. He was on Observation since. -His1/11/2021liver MRIshoweddisease progression with new lesions in the left lobe,stable disease in the right lobe. No extrahepatic notable distant metastasis.  -He underwent Y90 embolization to left lobe lvier on 06/20/19 with IR Dr Annamaria Boots  -His CT scan from 07/12/19 showed overall stable disease with some mild improvement in multifocal liver cancer.  His response to latest Y90 may not last much longer.  -Dr. Burr Medico recommended systemic therapy with IV Immunotherapy Tecentriq and biological agent bevacizumab q3weeks as first line treatment, based on the IMbrave 150 trial data.  -S/p cycle 1 tecentriq on 08/11/19, he underwent EGD on 08/17/19 which was negative for varices and the plan was to add avastin with cycle 2 -Mr. Felton appears weak with low PS and progressive liver failure. Labs c/w hepatic encephalopathy, ammonia 120. I prescribed lactulose and reviewed dosing with the patient and later with his wife on the phone. They understand.   -physical exam shows distended abdomen consistent with ascites.  -He is being referred for abdominal US to evaluate the liver for ductal dilation in the event we can correct his poor liver function with stenting if possible. He will also have paracentesis on  09/16/19.  -He is not a candidate for treatment today given his overall poor condition. He will receive IV hydration.  -I encouraged him to improve nutrition, hydration, and physical condition over the next week  -repeat lab and f/u next week, I encouraged him wife or family member to come. -Plan reviewed with Dr. Burr Medico.    2. Fatigue, Taste change, Lower eating and weight loss  -Has been ongoing for the past in the past few months.  -He has not been able to return to work since [place] due to fatigue.  -He has adequate appetite but after 1 bite he will not want to eat more. His weight is trending down. -Based on his MRI symptoms are likely related to disease progression but s/p recent Y90 his symptoms have worsened.  -He also notes years of occasional dizziness and near syncope. I encouraged him to drink more and ambulate with caution -He will continue nutritional supplement. Started mirtazapine in 07/2019  -he has been sleeping more over the last month, low energy, overall low PS  3. 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   -We recommend he continue to abstain from alcohol to prevent further liver damage and try to quit smoking. -Repeat EGD 08/17/19 by Dr. Ardis Hughs shows no gastric or esophageal varices, plan to add Avastin with cycle 2 today, but given his poor PS and declining liver function, treatment has been held today -Biopsy showed H.pylori. per patient and his wife he completed therapy   4. COPD, Smoking Cessation, Cough -inhalers per PCP -Not oxygen dependent   5. Goal of care discussion  -treatment goal is palliative -full code    PLAN: -Labs reviewed -Hold treatment -1 L NS over 2 hours -Rx: Lactulose 4 x daily PRN to maintain  loose stools  -Increase hydration, nutrition, strengthening -ABD [place] and paracentesis 09/16/19 at 10  AM at [facility], patient and wife aware  -Repeat lab and f/u next week   No problem-specific Assessment & Plan notes found for this encounter.   Orders Placed This Encounter  Procedures  . US Abdomen Complete    Standing Status:   Future    Standing Expiration Date:   09/14/2020    Order Specific Question:   Reason for Exam (SYMPTOM  OR DIAGNOSIS REQUIRED)    Answer:   [place], ascites hyperbilirubinemia, evaluate obstruction/ductal dilatation    Order Specific Question:   Preferred imaging location?    Answer:   [facility]  . IR Paracentesis    Standing Status:   Future    Standing Expiration Date:   09/14/2020    Order Specific Question:   If therapeutic, is there a maximum amount of fluid to be removed?    Answer:   Yes    Order Specific Question:   What is the maximum amount of fluide to be removed?    Answer:   3 L    Order Specific Question:   Are labs required for specimen collection?    Answer:   Yes    Order Specific Question:   Lab orders requested (DO NOT place separate lab orders, these will be automatically ordered during procedure specimen collection):    Answer:   Cytology - Non Pap    Order Specific Question:   Is Albumin medication needed?    Answer:   No    Order Specific Question:   Reason for Exam (SYMPTOM  OR DIAGNOSIS REQUIRED)    Answer:   [place] with ascites, diagnostic and therapeutic    Order Specific Question:   Preferred Imaging Location?    Answer:   [facility]  . Ammonia    Standing Status:   Future    Number of Occurrences:   1    Standing Expiration Date:   09/14/2020   All questions were answered. The patient knows to call the clinic with any problems, questions or concerns. No barriers to learning was detected. Total encounter time was 35 minutes.      Alla Feeling, NP 09/15/19

## 2019-09-16 ENCOUNTER — Telehealth: Payer: Self-pay | Admitting: Hematology

## 2019-09-16 ENCOUNTER — Ambulatory Visit (HOSPITAL_COMMUNITY)
Admission: RE | Admit: 2019-09-16 | Discharge: 2019-09-16 | Disposition: A | Discharge status: Home / Self Care | Payer: Medicare HMO | Source: Ambulatory Visit | Attending: Nurse Practitioner | Admitting: Nurse Practitioner

## 2019-09-16 ENCOUNTER — Telehealth: Payer: Self-pay | Admitting: Nurse Practitioner

## 2019-09-16 DIAGNOSIS — R188 Other ascites: Secondary | ICD-10-CM | POA: Diagnosis not present

## 2019-09-16 DIAGNOSIS — K8020 Calculus of gallbladder without cholecystitis without obstruction: Secondary | ICD-10-CM | POA: Diagnosis not present

## 2019-09-16 DIAGNOSIS — C220 Liver cell carcinoma: Secondary | ICD-10-CM

## 2019-09-16 DIAGNOSIS — K769 Liver disease, unspecified: Secondary | ICD-10-CM | POA: Diagnosis not present

## 2019-09-16 HISTORY — PX: IR PARACENTESIS: IMG2679

## 2019-09-16 MED ORDER — LIDOCAINE HCL 1 % IJ SOLN
INTRAMUSCULAR | Status: DC | PRN
  Administered 2019-09-16: 10 mL

## 2019-09-16 MED ORDER — LIDOCAINE HCL 1 % IJ SOLN
INTRAMUSCULAR | Status: AC
  Filled 2019-09-16: qty 20

## 2019-09-16 NOTE — Telephone Encounter (Signed)
Scheduled appt per 6/3 los.  Left a vm of the appt date and time. 

## 2019-09-16 NOTE — Procedures (Signed)
PROCEDURE SUMMARY:  Successful US guided paracentesis from left lateral abdomen.  Yielded 3.0 liters of yellow fluid.  No immediate complications.  Pt tolerated well.   Specimen was sent for labs.  EBL < 3mL  Docia Barrier PA-C 09/16/2019 11:39 AM

## 2019-09-16 NOTE — Telephone Encounter (Signed)
I reviewed pt's [place] today and called pt's wife, discussed the findings with his wife.  Ultrasound showed no bile duct obstruction for signs of acute cholecystitis, no intervention needed.  I explained to his wife that his worsening liver function is likely related to his liver cancer progression, or secondary to treatment.  We will see him back next week.  She voiced good understanding, and appreciated call.  Truitt Merle  09/16/2019

## 2019-09-19 ENCOUNTER — Other Ambulatory Visit: Payer: Self-pay

## 2019-09-19 LAB — CYTOLOGY - NON PAP

## 2019-09-19 NOTE — Patient Outreach (Signed)
[facility]) Care Management  09/19/2019  ZIV WELCHEL 1951-03-30 [id number]   Telephone Assessment     Outreach attempt to patient. No answer.     Plan: RN CM will make outreach attempt within the month of Sept.  Joss Friedel Verl [place] Management Telephonic Care Management Coordinator Direct Phone: [phone] Toll Free: [phone] Fax: [phone]

## 2019-09-22 ENCOUNTER — Ambulatory Visit: Payer: Self-pay

## 2019-09-23 ENCOUNTER — Inpatient Hospital Stay: Payer: Medicare HMO

## 2019-09-23 ENCOUNTER — Inpatient Hospital Stay: Payer: Medicare HMO | Admitting: Hematology

## 2019-09-27 ENCOUNTER — Telehealth: Payer: Self-pay

## 2019-09-27 NOTE — Telephone Encounter (Signed)
Left vm for Mr Guandique to return my call.

## 2019-09-28 ENCOUNTER — Telehealth: Payer: Self-pay

## 2019-09-28 NOTE — Progress Notes (Signed)
[place]   [phone] [phone]   Clinic Follow up Note   Patient Care Team: [name], NP-C as PCP - General (Family Medicine) Milus Banister, MD as Attending Physician (Gastroenterology) Ilean China as Physician Assistant (Cardiology) Prescott Gum, Collier Salina, MD as Consulting Physician (Cardiothoracic Surgery) Greggory Keen, MD as Consulting Physician (Interventional Radiology) Tania Ade, Tomasa Blase, RN as [place] Management  Date of Service:  09/30/2019  CHIEF COMPLAINT: f/u [place]  SUMMARY OF ONCOLOGIC HISTORY: Oncology History Overview Note  Cancer Staging Hepatocellular carcinoma [facility]) Staging form: Liver, AJCC 8th Edition - Clinical stage from 07/25/2017: Stage IIIA (cT3, cN0, cM0) - Signed by Truitt Merle, MD on 08/26/2017     Hepatocellular carcinoma ([place])  07/2017 Tumor Marker   AFP: 2431   07/15/2017 Imaging   IMPRESSION: ULTRASOUND ABDOMEN: Multiple hypoechoic liver masses, suspicious for multifocal hepatoma or hepatic metastatic disease. Abdomen MRI without and with contrast is recommended for further characterization. Cholelithiasis. No sonographic signs of acute cholecystitis or biliary dilatation. These results will be called to the ordering clinician or representative by the Radiologist Assistant, and communication documented in the PACS or zVision Dashboard.  ULTRASOUND HEPATIC ELASTOGRAPHY: Median hepatic shear wave velocity is calculated at 2.52 m/sec. Corresponding Metavir fibrosis score is Some F3 + F4. Risk of fibrosis is High. Follow-up: Follow up advised   07/25/2017 Imaging   IMPRESSION: At least 4 separate hepatic masses with characteristics of multifocal hepatoma, largest measuring 5.5 cm. No evidence of abdominal metastatic disease. Cholelithiasis.  No radiographic evidence of cholecystitis. Incidentally noted congenital small bowel malrotation. No evidence of volvulus  or bowel obstruction.    07/25/2017 Cancer Staging   Staging form: Liver, AJCC 8th Edition - Clinical stage from 07/25/2017: Stage IIIA (cT3, cN0, cM0) - Signed by Truitt Merle, MD on 08/26/2017   08/25/2017 Initial Diagnosis   Hepatocellular carcinoma ([place])   11/06/2017 Procedure   peripheral right hepatic artery Y 90 radio embolization   12/04/2017 Procedure   peripheral left hepatic Y 90 radio embolization   12/21/2017 Imaging   CT Chest W Contrast 12/21/17  IMPRESSION: 1. 4.8 x 5.9 x 6.7 cm heterogeneously enhancing hepatic mass compatible with the reported clinical history of hepatocellular carcinoma. No definite metastatic disease noted in the thorax, although there is a slightly prominent anterior mediastinal lymph node measuring 7 mm in short axis in the juxta pericardiac nodal station. This is nonspecific but warrants attention on follow-up studies. 2. Mild diffuse bronchial wall thickening with mild centrilobular and moderate paraseptal emphysema; imaging findings suggestive of underlying COPD. 3. In addition, there is evidence of probable interstitial lung disease in the lung bases. Outpatient referral to Pulmonology for further evaluation could be considered if clinically appropriate. 4. Aortic atherosclerosis, in addition to left main and 3 vessel coronary artery disease. Status post median sternotomy for CABG including LIMA to the LAD. Aortic Atherosclerosis (ICD10-I70.0) and Emphysema (ICD10-J43.9).   04/20/2018 Imaging   MRI Abdomen  IMPRESSION: 1. Mixed response to therapy. Dominant segment 4A left liver lobe tumor is decreased in size but demonstrates persistent thick peripheral enhancement indicating viable tumor. Inferior right liver lobe 3.0 cm mass is increased in size and demonstrates persistent avid arterial enhancement indicating viable tumor. Two additional smaller liver masses have decreased in size. One new subcentimeter segment 3 left liver lobe mass  suspicious for new site of hepatocellular carcinoma. 2. Hepatic cirrhosis.  Normal size spleen.  No ascites. 3. No abdominal lymphadenopathy.  4. Cholelithiasis.   05/06/2018 Procedure   05/06/18 right hepatic bland embolization by Dr. Annamaria Boots    11/04/2018 Imaging   MRI Abdomen 11/04/18  IMPRESSION: 1. Stable cirrhotic changes involving the liver along with fairly diffuse fatty infiltration. 2. Interval decrease in size of the segment 4B liver lesion when compared to prior studies. There is a persistent rim of enhancement around the lesion but I do not see any nodularity or mass effect to suggest this is tumor. It certainly could be enhancing granulation tissue or inflammatory change. Recommend continued surveillance. 3. Slight interval enlargement of two segment III early arterial phase enhancing nodules which could be dysplastic nodules or small HCC's. A third nodule in segment 4B is stable at 7 mm. 4. Cholelithiasis without sonographic findings for acute cholecystitis. Normal common bile duct. 5. No abdominal lymphadenopathy.   06/20/2019 Procedure   Y90 embolization on 06/20/19 with IR Dr Annamaria Boots    07/12/2019 Imaging   CT CAP W Contrast IMPRESSION: 1. No CT evidence for acute intrathoracic abnormality.  Emphysema. 2. Grossly abnormal appearing liver with heterogenous fatty infiltration/underlying liver disease and multiple liver masses as noted on MRI from 2021 and corresponding to history of hepatocellular carcinoma. 3. Gallstones 4. Diffuse diverticular disease of the colon without convincing evidence for acute inflammatory process   08/11/2019 -  Chemotherapy   Tecentriq and avastin q3weeks starting 08/11/19. First cycle with Tecentriq alone. Stopped after C1 due to worsening liver function.        CURRENT THERAPY:  Tecentriq and avastin q3weeks starting 08/11/19. First cycle with Tecentriq alone. Stopped after C1 due to worsening liver function.   INTERVAL HISTORY:    Zachary Steele is here for a follow up and treatment. He presents to the clinic with his wife. He notes he is not doing well this week. He has been taking lactulose. He notes he has small appetite and has lower abdominal pain and abdominal bloating. He notes with BM has lower abdominal pain and without BM he will have shooting bowel cramps. He notes slight constipation. He notes he only taking OTC pain medication ibuprofen 1 tab at a time. He notes his pain is 5/10 but constant. He is not able to do much at home. He is able to go to bathroom himself. He notes dizziness with standing. He notes he spends most of the time laying down. Laying flat on his back relieves some pain. He is drinking a lot fluids. He notes his children are aware of his condition.    REVIEW OF SYSTEMS:   Constitutional: Denies fevers, chills or abnormal weight loss (+) Dizziness upon standing.  Eyes: Denies blurriness of vision Ears, nose, mouth, throat, and face: Denies mucositis or sore throat Respiratory: Denies cough, dyspnea or wheezes Cardiovascular: Denies palpitation, chest discomfort or lower extremity swelling Gastrointestinal:  Denies nausea, heartburn or change in bowel habits (+) lower abdominal pain (+) Bowel cramps (+) mild constipation Skin: Denies abnormal skin rashes Lymphatics: Denies new lymphadenopathy or easy bruising Neurological:Denies numbness, tingling or new weaknesses Behavioral/Psych: Mood is stable, no new changes  All other systems were reviewed with the patient and are negative.  MEDICAL HISTORY:  Past Medical History:  Diagnosis Date   Alcohol dependence ([place])    Anemia    denies   Arthritis    Cataract    Cirrhosis ([place])    COPD (chronic obstructive pulmonary disease) ([place])    "I think I heard the Dr say I have COPD"   Coronary  artery disease due to lipid rich plaque    Depression    Dyspnea    GERD (gastroesophageal reflux disease)    Hepatitis C    Has A and B  also   PAD (peripheral artery disease) ([place])    pt reports he has this and has chronic leg "soreness"   Peripheral vascular disease ([place])    essentially normal circulations with two-vessel  runoff to the feet bilaterally   Tobacco abuse     SURGICAL HISTORY: Past Surgical History:  Procedure Laterality Date   CARDIAC CATHETERIZATION     COLONOSCOPY     CORONARY ARTERY BYPASS GRAFT  2010   Triple bypass at [place]  10/27/2017   IR [place]  10/27/2017   IR ANGIOGRAM SELECTIVE EACH ADDITIONAL VESSEL  10/27/2017   IR ANGIOGRAM SELECTIVE EACH ADDITIONAL VESSEL  10/27/2017   IR ANGIOGRAM SELECTIVE EACH ADDITIONAL VESSEL  10/27/2017   IR ANGIOGRAM SELECTIVE EACH ADDITIONAL VESSEL  10/27/2017   IR ANGIOGRAM SELECTIVE EACH ADDITIONAL VESSEL  10/27/2017   IR ANGIOGRAM SELECTIVE EACH ADDITIONAL VESSEL  11/06/2017   IR ANGIOGRAM SELECTIVE EACH ADDITIONAL VESSEL  11/06/2017   IR ANGIOGRAM SELECTIVE EACH ADDITIONAL VESSEL  11/06/2017   IR ANGIOGRAM SELECTIVE EACH ADDITIONAL VESSEL  12/04/2017   IR ANGIOGRAM SELECTIVE EACH ADDITIONAL VESSEL  12/04/2017   IR ANGIOGRAM SELECTIVE EACH ADDITIONAL VESSEL  05/06/2018   IR ANGIOGRAM SELECTIVE EACH ADDITIONAL VESSEL  05/06/2018   IR ANGIOGRAM SELECTIVE EACH ADDITIONAL VESSEL  05/06/2018   IR ANGIOGRAM SELECTIVE EACH ADDITIONAL VESSEL  05/06/2018   IR ANGIOGRAM SELECTIVE EACH ADDITIONAL VESSEL  06/07/2019   IR ANGIOGRAM SELECTIVE EACH ADDITIONAL VESSEL  06/07/2019   IR ANGIOGRAM SELECTIVE EACH ADDITIONAL VESSEL  06/07/2019   IR ANGIOGRAM SELECTIVE EACH ADDITIONAL VESSEL  06/20/2019   IR ANGIOGRAM SELECTIVE EACH ADDITIONAL VESSEL  06/20/2019   IR ANGIOGRAM VISCERAL SELECTIVE  10/27/2017   IR ANGIOGRAM VISCERAL SELECTIVE  11/06/2017   IR ANGIOGRAM VISCERAL SELECTIVE  12/04/2017   IR ANGIOGRAM VISCERAL SELECTIVE  05/06/2018   IR  ANGIOGRAM VISCERAL SELECTIVE  06/07/2019   IR ANGIOGRAM VISCERAL SELECTIVE  06/20/2019   IR EMBO ARTERIAL NOT HEMORR HEMANG INC GUIDE ROADMAPPING  10/27/2017   IR EMBO TUMOR ORGAN ISCHEMIA INFARCT INC GUIDE ROADMAPPING  11/06/2017   IR EMBO TUMOR ORGAN ISCHEMIA INFARCT INC GUIDE ROADMAPPING  12/04/2017   IR EMBO TUMOR ORGAN ISCHEMIA INFARCT INC GUIDE ROADMAPPING  05/06/2018   IR EMBO TUMOR ORGAN ISCHEMIA INFARCT INC GUIDE ROADMAPPING  06/20/2019   IR PARACENTESIS  09/16/2019   IR RADIOLOGIST EVAL & MGMT  08/12/2017   IR RADIOLOGIST EVAL & MGMT  09/22/2017   IR RADIOLOGIST EVAL & MGMT  01/07/2018   IR RADIOLOGIST EVAL & MGMT  04/20/2018   IR RADIOLOGIST EVAL & MGMT  11/18/2018   IR RADIOLOGIST EVAL & MGMT  05/12/2019   IR RADIOLOGIST EVAL & MGMT  07/12/2019   IR US GUIDE VASC ACCESS RIGHT  10/27/2017   IR US GUIDE VASC ACCESS RIGHT  11/06/2017   IR US GUIDE VASC ACCESS RIGHT  12/04/2017   IR US GUIDE VASC ACCESS RIGHT  05/06/2018   IR US GUIDE [place] RIGHT  06/07/2019   IR US GUIDE VASC ACCESS RIGHT  06/20/2019   NM MYOVIEW LTD  2012    I have reviewed the social history and family history with the patient and  they are unchanged from previous note.  ALLERGIES:  is allergic to statins.  MEDICATIONS:  Current Outpatient Medications  Medication Sig Dispense Refill   albuterol (PROVENTIL HFA;VENTOLIN HFA) 108 (90 Base) MCG/ACT inhaler Inhale 2 puffs into the lungs every 6 (six) hours as needed for wheezing or shortness of breath. (Patient not taking: Reported on 09/15/2019) 1 Inhaler 6   aspirin 81 MG tablet Take 81 mg by mouth daily.     Cholecalciferol (VITAMIN D3 PO) Take 125 mcg by mouth daily.     dexlansoprazole (DEXILANT) 60 MG capsule Take 1 capsule (60 mg total) by mouth daily. 15 capsule 0   lactulose (CHRONULAC) 10 GM/15ML solution Take 30 mLs (20 g total) by mouth 4 (four) times daily as needed. 236 mL 1   LORazepam (ATIVAN) 0.5 MG tablet Take 1 tablet (0.5 mg)  approximately 30 minutes prior to MRI. If still anxious, may take additional 1 tablet (0.5 mg). (Patient not taking: Reported on 09/15/2019) 2 tablet 0   mirtazapine (REMERON) 7.5 MG tablet Take 1 tablet (7.5 mg total) by mouth at bedtime. (Patient not taking: Reported on 09/15/2019) 30 tablet 2   mometasone-formoterol (DULERA) 200-5 MCG/ACT AERO Inhale 2 puffs into the lungs 2 (two) times daily.     Multiple Vitamin (MULTIVITAMIN WITH MINERALS) TABS tablet Take 1 tablet by mouth daily.     ondansetron (ZOFRAN) 8 MG tablet Take 1 tablet (8 mg total) by mouth every 8 (eight) hours as needed for nausea or vomiting. (Patient not taking: Reported on 09/15/2019) 20 tablet 0   oxyCODONE (OXY IR/ROXICODONE) 5 MG immediate release tablet Take 0.5-1 tablets (2.5-5 mg total) by mouth every 6 (six) hours as needed for severe pain. 30 tablet 0   pantoprazole (PROTONIX) 40 MG tablet 40 mg one tab 30 minutes before breakfast meal (Patient not taking: Reported on 09/15/2019) 90 tablet 3   Turmeric (QC TUMERIC COMPLEX) 500 MG CAPS Take by mouth daily.     zinc gluconate 50 MG tablet Take 50 mg by mouth daily.     No current facility-administered medications for this visit.    PHYSICAL EXAMINATION: ECOG PERFORMANCE STATUS: 3 - Symptomatic, >50% confined to bed  Vitals:   09/30/19 1137  BP: 102/76  Pulse: (!) 129  Resp: (!) 24  Temp: 97.7 F (36.5 C)  SpO2: 97%   Filed Weights   09/30/19 1137  Weight: 133 lb 4.8 oz (60.5 kg)    GENERAL:alert, no distress and comfortable SKIN: skin color, texture, turgor are normal, no rashes or significant lesions EYES: normal, Conjunctiva are pink and non-injected, sclera clear  NECK: supple, thyroid normal size, non-tender, without nodularity LYMPH:  no palpable lymphadenopathy in the cervical, axillary  LUNGS: clear to auscultation and percussion with normal breathing effort HEART: regular rate & rhythm and no murmurs and no lower extremity  edema ABDOMEN:abdomen soft, non-tender and normal bowel sounds (+) Abdominal ascites, bloating Musculoskeletal:no cyanosis of digits and no clubbing  NEURO: alert & oriented x 3 with fluent speech, no focal motor/sensory deficits  LABORATORY DATA:  I have reviewed the data as listed CBC Latest Ref Rng & Units 09/30/2019 09/15/2019 08/03/2019  WBC 4.0 - 10.5 K/uL 5.1 5.0 4.2  Hemoglobin 13.0 - 17.0 g/dL 12.8(L) 12.2(L) 12.8(L)  Hematocrit 39 - 52 % 35.2(L) 33.2(L) 36.5(L)  Platelets 150 - 400 K/uL 65(L) 58(L) 53(L)     CMP Latest Ref Rng & Units 09/30/2019 09/15/2019 08/03/2019  Glucose 70 - 99 mg/dL 104(H) 127(H) 107(H)  BUN 8 - 23 mg/dL 6(L) 9 <4(L)  Creatinine 0.61 - 1.24 mg/dL 0.96 1.03 0.73  Sodium 135 - 145 mmol/L 134(L) 134(L) 140  Potassium 3.5 - 5.1 mmol/L 4.0 3.2(L) 3.7  Chloride 98 - 111 mmol/L 105 101 106  CO2 22 - 32 mmol/L 18(L) 19(L) 25  Calcium 8.9 - 10.3 mg/dL 8.8(L) 8.1(L) 8.1(L)  Total Protein 6.5 - 8.1 g/dL 8.5(H) 7.9 7.4  Total Bilirubin 0.3 - 1.2 mg/dL 4.3(HH) 5.9(HH) 3.0(H)  Alkaline Phos 38 - 126 U/L 140(H) 125 129(H)  AST 15 - 41 U/L 53(H) 56(H) 86(H)  ALT 0 - 44 U/L 26 31 36      RADIOGRAPHIC STUDIES: I have personally reviewed the radiological images as listed and agreed with the findings in the report. No results found.   ASSESSMENT & PLAN:  Zachary Steele is a 69 y.o. male with    1. Symptom Management: Lower abdominal pain, Mild constipation, Tachycardia -Today he is tachycardic (pusle 129), and notes dizziness upon standing. He notes drinking a lot of fluids.  -On exam he had recurrent ascites. He has more fatigue, constant lower abdominal pain and bowel cramps without BM. He has mild constipation but able to have BM.  -I recommend another paracentesis next week, he is agreeable.  -He has only taken ibuprofen 1 tabs as needed for pain. His pain is constant 5/10. I will call in low dose Oxycodone for him to start at half tablet once as needed  (09/30/19). He can continue ibuprofen as needed.  -I discussed pain medication can lead to constipation. He is on Lactulose which he will continue. If not enough he can use Miralax up to 6-8 times a day to have a BM.  -Given ascites and he is drinking enough water, will hold IV Fluids today.    2. Hepatocellular Carcinoma, multifocal, cT3N0Mx -He was diagnosed in 08/2017 given he has at least 4 separate hepatic masses with the largest measuring 5.5 cm with typical imaging features of HCC. AFP is elevated to 2431;this is diagnostic forhepatocellular carcinoma. -Due to the size and multifocal disease he is not a candidate for liver transplant or resection.His liver cancer is not curableat this stage, but still treatable. -He underwentbilateral hepatic arteryY90 treatments on 11/06/17 and 12/04/17 with Dr. Annamaria Boots. He tolerated the procedure very well but had mixed response. -On 05/06/18 he underwent right hepatic bland embolization by Dr. Annamaria Boots. He was on Observation since. -His1/11/2021liver MRIshoweddisease progression with new lesions in the left lobe,stable disease in the right lobe. No extrahepatic notable distant metastasis.  -He underwent Y90 embolization to left lobe liver on 06/20/19 with IR Dr Annamaria Boots  -He has had ongoing and worsened symptoms of taste change, appetite loss, weight loss and has persistent occasional dizziness. I discussed his symptoms are likely related to his liver cancer.  -His CT scan from 07/12/19 showed overall stable disease with some mild improvement in multifocal liver cancer.  His response to latest Y90 may not last much longer.  -I started him on systemic treatment with goal to control his disease. He started IV Immunotherapy Tecentriq and biological agent Avastin q3weeks on 08/11/19. First cycle was with Tecentriq alone and plan to add Avastin with C2.  -After Cycle 1 he developed worsening LFTs and decreased performance status. His ascites presented with benign  cells per cytology after 09/16/19 Paracentesis. He was given lactulose for treatment.  -His ascites has returned on exam today (09/30/19), abdominal pain more persistent and performance status remains low.  Labs reviewed, Hg 12.8, plt 65K, Ca 8.8, protein 8.5, albumin 2, AST 53, Alk Phos 140, Tbili 4.3.  -I discussed his cancer appears to be progressing faster now and given current liver dysfunction, poor PS, I do not think more chemo treatment is beneficial and I recommend stopping current therapy.  I again discussed the incurable and aggressive nature of his liver cancer. -I recommend hospice home care to help manage his pain and symptoms. He is eligible since he is not on treatment. I discussed the benefit and the logistics of hospice service, including residential hospice towards the end. He can withdraw service at any time, especially if his condition improves. He is agreeable to proceed with Hospice after next Paracentesis.  -[place] will continue to [place] care. F/u open.    3. Fatigue, Taste change, anorexia and weight loss  -Has been ongoing for the past in the past few months.  -He has not been able to return to work since [place] due to fatigue.  -Based on his MRI symptoms are likely related to disease progression but s/p latest Y90 his symptoms have worsened.  -He also notes years of occasional dizziness and near syncope. This still occurs. Will monitor.  -He was previously prescribed Mirtazapine 7.57m, but not currently taking it.  -He continues to have small appetite, but weight stable.    4. Cirrhosis, Chronic Hepatitis C -He is followed by PCP and [facility] -[facility] recommends Hep A and Hep B vaccination series -11/2017 Endoscopy shows smallEsophagealvarices, no gastric varices. Plan to repeat in 11/2019. I recommend he repeat sooner to start avastin with systemic treatment.  -We recommend he continue to  abstain from alcohol to prevent further liver damage and try to quit smoking.   5. COPD, Smoking Cessation, Cough -He has some intermittent SOBand cough, likely related to his smoking.PCP started him on inhaler -His PCP started him on inhaleras needed for SOB.He has developed a cough. -He has reduced down toa few cigarettes a day, Iagainencouraged him to complete cessation.  6. Goal of care discussion, DNR  -We discussed his guarded prognosis -I recommend DNR, patient agreed.   PLAN: -Paracentesis next week  -Send hospice referral to start next week after paracentesis  -with pt's permission, I spoke with his daughter after his visit today , and answered her questions    No problem-specific Assessment & Plan notes found for this encounter.   Orders Placed This Encounter  Procedures   [place]    Standing Status:   Future    Standing Expiration Date:   09/29/2020    Order Specific Question:   If therapeutic, is there a maximum amount of fluid to be removed?    Answer:   Yes    Order Specific Question:   What is the maximum amount of fluide to be removed?    Answer:   5L    Order Specific Question:   Are labs required for specimen collection?    Answer:   No    Order Specific Question:   Is Albumin medication needed?    Answer:   No    Order Specific Question:   Reason for Exam (SYMPTOM  OR DIAGNOSIS REQUIRED)    Answer:   symptom relieve    Order Specific Question:   Preferred imaging location?    Answer:   [facility]  All questions were answered. The patient knows to call  the clinic with any problems, questions or concerns. No barriers to learning was detected. The total time spent in the appointment was 50 minutes.     Truitt Merle, MD 09/30/2019   I, Joslyn Devon, am acting as scribe for Truitt Merle, MD.   I have reviewed the above documentation for accuracy and completeness, and I agree with the above.

## 2019-09-28 NOTE — Telephone Encounter (Signed)
I spoke with Mrs Zachary Steele this am.  She did not know about Mr Zachary Steele's appts last Friday.  His appt have been scheduled for this Friday 09/30/2019.  [name] verbalized understanding.

## 2019-09-29 ENCOUNTER — Telehealth: Payer: Self-pay | Admitting: Hematology

## 2019-09-29 NOTE — Telephone Encounter (Signed)
Scheduled per 6/17 sch message. Messaged RN Santiago Glad to give pt wife a call.

## 2019-09-30 ENCOUNTER — Telehealth: Payer: Self-pay

## 2019-09-30 ENCOUNTER — Other Ambulatory Visit: Payer: Self-pay

## 2019-09-30 ENCOUNTER — Inpatient Hospital Stay: Payer: Medicare HMO

## 2019-09-30 ENCOUNTER — Encounter: Payer: Self-pay | Admitting: Hematology

## 2019-09-30 ENCOUNTER — Inpatient Hospital Stay (HOSPITAL_BASED_OUTPATIENT_CLINIC_OR_DEPARTMENT_OTHER): Payer: Medicare HMO | Admitting: Hematology

## 2019-09-30 VITALS — BP 102/76 | HR 129 | Temp 97.7°F | Resp 24 | Wt 133.3 lb

## 2019-09-30 DIAGNOSIS — K7460 Unspecified cirrhosis of liver: Secondary | ICD-10-CM | POA: Diagnosis not present

## 2019-09-30 DIAGNOSIS — R188 Other ascites: Secondary | ICD-10-CM | POA: Diagnosis not present

## 2019-09-30 DIAGNOSIS — C220 Liver cell carcinoma: Secondary | ICD-10-CM

## 2019-09-30 DIAGNOSIS — B182 Chronic viral hepatitis C: Secondary | ICD-10-CM | POA: Diagnosis not present

## 2019-09-30 DIAGNOSIS — J449 Chronic obstructive pulmonary disease, unspecified: Secondary | ICD-10-CM | POA: Diagnosis not present

## 2019-09-30 DIAGNOSIS — R1030 Lower abdominal pain, unspecified: Secondary | ICD-10-CM | POA: Diagnosis not present

## 2019-09-30 DIAGNOSIS — R000 Tachycardia, unspecified: Secondary | ICD-10-CM | POA: Diagnosis not present

## 2019-09-30 DIAGNOSIS — Z66 Do not resuscitate: Secondary | ICD-10-CM | POA: Diagnosis not present

## 2019-09-30 DIAGNOSIS — Z79899 Other long term (current) drug therapy: Secondary | ICD-10-CM | POA: Diagnosis not present

## 2019-09-30 LAB — CBC WITH DIFFERENTIAL (CANCER CENTER ONLY)
Abs Immature Granulocytes: 0.01 10*3/uL (ref 0.00–0.07)
Basophils Absolute: 0 10*3/uL (ref 0.0–0.1)
Basophils Relative: 1 %
Eosinophils Absolute: 0.1 10*3/uL (ref 0.0–0.5)
Eosinophils Relative: 1 %
HCT: 35.2 % — ABNORMAL LOW (ref 39.0–52.0)
Hemoglobin: 12.8 g/dL — ABNORMAL LOW (ref 13.0–17.0)
Immature Granulocytes: 0 %
Lymphocytes Relative: 19 %
Lymphs Abs: 1 10*3/uL (ref 0.7–4.0)
MCH: 38.4 pg — ABNORMAL HIGH (ref 26.0–34.0)
MCHC: 36.4 g/dL — ABNORMAL HIGH (ref 30.0–36.0)
MCV: 105.7 fL — ABNORMAL HIGH (ref 80.0–100.0)
Monocytes Absolute: 0.5 10*3/uL (ref 0.1–1.0)
Monocytes Relative: 9 %
Neutro Abs: 3.5 10*3/uL (ref 1.7–7.7)
Neutrophils Relative %: 70 %
Platelet Count: 65 10*3/uL — ABNORMAL LOW (ref 150–400)
RBC: 3.33 MIL/uL — ABNORMAL LOW (ref 4.22–5.81)
RDW: 15.5 % (ref 11.5–15.5)
WBC Count: 5.1 10*3/uL (ref 4.0–10.5)
nRBC: 0 % (ref 0.0–0.2)

## 2019-09-30 LAB — CMP (CANCER CENTER ONLY)
ALT: 26 U/L (ref 0–44)
AST: 53 U/L — ABNORMAL HIGH (ref 15–41)
Albumin: 2 g/dL — ABNORMAL LOW (ref 3.5–5.0)
Alkaline Phosphatase: 140 U/L — ABNORMAL HIGH (ref 38–126)
Anion gap: 11 (ref 5–15)
BUN: 6 mg/dL — ABNORMAL LOW (ref 8–23)
CO2: 18 mmol/L — ABNORMAL LOW (ref 22–32)
Calcium: 8.8 mg/dL — ABNORMAL LOW (ref 8.9–10.3)
Chloride: 105 mmol/L (ref 98–111)
Creatinine: 0.96 mg/dL (ref 0.61–1.24)
GFR, Est AFR Am: >60 mL/min (ref >60)
GFR, Estimated: >60 mL/min (ref >60)
Glucose, Bld: 104 mg/dL — ABNORMAL HIGH (ref 70–99)
Potassium: 4 mmol/L (ref 3.5–5.1)
Sodium: 134 mmol/L — ABNORMAL LOW (ref 135–145)
Total Bilirubin: 4.3 mg/dL (ref 0.3–1.2)
Total Protein: 8.5 g/dL — ABNORMAL HIGH (ref 6.5–8.1)

## 2019-09-30 MED ORDER — OXYCODONE HCL 5 MG PO TABS: 2.5000 mg | ORAL_TABLET | Freq: Four times a day (QID) | ORAL | 0 refills | Status: AC | PRN

## 2019-09-30 NOTE — Telephone Encounter (Signed)
Referral, ov note from 09/30/2019 and demographics sheet faxed to [facility] at [phone].

## 2019-09-30 NOTE — Telephone Encounter (Signed)
Mr Mcbeth's daughter Norvel Richards called requesting a return call to discuss her father's condition.  Please call at [phone].

## 2019-09-30 NOTE — Progress Notes (Signed)
Critical Value: T. Bili 4.3 Dr. Burr Medico notified

## 2019-10-03 ENCOUNTER — Telehealth: Payer: Self-pay | Admitting: Hematology

## 2019-10-03 NOTE — Telephone Encounter (Signed)
F/u open per 6/18 los. 

## 2019-10-04 ENCOUNTER — Telehealth: Payer: Self-pay | Admitting: *Deleted

## 2019-10-04 NOTE — Telephone Encounter (Signed)
Scheduled US paracentesis at [facility] for 6/25 at 1115/1130. Unable to reach patient, but was able to tell his wife of the appointment.

## 2019-10-05 ENCOUNTER — Other Ambulatory Visit: Payer: Self-pay | Admitting: Nurse Practitioner

## 2019-10-05 DIAGNOSIS — C220 Liver cell carcinoma: Secondary | ICD-10-CM

## 2019-10-07 ENCOUNTER — Ambulatory Visit (HOSPITAL_COMMUNITY)
Admission: RE | Admit: 2019-10-07 | Discharge: 2019-10-07 | Disposition: A | Discharge status: Home / Self Care | Source: Ambulatory Visit | Attending: Hematology | Admitting: Hematology

## 2019-10-07 ENCOUNTER — Other Ambulatory Visit: Payer: Self-pay

## 2019-10-07 DIAGNOSIS — R188 Other ascites: Secondary | ICD-10-CM | POA: Diagnosis not present

## 2019-10-07 DIAGNOSIS — C220 Liver cell carcinoma: Secondary | ICD-10-CM | POA: Insufficient documentation

## 2019-10-07 MED ORDER — LIDOCAINE HCL 1 % IJ SOLN
INTRAMUSCULAR | Status: AC
  Filled 2019-10-07: qty 20

## 2019-10-07 NOTE — Procedures (Signed)
PROCEDURE SUMMARY:  Successful US guided paracentesis from right lateral abdomen.  Yielded 4.8 liters of yellow fluid.  No immediate complications.  Pt tolerated well.   Specimen was not sent for labs.  EBL < 71mL  Docia Barrier PA-C 10/07/2019 12:13 PM

## 2019-10-21 ENCOUNTER — Ambulatory Visit: Payer: Medicare HMO

## 2019-10-21 ENCOUNTER — Ambulatory Visit: Payer: Medicare HMO | Admitting: Hematology

## 2019-10-21 ENCOUNTER — Other Ambulatory Visit: Payer: Medicare HMO

## 2019-11-08 ENCOUNTER — Telehealth: Payer: Self-pay | Admitting: Family Medicine

## 2019-11-08 NOTE — Telephone Encounter (Signed)
Sympathy card sent to family 

## 2019-11-13 DEATH — deceased

## 2019-12-16 ENCOUNTER — Other Ambulatory Visit: Payer: Self-pay

## 2019-12-16 NOTE — Patient Outreach (Signed)
[facility]) Care Management  12/16/2019  Zachary Steele Jun 29, 1950 [id number]   Case Closure   Per EMR patient expired.   Plan: RN CM will close case.   Enzo Montgomery, RN,BSN,CCM [place] Management Telephonic Care Management Coordinator Direct Phone: [phone] Toll Free: [phone] Fax: [phone]

## 2019-12-22 ENCOUNTER — Ambulatory Visit: Payer: Self-pay

## 2020-02-07 ENCOUNTER — Encounter: Payer: Medicare HMO | Admitting: Family Medicine
